# Patient Record
Sex: Female | Born: 1965 | Race: Black or African American | Hispanic: No | Marital: Married | Smoking: Former smoker
Health system: Southern US, Community
[De-identification: ages and names within clinical notes are randomized; demographics above are authoritative.]

## PROBLEM LIST (undated history)

## (undated) DIAGNOSIS — K219 Gastro-esophageal reflux disease without esophagitis: Secondary | ICD-10-CM

## (undated) DIAGNOSIS — M1990 Unspecified osteoarthritis, unspecified site: Secondary | ICD-10-CM

## (undated) DIAGNOSIS — I10 Essential (primary) hypertension: Secondary | ICD-10-CM

## (undated) DIAGNOSIS — E785 Hyperlipidemia, unspecified: Secondary | ICD-10-CM

## (undated) DIAGNOSIS — T884XXA Failed or difficult intubation, initial encounter: Secondary | ICD-10-CM

## (undated) HISTORY — PX: LUMBAR SPINE SURGERY: SHX701

## (undated) HISTORY — PX: BACK SURGERY: SHX140

## (undated) HISTORY — DX: Gastro-esophageal reflux disease without esophagitis: K21.9

## (undated) HISTORY — DX: Hyperlipidemia, unspecified: E78.5

## (undated) HISTORY — DX: Unspecified osteoarthritis, unspecified site: M19.90

## (undated) HISTORY — DX: Failed or difficult intubation, initial encounter: T88.4XXA

---

## 1993-08-18 HISTORY — PX: FINGER SURGERY: SHX640

## 1993-08-18 HISTORY — PX: HAND SURGERY: SHX662

## 2001-01-12 ENCOUNTER — Other Ambulatory Visit: Admission: RE | Admit: 2001-01-12 | Discharge: 2001-01-12 | Payer: Self-pay | Admitting: Obstetrics and Gynecology

## 2002-08-01 ENCOUNTER — Encounter: Payer: Self-pay | Admitting: Obstetrics and Gynecology

## 2002-08-01 ENCOUNTER — Ambulatory Visit (HOSPITAL_COMMUNITY): Admission: RE | Admit: 2002-08-01 | Discharge: 2002-08-01 | Payer: Self-pay | Admitting: Obstetrics and Gynecology

## 2002-09-13 ENCOUNTER — Encounter (HOSPITAL_COMMUNITY): Admission: RE | Admit: 2002-09-13 | Discharge: 2002-10-13 | Payer: Self-pay | Admitting: Neurosurgery

## 2005-12-16 ENCOUNTER — Ambulatory Visit: Payer: Self-pay | Admitting: Pain Medicine

## 2005-12-22 ENCOUNTER — Ambulatory Visit: Payer: Self-pay | Admitting: Pain Medicine

## 2005-12-24 ENCOUNTER — Ambulatory Visit (HOSPITAL_COMMUNITY): Admission: RE | Admit: 2005-12-24 | Discharge: 2005-12-24 | Payer: Self-pay | Admitting: Family Medicine

## 2006-01-22 ENCOUNTER — Ambulatory Visit: Payer: Self-pay | Admitting: Pain Medicine

## 2006-01-26 ENCOUNTER — Ambulatory Visit: Payer: Self-pay | Admitting: Pain Medicine

## 2006-03-03 ENCOUNTER — Ambulatory Visit: Payer: Self-pay | Admitting: Pain Medicine

## 2006-03-04 ENCOUNTER — Ambulatory Visit: Payer: Self-pay | Admitting: Pain Medicine

## 2006-04-14 ENCOUNTER — Ambulatory Visit: Payer: Self-pay | Admitting: Pain Medicine

## 2006-04-15 ENCOUNTER — Ambulatory Visit: Payer: Self-pay | Admitting: Pain Medicine

## 2006-05-28 ENCOUNTER — Ambulatory Visit: Payer: Self-pay | Admitting: Pain Medicine

## 2006-06-08 ENCOUNTER — Ambulatory Visit: Payer: Self-pay | Admitting: Pain Medicine

## 2007-11-03 ENCOUNTER — Ambulatory Visit (HOSPITAL_COMMUNITY): Admission: RE | Admit: 2007-11-03 | Discharge: 2007-11-03 | Payer: Self-pay | Admitting: Family Medicine

## 2009-10-16 ENCOUNTER — Emergency Department (HOSPITAL_COMMUNITY): Admission: EM | Admit: 2009-10-16 | Discharge: 2009-10-16 | Payer: Self-pay | Admitting: Emergency Medicine

## 2011-05-26 ENCOUNTER — Encounter: Payer: Self-pay | Admitting: Emergency Medicine

## 2011-05-26 ENCOUNTER — Emergency Department (HOSPITAL_COMMUNITY)
Admission: EM | Admit: 2011-05-26 | Discharge: 2011-05-26 | Disposition: A | Discharge status: Home / Self Care | Payer: PRIVATE HEALTH INSURANCE | Attending: Emergency Medicine | Admitting: Emergency Medicine

## 2011-05-26 ENCOUNTER — Emergency Department (HOSPITAL_COMMUNITY): Payer: PRIVATE HEALTH INSURANCE

## 2011-05-26 DIAGNOSIS — S93402A Sprain of unspecified ligament of left ankle, initial encounter: Secondary | ICD-10-CM

## 2011-05-26 DIAGNOSIS — M79609 Pain in unspecified limb: Secondary | ICD-10-CM | POA: Insufficient documentation

## 2011-05-26 DIAGNOSIS — X500XXA Overexertion from strenuous movement or load, initial encounter: Secondary | ICD-10-CM | POA: Insufficient documentation

## 2011-05-26 DIAGNOSIS — S93409A Sprain of unspecified ligament of unspecified ankle, initial encounter: Secondary | ICD-10-CM | POA: Insufficient documentation

## 2011-05-26 HISTORY — DX: Essential (primary) hypertension: I10

## 2011-05-26 NOTE — ED Provider Notes (Signed)
History     CSN: [id number] Arrival date & time: 05/26/2011  2:06 PM  Chief Complaint  Patient presents with  . Foot Pain    (Consider location/radiation/quality/duration/timing/severity/associated sxs/prior treatment) HPI Comments: Pt was walking downstairs and inverted L ankle yest.  No other injuries.  The history is provided by the patient. No language interpreter was used.    Past Medical History  Diagnosis Date  . Hypertension     History reviewed. No pertinent past surgical history.  No family history on file.  History  Substance Use Topics  . Smoking status: Never Smoker   . Smokeless tobacco: Not on file  . Alcohol Use: No    OB History    Grav Para Term Preterm Abortions TAB SAB Ect Mult Living                  Review of Systems  Musculoskeletal: Positive for arthralgias.  All other systems reviewed and are negative.    Allergies  Review of patient's allergies indicates no known allergies.  Home Medications   Current Outpatient Rx  Name Route Sig Dispense Refill  . AMITRIPTYLINE HCL 100 MG PO TABS Oral Take 50-100 mg by mouth at bedtime.      Marland Kitchen DILTIAZEM HCL CR 240 MG PO CP24 Oral Take 240 mg by mouth daily.      . ENALAPRIL MALEATE 10 MG PO TABS Oral Take 10 mg by mouth daily.      Marland Kitchen METOPROLOL TARTRATE 50 MG PO TABS Oral Take 50 mg by mouth 2 (two) times daily.      Marland Kitchen NAPROXEN 500 MG PO TABS Oral Take 500 mg by mouth 2 (two) times daily as needed. For pain       BP 135/87  Pulse 91  Temp(Src) 98.9 F (37.2 C) (Oral)  Resp 20  Ht 5\' 2"  (1.575 m)  Wt 175 lb (79.379 kg)  BMI 32.01 kg/m2  SpO2 99%  LMP 05/23/2011  Physical Exam  Nursing note and vitals reviewed. Constitutional: She is oriented to person, place, and time. Vital signs are normal. She appears well-developed and well-nourished. No distress.  HENT:  Head: Normocephalic and atraumatic.  Right Ear: External ear normal.  Left Ear: External ear normal.  Nose: Nose normal.    Mouth/Throat: No oropharyngeal exudate.  Eyes: Conjunctivae and EOM are normal. Pupils are equal, round, and reactive to light. Right eye exhibits no discharge. Left eye exhibits no discharge. No scleral icterus.  Neck: Normal range of motion. Neck supple. No JVD present. No tracheal deviation present. No thyromegaly present.  Cardiovascular: Normal rate, regular rhythm, normal heart sounds, intact distal pulses and normal pulses.  Exam reveals no gallop and no friction rub.   No murmur heard. Pulmonary/Chest: Effort normal and breath sounds normal. No stridor. No respiratory distress. She has no wheezes. She has no rales. She exhibits no tenderness.  Abdominal: Soft. Normal appearance and bowel sounds are normal. She exhibits no distension and no mass. There is no tenderness. There is no rebound and no guarding.  Musculoskeletal: She exhibits tenderness. She exhibits no edema.       Left ankle: She exhibits decreased range of motion and swelling. She exhibits no laceration and normal pulse. tenderness. Lateral malleolus tenderness found. Achilles tendon normal. Achilles tendon exhibits no pain, no defect and normal Thompson's test results.       Feet:  Lymphadenopathy:    She has no cervical adenopathy.  Neurological: She is alert and oriented  to person, place, and time. She has normal reflexes. Coordination normal. GCS eye subscore is 4. GCS verbal subscore is 5. GCS motor subscore is 6.  Skin: Skin is warm and dry. No rash noted. She is not diaphoretic.  Psychiatric: She has a normal mood and affect. Her speech is normal and behavior is normal. Judgment and thought content normal. Cognition and memory are normal.    ED Course  Procedures (including critical care time)  Labs Reviewed - No data to display Dg Ankle Complete Left  05/26/2011  *RADIOLOGY REPORT*  Clinical Data: Left ankle pain, twist injury last night  LEFT ANKLE COMPLETE - 3+ VIEW  Comparison: None  Findings: Lateral soft  tissue swelling. Ankle mortise intact. No acute fracture, dislocation, or bone destruction. Mineralization normal. Small plantar calcaneal spur.  IMPRESSION: No acute osseous abnormalities. Small plantar calcaneal spur.  Original Report Authenticated By: Lollie Marrow, M.D.     No diagnosis found.    MDM          Worthy Rancher, PA 05/26/11 1538

## 2011-05-26 NOTE — ED Notes (Signed)
States she twisted L ankle last night. Swelling noted.

## 2011-05-28 NOTE — ED Provider Notes (Signed)
Medical screening examination/treatment/procedure(s) were performed by non-physician practitioner and as supervising physician I was immediately available for consultation/collaboration.  Doug Sou, MD 05/28/11 1145

## 2012-01-30 ENCOUNTER — Other Ambulatory Visit (HOSPITAL_COMMUNITY): Payer: Self-pay | Admitting: Family Medicine

## 2012-01-30 DIAGNOSIS — Z139 Encounter for screening, unspecified: Secondary | ICD-10-CM

## 2012-02-02 ENCOUNTER — Inpatient Hospital Stay (HOSPITAL_COMMUNITY): Admission: RE | Admit: 2012-02-02 | Payer: PRIVATE HEALTH INSURANCE | Source: Ambulatory Visit

## 2012-12-15 ENCOUNTER — Emergency Department (HOSPITAL_COMMUNITY)
Admission: EM | Admit: 2012-12-15 | Discharge: 2012-12-15 | Disposition: A | Discharge status: Home / Self Care | Payer: Commercial Managed Care - PPO | Attending: Emergency Medicine | Admitting: Emergency Medicine

## 2012-12-15 ENCOUNTER — Encounter (HOSPITAL_COMMUNITY): Payer: Self-pay

## 2012-12-15 DIAGNOSIS — M545 Low back pain, unspecified: Secondary | ICD-10-CM | POA: Insufficient documentation

## 2012-12-15 DIAGNOSIS — F17200 Nicotine dependence, unspecified, uncomplicated: Secondary | ICD-10-CM | POA: Insufficient documentation

## 2012-12-15 DIAGNOSIS — Z79899 Other long term (current) drug therapy: Secondary | ICD-10-CM | POA: Insufficient documentation

## 2012-12-15 DIAGNOSIS — Y939 Activity, unspecified: Secondary | ICD-10-CM | POA: Insufficient documentation

## 2012-12-15 DIAGNOSIS — IMO0002 Reserved for concepts with insufficient information to code with codable children: Secondary | ICD-10-CM | POA: Insufficient documentation

## 2012-12-15 DIAGNOSIS — I10 Essential (primary) hypertension: Secondary | ICD-10-CM | POA: Insufficient documentation

## 2012-12-15 DIAGNOSIS — X58XXXA Exposure to other specified factors, initial encounter: Secondary | ICD-10-CM | POA: Insufficient documentation

## 2012-12-15 DIAGNOSIS — S76911A Strain of unspecified muscles, fascia and tendons at thigh level, right thigh, initial encounter: Secondary | ICD-10-CM

## 2012-12-15 DIAGNOSIS — Y9289 Other specified places as the place of occurrence of the external cause: Secondary | ICD-10-CM | POA: Insufficient documentation

## 2012-12-15 MED ORDER — BACLOFEN 10 MG PO TABS: 10.0000 mg | ORAL_TABLET | Freq: Three times a day (TID) | ORAL | Status: AC

## 2012-12-15 MED ORDER — PREDNISONE 10 MG PO TABS: ORAL_TABLET | ORAL | Status: DC

## 2012-12-15 MED ORDER — HYDROCODONE-ACETAMINOPHEN 5-325 MG PO TABS: 1.0000 | ORAL_TABLET | ORAL | Status: DC | PRN

## 2012-12-15 MED ORDER — BACLOFEN 10 MG PO TABS: 10.0000 mg | ORAL_TABLET | Freq: Three times a day (TID) | ORAL | Status: DC

## 2012-12-15 NOTE — ED Notes (Signed)
Pt c/o pain in r inner thigh radiating up to r hip since Sunday.  Denies any injury.

## 2012-12-15 NOTE — ED Notes (Signed)
Pt resting in bed, lights turned down in exam room, offered something to drink.  Declined at this time.

## 2012-12-15 NOTE — ED Provider Notes (Signed)
History     CSN: [id number]  Arrival date & time 12/15/12  4098   First MD Initiated Contact with Patient 12/15/12 1001      Chief Complaint  Patient presents with  . Leg Pain    (Consider location/radiation/quality/duration/timing/severity/associated sxs/prior treatment) Patient is a 47 y.o. female presenting with leg pain. The history is provided by the patient.  Leg Pain Location:  Leg Time since incident:  3 days Injury: no   Leg location:  R leg Pain details:    Quality:  Aching and sharp   Radiates to:  Back   Severity:  Moderate   Onset quality:  Gradual   Duration:  3 days   Timing:  Constant   Progression:  Worsening Chronicity:  Recurrent Dislocation: no   Foreign body present:  No foreign bodies Prior injury to area:  No Relieved by:  Nothing Worsened by:  Bearing weight Ineffective treatments:  NSAIDs Associated symptoms: back pain   Associated symptoms: no neck pain     Past Medical History  Diagnosis Date  . Hypertension     History reviewed. No pertinent past surgical history.  No family history on file.  History  Substance Use Topics  . Smoking status: Current Every Day Smoker    Types: Cigarettes  . Smokeless tobacco: Not on file  . Alcohol Use: No    OB History   Grav Para Term Preterm Abortions TAB SAB Ect Mult Living                  Review of Systems  Constitutional: Negative for activity change.       All ROS Neg except as noted in HPI  HENT: Negative for nosebleeds and neck pain.   Eyes: Negative for photophobia and discharge.  Respiratory: Negative for cough, shortness of breath and wheezing.   Cardiovascular: Negative for chest pain and palpitations.  Gastrointestinal: Negative for abdominal pain and blood in stool.  Genitourinary: Negative for dysuria, frequency and hematuria.  Musculoskeletal: Positive for back pain. Negative for arthralgias.  Skin: Negative.   Neurological: Negative for dizziness, seizures and  speech difficulty.  Psychiatric/Behavioral: Negative for hallucinations and confusion.    Allergies  Review of patient's allergies indicates no known allergies.  Home Medications   Current Outpatient Rx  Name  Route  Sig  Dispense  Refill  . amitriptyline (ELAVIL) 100 MG tablet   Oral   Take 100 mg by mouth at bedtime.          Marland Kitchen diltiazem (DILACOR XR) 240 MG 24 hr capsule   Oral   Take 240 mg by mouth daily.           . enalapril (VASOTEC) 10 MG tablet   Oral   Take 10 mg by mouth daily.           . metoprolol (LOPRESSOR) 50 MG tablet   Oral   Take 50 mg by mouth 2 (two) times daily.             BP 174/101  Pulse 99  Temp(Src) 98.2 F (36.8 C) (Oral)  Resp 17  SpO2 100%  LMP 10/15/2012  Physical Exam  Nursing note and vitals reviewed. Constitutional: She is oriented to person, place, and time. She appears well-developed and well-nourished.  Non-toxic appearance.  HENT:  Head: Normocephalic.  Right Ear: Tympanic membrane and external ear normal.  Left Ear: Tympanic membrane and external ear normal.  Eyes: EOM and lids are normal. Pupils are  equal, round, and reactive to light.  Neck: Normal range of motion. Neck supple. Carotid bruit is not present.  Cardiovascular: Normal rate, regular rhythm, normal heart sounds, intact distal pulses and normal pulses.   Pulmonary/Chest: Breath sounds normal. No respiratory distress.  Abdominal: Soft. Bowel sounds are normal. There is no tenderness. There is no guarding.  Musculoskeletal: Normal range of motion.  Pain to palpation of the right inner thigh. No dislocation of the right hip. FROM of the right knee and ankle.  Mild to mod pain to the lower lumbar area. No hot areas noted.  Lymphadenopathy:       Head (right side): No submandibular adenopathy present.       Head (left side): No submandibular adenopathy present.    She has no cervical adenopathy.  Neurological: She is alert and oriented to person, place,  and time. She has normal strength. No cranial nerve deficit or sensory deficit. She exhibits normal muscle tone. Coordination normal.  Skin: Skin is warm and dry.  Psychiatric: She has a normal mood and affect. Her speech is normal.    ED Course  Procedures (including critical care time)  Labs Reviewed - No data to display No results found.   No diagnosis found.    MDM  I have reviewed nursing notes, vital signs, and all appropriate lab and imaging results for this patient. Pt reports pain in the right inner thigh and right hip for 3 days. 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 No gross neuro deficit noted. Plan - Rx for baclofen, prednisone, and norco. Pt to apply heat. She will see her primary MD for follow up if not improving.       Kathie Dike, PA-C 12/15/12 1141

## 2012-12-15 NOTE — ED Provider Notes (Signed)
Medical screening examination/treatment/procedure(s) were performed by non-physician practitioner and as supervising physician I was immediately available for consultation/collaboration.    Vida Roller, MD 12/15/12 [id number]

## 2012-12-15 NOTE — ED Notes (Signed)
Pt reports right inner thigh pain for several days, denies any known injury, pain radiates to right hip.  Is also out of her bp meds due to not having  her pmd.

## 2012-12-29 ENCOUNTER — Ambulatory Visit
Admission: RE | Admit: 2012-12-29 | Discharge: 2012-12-29 | Disposition: A | Discharge status: Home / Self Care | Payer: Commercial Managed Care - PPO | Source: Ambulatory Visit | Attending: Physician Assistant | Admitting: Physician Assistant

## 2012-12-29 ENCOUNTER — Encounter: Payer: Self-pay | Admitting: Physician Assistant

## 2012-12-29 ENCOUNTER — Ambulatory Visit (INDEPENDENT_AMBULATORY_CARE_PROVIDER_SITE_OTHER): Payer: Commercial Managed Care - PPO | Admitting: Physician Assistant

## 2012-12-29 VITALS — BP 204/116 | HR 96 | Temp 98.4°F | Resp 20 | Ht 61.0 in | Wt 172.0 lb

## 2012-12-29 DIAGNOSIS — M25559 Pain in unspecified hip: Secondary | ICD-10-CM

## 2012-12-29 DIAGNOSIS — M545 Low back pain: Secondary | ICD-10-CM

## 2012-12-29 DIAGNOSIS — M1990 Unspecified osteoarthritis, unspecified site: Secondary | ICD-10-CM | POA: Insufficient documentation

## 2012-12-29 DIAGNOSIS — M25551 Pain in right hip: Secondary | ICD-10-CM

## 2012-12-29 DIAGNOSIS — I10 Essential (primary) hypertension: Secondary | ICD-10-CM

## 2012-12-29 DIAGNOSIS — G4700 Insomnia, unspecified: Secondary | ICD-10-CM

## 2012-12-29 DIAGNOSIS — F17200 Nicotine dependence, unspecified, uncomplicated: Secondary | ICD-10-CM

## 2012-12-29 MED ORDER — METOPROLOL TARTRATE 50 MG PO TABS: 50.0000 mg | ORAL_TABLET | Freq: Two times a day (BID) | ORAL | Status: DC

## 2012-12-29 MED ORDER — AMLODIPINE BESYLATE 5 MG PO TABS: 5.0000 mg | ORAL_TABLET | Freq: Every day | ORAL | Status: DC

## 2012-12-29 MED ORDER — ENALAPRIL MALEATE 10 MG PO TABS: 10.0000 mg | ORAL_TABLET | Freq: Every day | ORAL | Status: DC

## 2012-12-29 MED ORDER — ZOLPIDEM TARTRATE 5 MG PO TABS: 5.0000 mg | ORAL_TABLET | Freq: Every evening | ORAL | Status: DC | PRN

## 2012-12-29 NOTE — Progress Notes (Signed)
Called in RX d/t to error whilel processing escribe

## 2012-12-29 NOTE — Progress Notes (Signed)
Patient ID: Donna Fitzgerald MRN: [id number], DOB: 12-29-1965, 47 y.o. Date of Encounter: @DATE @  Chief Complaint:  Chief Complaint  Patient presents with  . new pt  needs estab care  work PE    HPI: 47 y.o. year old female  Presents to establish care at this practice. She needs to address the following:  1: HTN: She has a h/o HTN and was on multiple meds for this but all medications ran out 2 months ago.  2. Low Back Pain: went to ER 12/15/12 sec to pain in right thigh. Says they did no XRay. Treated with prednisone and pain pill. Says it was some better while on pain pill but o/w has continued to have symptoms. Says she had no trauma or injury at the time of onset of symptoms. States that she has a sensation in medial right upper thigh as if bugs crawling on hte skin. At times also feels a discomfort from right thigh to right low back.  **Works as Lawyer with patient care** 3. Insomnia; States she has difficulty falling asleep. Once she is asleep, has no problems staying asleep. Was using Amitryptilline for this but it is not helping.  4. Smoker: Has decreased to 2 cigarettes per week.    Past Medical History  Diagnosis Date  . Arthritis   . Hypertension     SHE HAS BEEN OFF ALL MEDS FOR 2 MONTHS  Home Meds: See attached medication section for current medication list. Any medications entered into computer today will not appear on this note's list. The medications listed below were entered prior to today. Current Outpatient Prescriptions on File Prior to Visit  Medication Sig Dispense Refill  .      .      .       No current facility-administered medications on file prior to visit.     Allergies: No Known Allergies  Works as Lawyer.  History   Social History  . Marital Status: Divorced    Spouse Name: N/A    Number of Children: N/A  . Years of Education: N/A   Occupational History  . Not on file.   Social History Main Topics  . Smoking status: Current Every Day Smoker  -- 0.50 packs/day    Types: Cigarettes  . Smokeless tobacco: Never Used  . Alcohol Use: No  . Drug Use: No  . Sexually Active: Not on file   Other Topics Concern  . Not on file   Social History Narrative  . No narrative on file    Family History  Problem Relation Age of Onset  . Hypertension Mother   . Heart disease Father   . Hypertension Father   . Heart disease Sister   . Hypertension Sister      Review of Systems:  See HPI for pertinent ROS. All other ROS negative.    Physical Exam: Blood pressure 204/116, pulse 96, temperature 98.4 F (36.9 C), temperature source Oral, resp. rate 20, height 5\' 1"  (1.549 m), weight 172 lb (78.019 kg), last menstrual period 10/15/2012., Body mass index is 32.52 kg/(m^2). General:Obese AAF. Appears in no acute distress. Neck: Supple. No thyromegaly. No lymphadenopathy. Lungs: Clear bilaterally to auscultation without wheezes, rales, or rhonchi. Breathing is unlabored. Heart: RRR with S1 S2. No murmurs, rubs, or gallops. Musculoskeletal:  Strength and tone normal for age.  Right Low Back with no pain with palpation. No pain with palpation of sciatic notch.  Bilateral SLR and Hip Abduction normal. Patella  DTRs 2+, equal bilaterally.  Extremities/Skin: Warm and dry. No clubbing or cyanosis. No edema. No rashes or suspicious lesions. Neuro: Alert and oriented X 3. Moves all extremities spontaneously. Gait is normal. CNII-XII grossly in tact. Psych:  Responds to questions appropriately with a normal affect.     ASSESSMENT AND PLAN:  47 y.o. year old female with  1. Hypertension She was on enalapril, lopressor, and diltiazem in past. Will resume enalapril and lopressor but change diltiazem to norvasc. RTC to recheck BP and BMET in 2 weeks on meds.  - enalapril (VASOTEC) 10 MG tablet; Take 1 tablet (10 mg total) by mouth daily.  Dispense: 30 tablet; Refill: 2 - metoprolol (LOPRESSOR) 50 MG tablet; Take 1 tablet (50 mg total) by mouth 2 (two)  times daily.  Dispense: 60 tablet; Refill: 2 - amLODipine (NORVASC) 5 MG tablet; Take 1 tablet (5 mg total) by mouth daily.  Dispense: 90 tablet; Refill: 3  2. Insomnia - zolpidem (AMBIEN) 5 MG tablet; Take 1 tablet (5 mg total) by mouth at bedtime as needed for sleep.  Dispense: 15 tablet; Refill: 1  3. Smoker Cont cessation  4. Low back pain - DG Lumbar Spine Complete; Future  5. Right hip pain - DG Hip Complete Right; Future  Will obtain XRays of lumbar spine and right hip. Will f/u with pt regarding this issue when XRay results available.   [address] [place], [place], [facility] 12/29/2012 1:07 PM

## 2012-12-30 ENCOUNTER — Telehealth: Payer: Self-pay | Admitting: Family Medicine

## 2012-12-30 DIAGNOSIS — I10 Essential (primary) hypertension: Secondary | ICD-10-CM

## 2012-12-30 MED ORDER — AMLODIPINE BESYLATE 5 MG PO TABS: 5.0000 mg | ORAL_TABLET | Freq: Every day | ORAL | Status: DC

## 2012-12-30 MED ORDER — MELOXICAM 7.5 MG PO TABS: 7.5000 mg | ORAL_TABLET | Freq: Two times a day (BID) | ORAL | Status: DC

## 2012-12-30 NOTE — Telephone Encounter (Signed)
Message copied by Donne Anon on Thu Dec 30, 2012  2:46 PM ------      Message from: Allayne Butcher      Created: Wed Dec 29, 2012  4:53 PM       Hip XRay normal. XRay of lumbar spine shows findings c/w "wear and tear."-Degenerative Disk Disease. But no more severe findings. At the OV I told her I would wait to get XRay findings then Rx med for her symptoms.       Will use Mobic 7.5mg  bid with food.       Send in RX for this- # 60 / 1 refill. ------

## 2012-12-30 NOTE — Telephone Encounter (Signed)
Pt made aware of xray results.  Told to take Mobic as ordered.  She states her pharmacy never received Norvasc order.  Mobic and Norvasc order sent to pharmacy.  Pt does have follow up appt for 2 weeks

## 2013-01-05 ENCOUNTER — Ambulatory Visit: Payer: Self-pay | Admitting: Physician Assistant

## 2013-01-19 ENCOUNTER — Encounter: Payer: Self-pay | Admitting: Physician Assistant

## 2013-01-19 ENCOUNTER — Ambulatory Visit (INDEPENDENT_AMBULATORY_CARE_PROVIDER_SITE_OTHER): Payer: Commercial Managed Care - PPO | Admitting: Physician Assistant

## 2013-01-19 VITALS — BP 174/110 | HR 100 | Temp 98.1°F | Resp 18 | Wt 172.0 lb

## 2013-01-19 DIAGNOSIS — G4700 Insomnia, unspecified: Secondary | ICD-10-CM

## 2013-01-19 DIAGNOSIS — M545 Low back pain: Secondary | ICD-10-CM

## 2013-01-19 DIAGNOSIS — I10 Essential (primary) hypertension: Secondary | ICD-10-CM

## 2013-01-19 MED ORDER — ENALAPRIL MALEATE 20 MG PO TABS: 20.0000 mg | ORAL_TABLET | Freq: Every day | ORAL | Status: DC

## 2013-01-19 MED ORDER — ZOLPIDEM TARTRATE 10 MG PO TABS: 10.0000 mg | ORAL_TABLET | Freq: Every evening | ORAL | Status: DC | PRN

## 2013-01-19 MED ORDER — AMLODIPINE BESYLATE 10 MG PO TABS: 10.0000 mg | ORAL_TABLET | Freq: Every day | ORAL | Status: AC

## 2013-01-19 NOTE — Progress Notes (Signed)
Patient ID: Donna Fitzgerald MRN: [id number], DOB: [birth year], 47 y.o. Date of Encounter: @DATE @  Chief Complaint:  Chief Complaint  Patient presents with  . recheck BP    HPI: 47 y.o. year old female  presents for 2 week f/u.  She was seen as new patient here 46/14/2014. She is here to f/u the following issues that were addressed at that OV:  1- HTN: At the visit 12/29/12 she reported that she had a h/o HTN and had been on 3 meds for this. Howver, had run out of all meds 2 months prior to that OV.  At that OV BP was 204/116. Started Enalapril 10mg  QD, Norvasc 5mg  QD, Metoprolol 50 BID.  Today she reports that she has been taking all meds as directed. She takes meds at 7:30 a.m. Except today has not taken meds yet. Did take them yesterday at 7:30 am (plus night dose of metoprolol).  Now 9:00 am.  2- Insomnia: Tried Ambien 5 mg--says it didn't work. Saw no effect. No adv effect either.   Has old bottle of Amitriptylline 100mg  tabs --she takes 1/2 (50mg ) prn insomnia. No h/o anxiety or depression. Strictly uses this prn insomnia. This does work for her.  3- Hip and Low Back Pain: At LOV I ordered Hip and Lumbar spine XRays. Hip XRAy was normal. Lumbar spine XRay showed DDD. No other signif abn. I then ordered Mobic. She says this has helped. THe  symptoms in her thigh/leg have resolved and the low back pain is better but not completely resolved.    Past Medical History  Diagnosis Date  . Arthritis   . Hypertension      Home Meds: See attached medication section for current medication list. Any medications entered into computer today will not appear on this note's list. The medications listed below were entered prior to today. Current Outpatient Prescriptions on File Prior to Visit  Medication Sig Dispense Refill  . acetaminophen (TYLENOL) 650 MG CR tablet Take 650 mg by mouth every 8 (eight) hours as needed for pain. Takes two      . amitriptyline (ELAVIL) 100 MG tablet Take 100 mg by  mouth at bedtime.       Marland Kitchen HYDROcodone-acetaminophen (NORCO/VICODIN) 5-325 MG per tablet Take 1 tablet by mouth every 4 (four) hours as needed for pain.  20 tablet  0  . meloxicam (MOBIC) 7.5 MG tablet Take 1 tablet (7.5 mg total) by mouth 2 (two) times daily with a meal.  60 tablet  1  . metoprolol (LOPRESSOR) 50 MG tablet Take 1 tablet (50 mg total) by mouth 2 (two) times daily.  60 tablet  2   No current facility-administered medications on file prior to visit.    Allergies: No Known Allergies  History   Social History  . Marital Status: Divorced    Spouse Name: N/A    Number of Children: N/A  . Years of Education: N/A   Occupational History  . Not on file.   Social History Main Topics  . Smoking status: Current Every Day Smoker -- 0.50 packs/day    Types: Cigarettes  . Smokeless tobacco: Never Used  . Alcohol Use: No  . Drug Use: No  . Sexually Active: Not on file   Other Topics Concern  . Not on file   Social History Narrative  . No narrative on file    Family History  Problem Relation Age of Onset  . Hypertension Mother   . Heart disease  Father   . Hypertension Father   . Heart disease Sister   . Hypertension Sister      Review of Systems:  See HPI for pertinent ROS. All other ROS negative.    Physical Exam: Blood pressure 174/110, pulse 100, temperature 98.1 F (36.7 C), temperature source Oral, resp. rate 18, weight 172 lb (78.019 kg)., Body mass index is 32.52 kg/(m^2). Repeat BP By Me on Left is: 160/104 General:Mild obese AAF.  Appears in no acute distress. Neck: Supple. No thyromegaly. No lymphadenopathy.No Carotid Bruits. Lungs: Clear bilaterally to auscultation without wheezes, rales, or rhonchi. Breathing is unlabored. Heart: RRR with S1 S2. No murmurs, rubs, or gallops. Musculoskeletal:  Strength and tone normal for age. Extremities/Skin: Warm and dry. No clubbing or cyanosis. No edema. No rashes or suspicious lesions. Neuro: Alert and oriented  X 3. Moves all extremities spontaneously. Gait is normal. CNII-XII grossly in tact. Psych:  Responds to questions appropriately with a normal affect.   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  ASSESSMENT AND PLAN:  47 y.o. year old female with  1. Hypertension Will increase Norvasc to 10mg  QD and  increase Enalapril to 20mg  QD.  Cont Metoprolol at 50mg  BID RTC 2 weeks to recheck BP and to check BMET.  2. Insomnia She wants to try higher dose of ambien. - zolpidem (AMBIEN) 10 MG tablet; Take 1 tablet (10 mg total) by mouth at bedtime as needed for sleep.  Dispense: 15 tablet; Refill: 1  3. Low back pain Cont Mobic. She is not stretching. Told her to start stretching low back-discussed specific stretches.    Signed, [address] [place], [place], [facility] 01/19/2013 9:06 AM

## 2013-02-07 ENCOUNTER — Encounter: Payer: Self-pay | Admitting: Physician Assistant

## 2013-02-07 ENCOUNTER — Ambulatory Visit (INDEPENDENT_AMBULATORY_CARE_PROVIDER_SITE_OTHER): Payer: Commercial Managed Care - PPO | Admitting: Physician Assistant

## 2013-02-07 VITALS — BP 132/90 | HR 64 | Temp 98.4°F | Resp 18 | Wt 170.0 lb

## 2013-02-07 DIAGNOSIS — I10 Essential (primary) hypertension: Secondary | ICD-10-CM

## 2013-02-07 DIAGNOSIS — G4700 Insomnia, unspecified: Secondary | ICD-10-CM

## 2013-02-07 LAB — BASIC METABOLIC PANEL WITH GFR
BUN: 16 mg/dL (ref 6–23)
Calcium: 9.8 mg/dL (ref 8.4–10.5)
GFR, Est African American: >89 mL/min
GFR, Est Non African American: >89 mL/min
Potassium: 4.8 mEq/L (ref 3.5–5.3)
Sodium: 138 mEq/L (ref 135–145)

## 2013-02-07 MED ORDER — AMITRIPTYLINE HCL 100 MG PO TABS: 100.0000 mg | ORAL_TABLET | Freq: Every day | ORAL | Status: DC

## 2013-02-07 NOTE — Progress Notes (Signed)
Patient ID: Donna Fitzgerald MRN: [id number], DOB: 10-13-1965, 47 y.o. Date of Encounter: 02/07/2013, 7:17 PM    Chief Complaint:  Chief Complaint  Patient presents with  . 2 wk f/u for BP     HPI: 47 y.o. year old AAF female presents for f/u of HTN.  At OV here 12/29/12 she reported h/o HTN and that she had been on 3 BP meds for this. However, she had run out of all of her medications 2 months prior to that OV.  At that OV 12/29/12 her BP was 204/116. Started Enalapril 10mg  QD, Norvasc 5mg  QD, Metoprolol 50mg  BID.  She took all meds as directed.  At f/u OV 01/19/13 BP was 174/110 by Selena Batten, 160/104 by me. Norvasc was increased to 10mg . Enalapril was increased to 20mg  QD. Continued Metoprolol 50mg  BID. Today she states she is taking all meds as directed. No adverse effects.   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   Insomnia:  In past used Amitripylline and this worked well. Recently we tried Ambien but this has not worked as well. She wants to go back to the Amitryptyline.   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   Home Meds: See attached medication section for any medications that were entered at today's visit. The computer does not put those onto this list.The following list is a list of meds entered prior to today's visit.   Current Outpatient Prescriptions on File Prior to Visit  Medication Sig Dispense Refill  . acetaminophen (TYLENOL) 650 MG CR tablet Take 650 mg by mouth every 8 (eight) hours as needed for pain. Takes two      . amLODipine (NORVASC) 10 MG tablet Take 1 tablet (10 mg total) by mouth daily.  90 tablet  3  . enalapril (VASOTEC) 20 MG tablet Take 1 tablet (20 mg total) by mouth daily.  90 tablet  0  . HYDROcodone-acetaminophen (NORCO/VICODIN) 5-325 MG per tablet Take 1 tablet by mouth every 4 (four) hours as needed for pain.  20 tablet  0  . meloxicam (MOBIC) 7.5 MG tablet Take 1 tablet  (7.5 mg total) by mouth 2 (two) times daily with a meal.  60 tablet  1  . metoprolol (LOPRESSOR) 50 MG tablet Take 1 tablet (50 mg total) by mouth 2 (two) times daily.  60 tablet  2   No current facility-administered medications on file prior to visit.    Allergies: No Known Allergies    Review of Systems: See HPI for pertinent ROS. All other ROS negative.    Physical Exam: Blood pressure 132/90, pulse 64, temperature 98.4 F (36.9 C), temperature source Oral, resp. rate 18, weight 170 lb (77.111 kg)., Body mass index is 32.14 kg/(m^2). General: Mild obese AAF.  Appears in no acute distress. Lungs: Clear bilaterally to auscultation without wheezes, rales, or rhonchi. Breathing is unlabored. Heart: Regular rhythm. No murmurs, rubs, or gallops. Msk:  Strength and tone normal for age. Extremities/Skin: Warm and dry. No clubbing or cyanosis. No edema. No rashes or suspicious lesions. Neuro: Alert and oriented X 3. Moves all extremities spontaneously. Gait is normal. CNII-XII grossly in tact. Psych:  Responds to questions appropriately with a normal affect.     ASSESSMENT AND PLAN:  47 y.o. year old female with  1. Hypertension At Goal. Cont current meds. Check labs to monitor.  - BASIC METABOLIC PANEL WITH  GFR  2. Insomnia - amitriptyline (ELAVIL) 100 MG tablet; Take 1 tablet (100 mg total) by mouth at bedtime.  Dispense: 30 tablet; Refill: 5  F/U OV 3 months.   Murray Hodgkins [place], [place], [facility] 02/07/2013 7:17 PM

## 2013-02-09 ENCOUNTER — Encounter: Payer: Self-pay | Admitting: Family Medicine

## 2013-02-25 ENCOUNTER — Telehealth: Payer: Self-pay | Admitting: Physician Assistant

## 2013-02-25 MED ORDER — MELOXICAM 7.5 MG PO TABS: 7.5000 mg | ORAL_TABLET | Freq: Two times a day (BID) | ORAL | Status: DC

## 2013-02-25 NOTE — Telephone Encounter (Signed)
Medication refilled per protocol. 

## 2013-04-27 ENCOUNTER — Other Ambulatory Visit: Payer: Self-pay | Admitting: Physician Assistant

## 2013-05-01 ENCOUNTER — Other Ambulatory Visit: Payer: Self-pay | Admitting: Physician Assistant

## 2013-05-02 NOTE — Telephone Encounter (Signed)
Medication refilled per protocol. 

## 2013-05-11 ENCOUNTER — Encounter: Payer: Self-pay | Admitting: Physician Assistant

## 2013-05-11 ENCOUNTER — Ambulatory Visit (INDEPENDENT_AMBULATORY_CARE_PROVIDER_SITE_OTHER): Payer: Commercial Managed Care - PPO | Admitting: Physician Assistant

## 2013-05-11 VITALS — BP 142/90 | HR 96 | Temp 98.4°F | Resp 18 | Ht 61.5 in | Wt 175.0 lb

## 2013-05-11 DIAGNOSIS — Z309 Encounter for contraceptive management, unspecified: Secondary | ICD-10-CM

## 2013-05-11 DIAGNOSIS — M545 Low back pain: Secondary | ICD-10-CM

## 2013-05-11 MED ORDER — CYCLOBENZAPRINE HCL 10 MG PO TABS: 10.0000 mg | ORAL_TABLET | Freq: Three times a day (TID) | ORAL | Status: DC | PRN

## 2013-05-11 NOTE — Progress Notes (Signed)
Patient ID: ALLAN BACIGALUPI MRN: [id number], DOB: 06-Oct-1965, 47 y.o. Date of Encounter: 05/11/2013, 1:18 PM    Chief Complaint:  Chief Complaint  Patient presents with  . 3 mth check up    wants to discuss Depo shots     HPI: 47 y.o. year old AA female comes in today to discuss contraceptive options.    She says that she had used Depo-Provera many years ago and was interested in doing that again. Says that she has been told she cannot use oral contraceptives treatment because of her age and her smoking.  Says that she checked with her insurance regarding cost of the IUD that her out-of-pocket cost would be about $1000 and she cannot do this.   Says she had gone several months without a menstrual period but then she did have a menses last month has had another one this month. She had been on on no contraception and that was not an issue you until she is recently been involved in a new relationship so is now needing contraception.  Later in the conversation she also notes that the Mobic is not helping her back pain very much. Wants to know what else she can take. Says that she has some pain across her low back especially after working. No pain numbness or tingling down either leg. No weakness in either leg.  Home Meds: See attached medication section for any medications that were entered at today's visit. The computer does not put those onto this list.The following list is a list of meds entered prior to today's visit.   Current Outpatient Prescriptions on File Prior to Visit  Medication Sig Dispense Refill  . acetaminophen (TYLENOL) 650 MG CR tablet Take 650 mg by mouth every 8 (eight) hours as needed for pain. Takes two      . amitriptyline (ELAVIL) 100 MG tablet Take 1 tablet (100 mg total) by mouth at bedtime.  30 tablet  5  . amLODipine (NORVASC) 10 MG tablet Take 1 tablet (10 mg total) by mouth daily.  90 tablet  3  . enalapril (VASOTEC) 20 MG tablet TAKE 1 TABLET (20 MG TOTAL) BY  MOUTH DAILY.  90 tablet  0  . meloxicam (MOBIC) 7.5 MG tablet TAKE 1 TABLET (7.5 MG TOTAL) BY MOUTH 2 (TWO) TIMES DAILY WITH A MEAL.  60 tablet  1  . metoprolol (LOPRESSOR) 50 MG tablet Take 1 tablet (50 mg total) by mouth 2 (two) times daily.  60 tablet  2   No current facility-administered medications on file prior to visit.    Allergies: No Known Allergies    Review of Systems: See HPI for pertinent ROS. All other ROS negative.    Physical Exam: Blood pressure 142/90, pulse 96, temperature 98.4 F (36.9 C), temperature source Oral, resp. rate 18, height 5' 1.5" (1.562 m), weight 175 lb (79.379 kg), last menstrual period 05/04/2013., Body mass index is 32.53 kg/(m^2). General: Overweight African American female. Appears in no acute distress. Neck: Supple. No thyromegaly. No lymphadenopathy. Lungs: Clear bilaterally to auscultation without wheezes, rales, or rhonchi. Breathing is unlabored. Heart: Regular rhythm. No murmurs, rubs, or gallops. Abdomen: Soft, non-tender, non-distended with normoactive bowel sounds. No hepatomegaly. No rebound/guarding. No obvious abdominal masses. Msk:  Strength and tone normal for age. Extremities/Skin: Warm and dry. No clubbing or cyanosis. No edema. No rashes or suspicious lesions. Neuro: Alert and oriented X 3. Moves all extremities spontaneously. Gait is normal. CNII-XII grossly in tact. Psych:  Responds to questions appropriately with a normal affect.     ASSESSMENT AND PLAN:  47 y.o. year old female with  1. Contraception management I have discussed my concern with using any type of hormone as contraception. She has multiple risk factors including: Age Smoker Hypertension Family history:  Father with CABG at age 23                          Sister with CAD in her 61s and CVA in her 93s also  She says she can't ask him to do her Vasotec to me because they are not married. Discuss some type of barrier method.   2. Low back pain She had  x-ray of the lumbar spine does show degenerative disease changes. Continue heat and continue stretches and exercises to strengthen the muscles. Continue MOBIC as needed. Will admit muscle relaxer. - cyclobenzaprine (FLEXERIL) 10 MG tablet; Take 1 tablet (10 mg total) by mouth 3 (three) times daily as needed for muscle spasms.  Dispense: 30 tablet; Refill: 0   Signed, [address] [place], [place], [facility] 05/11/2013 1:18 PM

## 2013-06-03 ENCOUNTER — Other Ambulatory Visit: Payer: Self-pay | Admitting: Physician Assistant

## 2013-06-03 NOTE — Telephone Encounter (Signed)
Medication refilled per protocol. 

## 2013-06-03 NOTE — Telephone Encounter (Signed)
Approved for # 60 plus 2 additional refills

## 2013-06-03 NOTE — Telephone Encounter (Signed)
Ok to refill 

## 2013-06-03 NOTE — Telephone Encounter (Signed)
rx called in

## 2013-06-10 ENCOUNTER — Other Ambulatory Visit: Payer: Self-pay | Admitting: Physician Assistant

## 2013-06-10 NOTE — Telephone Encounter (Signed)
Medication refilled per protocol. 

## 2013-06-15 ENCOUNTER — Telehealth: Payer: Self-pay | Admitting: Physician Assistant

## 2013-06-15 NOTE — Telephone Encounter (Signed)
Patient brought by paper work to be signed . Please call to let her know when she can come by to pick it up.

## 2013-06-15 NOTE — Telephone Encounter (Signed)
Form ready pt aware to come pick up

## 2013-08-08 ENCOUNTER — Other Ambulatory Visit: Payer: Self-pay | Admitting: Physician Assistant

## 2013-08-09 NOTE — Telephone Encounter (Signed)
Medication refilled per protocol.Patient needs to be seen before any further refills 

## 2013-08-10 ENCOUNTER — Other Ambulatory Visit: Payer: Self-pay | Admitting: Physician Assistant

## 2013-08-12 NOTE — Telephone Encounter (Signed)
Medication refilled per protocol. 

## 2013-08-22 ENCOUNTER — Encounter: Payer: Self-pay | Admitting: Family Medicine

## 2013-10-05 ENCOUNTER — Other Ambulatory Visit: Payer: Self-pay | Admitting: Physician Assistant

## 2013-10-05 DIAGNOSIS — I10 Essential (primary) hypertension: Secondary | ICD-10-CM

## 2013-10-05 NOTE — Telephone Encounter (Signed)
Medication filled x1 with no refills. Requires office visit before any further refills can be given. Letter sent to patient to schedule appointment.

## 2013-10-28 ENCOUNTER — Other Ambulatory Visit: Payer: Self-pay | Admitting: Physician Assistant

## 2013-10-28 NOTE — Telephone Encounter (Signed)
Medication filled x1 with no refills.   Patient must contact office before further refills can be given.

## 2013-11-14 ENCOUNTER — Other Ambulatory Visit: Payer: Self-pay | Admitting: Physician Assistant

## 2013-11-14 NOTE — Telephone Encounter (Signed)
No more refills until patient contacts office

## 2013-12-17 ENCOUNTER — Other Ambulatory Visit: Payer: Self-pay | Admitting: Physician Assistant

## 2013-12-26 ENCOUNTER — Other Ambulatory Visit: Payer: Self-pay | Admitting: Physician Assistant

## 2013-12-27 ENCOUNTER — Other Ambulatory Visit: Payer: Self-pay | Admitting: Physician Assistant

## 2013-12-27 NOTE — Telephone Encounter (Signed)
Pt has been discharged from practice refill denied

## 2013-12-27 NOTE — Telephone Encounter (Signed)
Discharged patient.  Refill denied

## 2013-12-27 NOTE — Telephone Encounter (Signed)
Discharged patient, refill denied

## 2014-01-14 ENCOUNTER — Other Ambulatory Visit: Payer: Self-pay | Admitting: Physician Assistant

## 2014-01-16 NOTE — Telephone Encounter (Signed)
Pt has been discharged from practice, refills denied

## 2014-04-15 ENCOUNTER — Other Ambulatory Visit: Payer: Self-pay | Admitting: Physician Assistant

## 2014-04-15 NOTE — Telephone Encounter (Signed)
Patient discharged from practice on 01/25/2014.  Refill denied.

## 2014-05-16 ENCOUNTER — Other Ambulatory Visit (HOSPITAL_COMMUNITY): Payer: Self-pay | Admitting: Family Medicine

## 2014-05-16 DIAGNOSIS — Z139 Encounter for screening, unspecified: Secondary | ICD-10-CM

## 2014-05-24 ENCOUNTER — Ambulatory Visit (HOSPITAL_COMMUNITY)
Admission: RE | Admit: 2014-05-24 | Discharge: 2014-05-24 | Disposition: A | Discharge status: Home / Self Care | Payer: Commercial Managed Care - PPO | Source: Ambulatory Visit | Attending: Family Medicine | Admitting: Family Medicine

## 2014-05-24 DIAGNOSIS — Z1231 Encounter for screening mammogram for malignant neoplasm of breast: Secondary | ICD-10-CM | POA: Diagnosis present

## 2014-05-24 DIAGNOSIS — Z139 Encounter for screening, unspecified: Secondary | ICD-10-CM

## 2014-12-08 ENCOUNTER — Ambulatory Visit (HOSPITAL_COMMUNITY)
Admission: RE | Admit: 2014-12-08 | Discharge: 2014-12-08 | Disposition: A | Discharge status: Home / Self Care | Payer: Commercial Managed Care - PPO | Source: Ambulatory Visit | Attending: Family Medicine | Admitting: Family Medicine

## 2014-12-08 ENCOUNTER — Other Ambulatory Visit (HOSPITAL_COMMUNITY): Payer: Self-pay | Admitting: Family Medicine

## 2014-12-08 DIAGNOSIS — R05 Cough: Secondary | ICD-10-CM | POA: Insufficient documentation

## 2014-12-08 DIAGNOSIS — F17200 Nicotine dependence, unspecified, uncomplicated: Secondary | ICD-10-CM

## 2014-12-08 DIAGNOSIS — Z720 Tobacco use: Secondary | ICD-10-CM | POA: Insufficient documentation

## 2015-03-15 ENCOUNTER — Other Ambulatory Visit (HOSPITAL_COMMUNITY): Payer: Self-pay | Admitting: Family Medicine

## 2015-03-15 ENCOUNTER — Ambulatory Visit (HOSPITAL_COMMUNITY)
Admission: RE | Admit: 2015-03-15 | Discharge: 2015-03-15 | Disposition: A | Discharge status: Home / Self Care | Payer: Commercial Managed Care - PPO | Source: Ambulatory Visit | Attending: Nurse Practitioner | Admitting: Nurse Practitioner

## 2015-03-15 ENCOUNTER — Other Ambulatory Visit (HOSPITAL_COMMUNITY): Payer: Self-pay | Admitting: Nurse Practitioner

## 2015-03-15 DIAGNOSIS — M545 Low back pain: Secondary | ICD-10-CM | POA: Diagnosis not present

## 2015-03-15 DIAGNOSIS — M5440 Lumbago with sciatica, unspecified side: Secondary | ICD-10-CM

## 2015-03-15 DIAGNOSIS — M4806 Spinal stenosis, lumbar region: Secondary | ICD-10-CM | POA: Insufficient documentation

## 2015-03-21 ENCOUNTER — Ambulatory Visit: Payer: Commercial Managed Care - PPO | Attending: Family Medicine

## 2015-03-21 DIAGNOSIS — M5441 Lumbago with sciatica, right side: Secondary | ICD-10-CM

## 2015-03-21 DIAGNOSIS — R531 Weakness: Secondary | ICD-10-CM | POA: Insufficient documentation

## 2015-03-21 DIAGNOSIS — M545 Low back pain: Secondary | ICD-10-CM | POA: Insufficient documentation

## 2015-03-21 NOTE — Therapy (Signed)
[facility] [address] [place], [address] Phone: [phone]   Fax:  [phone]  Physical Therapy Evaluation  Patient Details  Name: Donna Fitzgerald MRN: [id number] Date of Birth: August 14, 1966 Referring Provider:  Gwenlyn Perking Mar*  Encounter Date: 03/21/2015      PT End of Session - 03/21/15 1024    Visit Number 1   Number of Visits 9   Date for PT Re-Evaluation 04/19/15   PT Start Time 1023   PT Stop Time 1134   PT Time Calculation (min) 71 min   Activity Tolerance Patient tolerated treatment well   Behavior During Therapy [facility] for tasks assessed/performed      Past Medical History  Diagnosis Date  . Arthritis   . Hypertension     Past Surgical History  Procedure Laterality Date  . Hand surgery Right 1995    There were no vitals filed for this visit.  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 Pt picked up someone while at work and turned the wrong way. Felt a pinched nerve in her R side. The back of her R thigh, and leg feels tingly and numb. Walking about half a mile increases R LE paresthesia. Had PT in 2002 for her back and R LE which helped a little bit. Performed exercises such as LAQ and clamshells. Pt stats that the aforementioned exercises did not really help. Had moist heat and electrical stimulation on her back during prior PT which helped. Does a lot of lifting at work. 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   Denies bowel or bladder problems or saddle anesthesia.  Feels like she might fall at times because her R leg feels numb.    Patient Stated Goals Pt expresses desire to walk about 1 mile every morning, play with her grand children.    Currently in Pain? No/denies   Pain Location Back  and R LE towards distal posterior leg   Pain Orientation Right   Pain Descriptors / Indicators Tingling;Numbness;Aching   Aggravating Factors  Walking, performing chores such as sweeping, or vacuuming, using the weed eater when mowing her lawn.    Multiple Pain Sites Yes  R LE            [facility] PT Assessment - 03/21/15 1039    Assessment   Medical Diagnosis Low back pain   Onset Date/Surgical Date 03/13/15  Date PT referral signed   Prior Therapy Had prior PT in 2002 which pt had some positive effects.    Precautions   Precaution Comments No known precautions   Restrictions   Other Position/Activity Restrictions No known restrictions   Balance Screen   Has the patient fallen in the past 6 months No   Has the patient had a decrease in activity level because of a fear of falling?  No   Is  the patient reluctant to leave their home because of a fear of falling?  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    Observation/Other Assessments   Observations (+) repeated lumbar flexion test with reproduction of R LE symptoms. Aberrant movement with returning to the neutral position from lumbar flexion. (-) R piriformis test, (+) long sit test suggesting posterior nutation of R innominate.    Modified Oswertry 18% (Minimal self perceived disability)   Posture/Postural Control   Posture Comments Movement crease around L4/5. Decreased bilateral hip  extension, bilaterally protracted shoulders, slight R trunk side bend, slight R trunk rotation.    AROM   Lumbar Flexion WFL with pain   Lumbar Extension limited with R trunk rotation (worse than flexion). Symptoms R low back and hip   Lumbar - Right Side Bend limited with symptoms   Lumbar - Left Side Bend limited   Lumbar - Right Rotation limited with R low back symptoms   Lumbar - Left Rotation limited   PROM   Overall PROM Comments R hip extension -4 degrees with R LE symptoms   Left Hip Extension -7   Strength   Right Hip Flexion 4-/5   Right Hip Extension 3+/5   Right Hip External Rotation  4   Right Hip ABduction 4/5   Left Hip Flexion 4-/5   Left Hip Extension 3/5   Left Hip External Rotation  4   Left Hip ABduction 4/5   Right Knee Flexion 4/5   Right Knee Extension 4+/5   Left Knee Flexion 4/5   Left Knee Extension 5/5   Palpation   Palpation comment TTP posterior R mid thigh. Decreased central P to A mobility to S1, L5, and lower to mid thoracic spine.    Ambulation/Gait   Gait Comments increased R trunk rotation          [facility] Adult PT Treatment/Exercise - 03/21/15 1039    Exercises   Other Exercises  Directed patient with supine R hip flexor stretch 3x 30 seconds,  supine bridge 10x3. Reviewed and gave aforementioned exercises as part of her HEP. Handouts provided (drawing provided for supine R hip flexor stretch). Pt demonstrated and verbalized understanding.           PT Education - 03/21/15 1921    Education provided Yes   Education Details plan of care, ther-ex, HEP   Person(s) Educated Patient   Methods Explanation;Demonstration;Verbal cues;Handout   Comprehension Verbalized understanding;Returned demonstration             PT Long Term Goals - 03/21/15 1923    PT LONG TERM GOAL #1   Title Patient will be independent with her HEP to improve ability to walk for longer periods, and improve ability to perform work responsibilities and house  chores.    Time 4   Period Weeks   Status New   PT LONG TERM GOAL #2   Title Patient will have a decrease in low back and R LE pain to 4/10 or less at worst to improve ability to perform work tasks and ability to ambulate for longer periods.    Time 4   Period Weeks   Status New   PT LONG TERM GOAL #3   Title Patient will improve her Modified Oswestry Low back Pain Disability questionnaire by  at least 10% as a demonstration of improved function.    Time 4   Period Weeks   Status New   PT LONG TERM GOAL #4   Title Patient will improve bilateral hip strength by at least 1/2 MMT grade to help decrease back and R LE pain and improve function.    Time 4   Period Weeks   Status New           Plan - 03/21/15 1146    Clinical Impression Statement Patient is a 49 year old female who came to physical therapy secondary to back pain with radiating symptoms to her R LE. She also presents with hip muscle weakness, decreased lumbar and thoracic mobility, slightly altered gait pattern, decreased hip extension ROM, reproduction of symptoms with lumbar flexion, extension, R side bend, R rotation; altered movement mechanics with back extension, and difficultly performing functional tasks such as walking, and lifting. Patient will benefit from skilled physical therapy intervention to address the aforementioned defiits.    Pt will benefit from skilled therapeutic intervention in order to improve on the following deficits Decreased range of motion;Difficulty walking;Pain;Improper body mechanics;Decreased strength   Rehab Potential Good   Clinical Impairments Affecting Rehab Potential heavy lifting at work   PT Frequency 2x / week   PT Duration 4 weeks   PT Treatment/Interventions Manual techniques;Therapeutic exercise;Therapeutic activities;Moist Heat;Electrical Stimulation;Iontophoresis /ml Dexamethasone;Cryotherapy;Patient/family education;Neuromuscular re-education   PT Next Visit Plan low back and  thoracic joint mobilization, hip extension ROM, glute max and med strengthening, thoracic extension, ergonomic lifting   Consulted and Agree with Plan of Care Patient         Problem List Patient Active Problem List   Diagnosis Date Noted  . Insomnia 01/19/2013  . Low back pain 01/19/2013  . Hypertension     Thank you for your referral.   Loralyn Freshwater PT, DPT  03/21/2015, 7:39 PM  [facility] SPORTS MEDICINE [address], [place], [address] Phone: [phone]   Fax:  [phone]

## 2015-03-21 NOTE — Patient Instructions (Addendum)
Bridge   Lying on back, legs bent 90, feet flat on floor. Squeeze rear end. Press up hips and torso. Perform 10 times for 3 sets.    Copyright  VHI. All rights reserved.   Also gave supine R hip flexor stretch (leg straight and glute squeeze) 30 seconds x 3 as part of her HEP. Pt demonstrated and verabalized understanding.    Improved exercise technique, movement at target joints, use of target muscles after mod verbal, visual, tactile cues.

## 2015-03-22 ENCOUNTER — Ambulatory Visit: Payer: Commercial Managed Care - PPO | Admitting: Physical Therapy

## 2015-03-22 ENCOUNTER — Ambulatory Visit: Payer: Commercial Managed Care - PPO

## 2015-03-26 ENCOUNTER — Ambulatory Visit: Payer: Commercial Managed Care - PPO

## 2015-03-26 DIAGNOSIS — M545 Low back pain: Secondary | ICD-10-CM | POA: Diagnosis not present

## 2015-03-26 DIAGNOSIS — M5441 Lumbago with sciatica, right side: Secondary | ICD-10-CM

## 2015-03-26 DIAGNOSIS — R531 Weakness: Secondary | ICD-10-CM

## 2015-03-26 NOTE — Therapy (Signed)
[facility] [address], [place], [address] Phone: [phone]   Fax:  [phone]  Physical Therapy Treatment  Patient Details  Name: KEYIRA MONDESIR MRN: [id number] Date of Birth: 01-21-1966 Referring Provider:  Gwenlyn Perking Mar*  Encounter Date: 03/26/2015      PT End of Session - 03/26/15 1123    Visit Number 2   Number of Visits 9   Date for PT Re-Evaluation 04/19/15   PT Start Time 1121   PT Stop Time 1209   PT Time Calculation (min) 48 min   Activity Tolerance Patient tolerated treatment well   Behavior During Therapy [facility] for tasks assessed/performed      Past Medical History  Diagnosis Date  . Arthritis   . Hypertension     Past Surgical History  Procedure Laterality Date  . Hand surgery Right 1995    There were no vitals filed for this visit.  Visit Diagnosis:  Bilateral low back pain, with sciatica presence unspecified  Weakness  Bilateral low back pain with right-sided sciatica      Subjective Assessment - 03/26/15 1123    Subjective Back is doing ok today. 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 The lifting usually bothers her back.    Patient Stated Goals Pt expresses desire to walk about 1 mile every morning, play with her grand children.    Currently in Pain? No/denies   Multiple Pain Sites No           [facility] Adult PT Treatment/Exercise - 03/26/15 1145    Exercises   Other Exercises  Directed patient with sitting with lumbar towel roll support 3 min, standing mini squats multiple times, seated R trunk rotation 10x, standing bilateral scapular retraction red band 10x2 with 5 second holds. 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  Pt demonstrated and verbalized understanding.    Manual Therapy   Manual therapy comments Prone P to A to R sacral ala and L5 transverse process grade 3 to 3+; then to L sacral ala, L transverse processes of L 5,4,2,1 grade 3 to 3+           PT Education - 03/26/15 1859    Education provided Yes   Education Details ther-ex, ergonomic: lifting, transfers, rolling a patient in bed.    Person(s) Educated Patient   Methods Explanation;Demonstration;Tactile cues;Verbal cues   Comprehension Verbalized understanding;Returned demonstration             PT Long Term Goals - 03/21/15 1923    PT LONG TERM GOAL #1   Title Patient will be independent with her HEP to improve ability to walk for longer periods, and improve ability to perform work responsibilities and house chores.    Time 4   Period Weeks   Status New   PT LONG TERM GOAL #2   Title Patient will have a decrease in low back and R LE pain to 4/10 or less at worst to improve ability to perform work tasks and ability to ambulate for longer periods.    Time 4   Period Weeks   Status New   PT LONG TERM GOAL #3   Title Patient will improve her Modified Oswestry  Low back Pain Disability questionnaire by at least 10% as a demonstration of improved function.    Time 4   Period Weeks   Status New   PT LONG TERM GOAL #4   Title Patient will improve bilateral hip strength by at least 1/2 MMT grade to help decrease back and R LE pain and improve function.    Time 4   Period Weeks   Status New               Plan - 03/26/15 1213    Clinical Impression Statement Pt demonstrates tendency for L trunk rotation when attempting to perform squats and ergonomic lifting. Improved neutral trunk positioning with squats after manual therapy to low back, especially to L side. Improved lumbar mobility after manual therapy.   Pt will benefit from skilled therapeutic intervention in order to improve on the following deficits Decreased range  of motion;Difficulty walking;Pain;Improper body mechanics;Decreased strength   Rehab Potential Good   Clinical Impairments Affecting Rehab Potential heavy lifting at work   PT Frequency 2x / week   PT Duration 4 weeks   PT Treatment/Interventions Manual techniques;Therapeutic exercise;Therapeutic activities;Moist Heat;Electrical Stimulation;Iontophoresis /ml Dexamethasone;Cryotherapy;Patient/family education;Neuromuscular re-education   PT Next Visit Plan lumbar mobilization, thoracic mobilization, lumbopelvic control, ergonomics   Consulted and Agree with Plan of Care Patient        Problem List Patient Active Problem List   Diagnosis Date Noted  . Insomnia 01/19/2013  . Low back pain 01/19/2013  . Hypertension     Tesean Stump 03/26/2015, 7:02 PM  [facility] [address], [place], [address] Phone: [phone]   Fax:  [phone]

## 2015-03-26 NOTE — Patient Instructions (Signed)
Improved exercise technique, movement at target joints, use of target muscles after mod verbal, visual, tactile cues.   Ergonomic lifting, transfers, rolling a patient in bed.

## 2015-03-27 ENCOUNTER — Ambulatory Visit: Payer: Commercial Managed Care - PPO | Admitting: Physical Therapy

## 2015-03-28 ENCOUNTER — Ambulatory Visit: Payer: Commercial Managed Care - PPO

## 2015-03-28 DIAGNOSIS — M5441 Lumbago with sciatica, right side: Secondary | ICD-10-CM

## 2015-03-28 DIAGNOSIS — R531 Weakness: Secondary | ICD-10-CM

## 2015-03-28 DIAGNOSIS — M545 Low back pain: Secondary | ICD-10-CM

## 2015-03-28 NOTE — Patient Instructions (Addendum)
Knee Roll   Lying on back, with knees bent and feet flat on floor, arms outstretched to sides, slowly roll both knees to the right side, hold 5 seconds. Back to starting position, hold 5 seconds.   Keep shoulders and arms in contact with floor.  Perform 3 sets of 10 repetitions.  Copyright  VHI. All rights reserved.   Bridge   Lying on back, legs bent 90, toes pointing up (not shown), squeeze rear end, press palms on the bed/floor.  Lift hips and torso.  Perform 10 times, do 3 sets daily.   Copyright  VHI. All rights reserved.    Improved exercise technique, movement at target joints, use of target muscles after mod verbal, visual, tactile cues.

## 2015-03-28 NOTE — Therapy (Signed)
[facility] [address] [place], [address] Phone: [phone]   Fax:  [phone]  Physical Therapy Treatment  Patient Details  Name: Donna Fitzgerald MRN: [id number] Date of Birth: 1965/09/17 Referring Provider:  Gwenlyn Perking Mar*  Encounter Date: 03/28/2015      PT End of Session - 03/28/15 1123    Visit Number 3   Number of Visits 9   Date for PT Re-Evaluation 04/19/15   PT Start Time 1123   PT Stop Time 1208   PT Time Calculation (min) 45 min   Activity Tolerance Patient tolerated treatment well   Behavior During Therapy [facility] for tasks assessed/performed      Past Medical History  Diagnosis Date  . Arthritis   . Hypertension     Past Surgical History  Procedure Laterality Date  . Hand surgery Right 1995    There were no vitals filed for this visit.  Visit Diagnosis:  Bilateral low back pain, with sciatica presence unspecified  Weakness  Bilateral low back pain with right-sided sciatica      Subjective Assessment - 03/28/15 1124    Subjective Pt states back feeling pretty good today. No pain or discomfort currently. 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 Yesterday was not too bad. Also has been using a pain patch in her back at night. 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    Currently in Pain? No/denies   Multiple Pain Sites No          [facility] Adult PT Treatment/Exercise - 03/28/15 1215    Exercises   Other Exercises  Directed patient with supine bridge 10x3, supine R lower trunk rotation 10x 5 seconds for 3 sets. Reviewed aforementioned exercises and gave them as part of her HEP. Pt demonstrated and verbalized understanding.    Manual Therapy   Manual therapy comments Prone P to A to R sacral ala, R L5, L4  transverse processes; and L sacral ala, L transverse processes of L5, L4, and L3 all grades 3 to 3+; central P to A to mid thoracic spine grade 3           PT Education - 03/28/15 1219    Education provided Yes   Education Details ther-ex, HEP   Person(s) Educated Patient   Methods Explanation;Demonstration;Tactile cues;Verbal cues;Handout   Comprehension Verbalized understanding;Returned demonstration             PT Long Term Goals - 03/21/15 1923    PT LONG TERM GOAL #1   Title Patient will be independent with her HEP to improve ability to walk for longer periods, and improve ability to perform work responsibilities and house chores.    Time 4   Period Weeks   Status New   PT LONG TERM GOAL #2   Title Patient will have a decrease in low back and R LE pain to 4/10 or less at worst to improve ability to perform work tasks and ability to ambulate for longer periods.    Time 4   Period Weeks   Status New   PT LONG TERM GOAL #3   Title Patient will improve her Modified Oswestry Low back Pain Disability questionnaire by at least 10% as a demonstration of improved function.    Time 4  Period Weeks   Status New   PT LONG TERM GOAL #4   Title Patient will improve bilateral hip strength by at least 1/2 MMT grade to help decrease back and R LE pain and improve function.    Time 4   Period Weeks   Status New            Plan - 03/28/15 1213    Clinical Impression Statement Slight R low back discomfort initially with supine R lower trunk rotation which decreased with increased repetition. Demonstrates stiffness with L low back facet joints. Improved lumbar mobility after manual therapy.    Pt will benefit from skilled therapeutic intervention in order to improve on the following deficits Decreased range of motion;Difficulty walking;Pain;Improper body mechanics;Decreased strength   Rehab Potential Good   Clinical Impairments Affecting Rehab Potential heavy lifting at work    PT Frequency 2x / week   PT Duration 4 weeks   PT Treatment/Interventions Manual techniques;Therapeutic exercise;Therapeutic activities;Moist Heat;Electrical Stimulation;Iontophoresis 4mg /ml Dexamethasone;Cryotherapy;Patient/family education;Neuromuscular re-education   PT Next Visit Plan lumbar mobilization, thoracic mobilization, lumbopelvic control, ergonomics   Consulted and Agree with Plan of Care Patient        Problem List Patient Active Problem List   Diagnosis Date Noted  . Insomnia 01/19/2013  . Low back pain 01/19/2013  . Hypertension     Tamika Nou 03/28/2015, 12:21 PM  [place] [facility] [address] [place], [address] Phone: [phone]   Fax:  [phone]

## 2015-03-29 ENCOUNTER — Encounter: Payer: Commercial Managed Care - PPO | Admitting: Physical Therapy

## 2015-04-05 ENCOUNTER — Ambulatory Visit: Payer: Commercial Managed Care - PPO

## 2015-04-05 DIAGNOSIS — M5441 Lumbago with sciatica, right side: Secondary | ICD-10-CM

## 2015-04-05 DIAGNOSIS — R531 Weakness: Secondary | ICD-10-CM

## 2015-04-05 DIAGNOSIS — M545 Low back pain: Secondary | ICD-10-CM | POA: Diagnosis not present

## 2015-04-05 NOTE — Patient Instructions (Addendum)
On Elbows (Prone)   Rise up on elbows as high as possible, keeping hips on floor. Hold __2__ minutes. Repeat _2___ times per set. Do __1__ sets per session. Do __2-3__ sessions per day.  http://orth.exer.us/93   Copyright  VHI. All rights reserved.    Improved back extension, glute max and scapular muscle activation, exercise technique, movement at target joints after mod verbal, visual, tactile cues.

## 2015-04-05 NOTE — Therapy (Signed)
[facility] [address], [place], [address] Phone: [phone]   Fax:  [phone]  Physical Therapy Treatment  Patient Details  Name: Donna Fitzgerald MRN: [id number] Date of Birth: 05/19/1966 Referring Provider:  Gwenlyn Perking Mar*  Encounter Date: 04/05/2015      PT End of Session - 04/05/15 0803    Visit Number 4   Number of Visits 9   Date for PT Re-Evaluation 04/19/15   PT Start Time 0803   PT Stop Time 0848   PT Time Calculation (min) 45 min   Activity Tolerance Patient tolerated treatment well   Behavior During Therapy [facility] for tasks assessed/performed      Past Medical History  Diagnosis Date  . Arthritis   . Hypertension     Past Surgical History  Procedure Laterality Date  . Hand surgery Right 1995    There were no vitals filed for this visit.  Visit Diagnosis:  Bilateral low back pain, with sciatica presence unspecified  Weakness  Bilateral low back pain with right-sided sciatica      Subjective Assessment - 04/05/15 0804    Subjective Back is hurting a little bit but not as bad. Also wearing a pain patch. 7/10 back pain currently (achy pain). 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    Patient Stated Goals Pt expresses desire to walk about 1 mile every morning, play with her grand children.    Currently in Pain? Yes   Pain Score 7    Pain Location Back   Pain Orientation Lower   Pain Descriptors / Indicators Aching   Pain Type Chronic pain   Pain Onset More than a month ago   Pain Relieving Factors back extension   Multiple Pain Sites No            [facility] Adult PT Treatment/Exercise - 04/05/15 0809    Exercises   Other Exercises  Directed patient with prone on elbows x 2 min for 2 sets (reviewed and given as part of her HEP; pt demonstrated and verbalized understanding), prone press-ups 10x3 with 5 second holds, prone glute  max sets 10x3 with 5 seconds each LE, prone scaption 10x3 with 5 second holds each UE, then prone scaption with opposite glute max set 10x3 with 5 second holds each extremity,            PT Education - 04/05/15 0836    Education provided Yes   Education Details ther-ex, HEP   Person(s) Educated Patient   Methods Demonstration;Tactile cues;Verbal cues;Handout   Comprehension Returned demonstration             PT Long Term Goals - 03/21/15 1923    PT LONG TERM GOAL #1   Title Patient will be independent with her HEP to improve ability to walk for longer periods, and improve ability to perform work responsibilities and house chores.    Time 4   Period Weeks   Status New   PT LONG TERM GOAL #2   Title Patient will have a decrease in low back and R LE pain to 4/10 or less at worst to improve ability to perform work tasks and ability to ambulate for longer periods.    Time 4   Period Weeks   Status New   PT LONG TERM GOAL #3   Title Patient will improve her Modified Oswestry Low back Pain Disability questionnaire by at  least 10% as a demonstration of improved function.    Time 4   Period Weeks   Status New   PT LONG TERM GOAL #4   Title Patient will improve bilateral hip strength by at least 1/2 MMT grade to help decrease back and R LE pain and improve function.    Time 4   Period Weeks   Status New               Plan - 04/05/15 1610    Clinical Impression Statement Decreased back pain to 6/10 after prone on elbows. Decreased back pain to 5/10 at end of session. Patient demonstrates disc related back pain.    Pt will benefit from skilled therapeutic intervention in order to improve on the following deficits Decreased range of motion;Difficulty walking;Pain;Improper body mechanics;Decreased strength   Rehab Potential Good   Clinical Impairments Affecting Rehab Potential heavy lifting at work   PT Frequency 2x / week   PT Duration 4 weeks   PT  Treatment/Interventions Manual techniques;Therapeutic exercise;Therapeutic activities;Moist Heat;Electrical Stimulation;Iontophoresis 4mg /ml Dexamethasone;Cryotherapy;Patient/family education;Neuromuscular re-education   PT Next Visit Plan lumbar mobilization, thoracic mobilization, lumbopelvic control, ergonomics   Consulted and Agree with Plan of Care Patient        Problem List Patient Active Problem List   Diagnosis Date Noted  . Insomnia 01/19/2013  . Low back pain 01/19/2013  . Hypertension    Loralyn Freshwater PT, DPT   04/05/2015, 8:57 AM  [facility] PH[facility] SPORTS MEDICINE [address], [place], [address] Phone: [phone]   Fax:  [phone]

## 2015-04-10 ENCOUNTER — Ambulatory Visit: Payer: Commercial Managed Care - PPO

## 2015-04-10 DIAGNOSIS — M545 Low back pain: Secondary | ICD-10-CM | POA: Diagnosis not present

## 2015-04-10 DIAGNOSIS — R531 Weakness: Secondary | ICD-10-CM

## 2015-04-10 DIAGNOSIS — M5441 Lumbago with sciatica, right side: Secondary | ICD-10-CM

## 2015-04-10 NOTE — Patient Instructions (Signed)
Improved exercise technique, movement at target joints, use of target muscles after mod verbal, visual, tactile cues.   

## 2015-04-10 NOTE — Therapy (Signed)
[facility]TS [facility] [address] [place], [address] Phone: [phone]   Fax:  [phone]  Physical Therapy Treatment  Patient Details  Name: Donna Fitzgerald MRN: [id number] Date of Birth: 23-Aug-1965 Referring Provider:  Gwenlyn Perking Mar*  Encounter Date: 04/10/2015      PT End of Session - 04/10/15 0802    Visit Number 5   Number of Visits 9   Date for PT Re-Evaluation 04/19/15   PT Start Time 0802   PT Stop Time 0843   PT Time Calculation (min) 41 min   Activity Tolerance Patient tolerated treatment well   Behavior During Therapy [facility] for tasks assessed/performed      Past Medical History  Diagnosis Date  . Arthritis   . Hypertension     Past Surgical History  Procedure Laterality Date  . Hand surgery Right 1995    There were no vitals filed for this visit.  Visit Diagnosis:  Bilateral low back pain, with sciatica presence unspecified  Weakness  Bilateral low back pain with right-sided sciatica      Subjective Assessment - 04/10/15 0803    Subjective Back is doing pretty good this morning even though she is still at the long term care side. Also using a pain patch. 6/10 soreness in her low back. No radiating pain in R LE for the past few days.   Patient Stated Goals Pt expresses desire to walk about 1 mile every morning, play with her grand children.    Currently in Pain? Yes   Pain Score 6    Pain Location Back   Pain Orientation Lower   Pain Type Chronic pain   Pain Onset More than a month ago   Aggravating Factors  walking, performing chores such as sweeping, or vacuuming, using the weed eater when mowing her lawn   Pain Relieving Factors back extension   Multiple Pain Sites No           [facility] Adult PT Treatment/Exercise - 04/10/15 0809    Exercises   Other Exercises  Directed patient with prone glute max/quad set 10x5 seconds each LE, then with prone scaption 10x5 seconds  each UE, then with opposite prone scaption and opposite prone glute max set 10x5 seconds each extremity for 3 sets, prone press-ups 10x3 with 5 second holds; sitting on physioball: anterior/posterior pelvic tilts 10x3 each direction, side/side pelvic tilts 10x3 each direction,; standing bilateral scapular retraction red band 10x3 with 5 second holds, T-band side step (red) 32 ft x3 each direction. Pt education on low back discs and effect of lumbar flexion and extension on discs.           PT Education - 04/10/15 0813    Education provided Yes   Education Details ther-ex   [employer]) Educated Patient   Methods Demonstration;Tactile cues;Verbal cues   Comprehension Verbalized understanding;Returned demonstration             PT Long Term Goals - 03/21/15 1923    PT LONG TERM GOAL #1   Title Patient will be independent with her HEP to improve ability to walk for longer periods, and improve ability to perform work responsibilities and house chores.    Time 4   Period Weeks   Status New   PT LONG TERM GOAL #2   Title Patient will have a decrease in low back and R LE pain to 4/10 or less at worst to improve ability to perform work  tasks and ability to ambulate for longer periods.    Time 4   Period Weeks   Status New   PT LONG TERM GOAL #3   Title Patient will improve her Modified Oswestry Low back Pain Disability questionnaire by at least 10% as a demonstration of improved function.    Time 4   Period Weeks   Status New   PT LONG TERM GOAL #4   Title Patient will improve bilateral hip strength by at least 1/2 MMT grade to help decrease back and R LE pain and improve function.    Time 4   Period Weeks   Status New            Plan - 04/10/15 0813    Clinical Impression Statement Decreased back pain to 5/10 after exercises. Patient able to perform back extension with neutral spine today (no R trunk rotation with extension).    Pt will benefit from skilled therapeutic  intervention in order to improve on the following deficits Decreased range of motion;Difficulty walking;Pain;Improper body mechanics;Decreased strength   Rehab Potential Good   Clinical Impairments Affecting Rehab Potential heavy lifting at work   PT Frequency 2x / week   PT Duration 4 weeks   PT Treatment/Interventions Manual techniques;Therapeutic exercise;Therapeutic activities;Moist Heat;Electrical Stimulation;Iontophoresis 4mg /ml Dexamethasone;Cryotherapy;Patient/family education;Neuromuscular re-education   PT Next Visit Plan lumbar extension, lumbar mobilization, thoracic mobilization, lumbopelvic control, ergonomics   Consulted and Agree with Plan of Care Patient      Problem List Patient Active Problem List   Diagnosis Date Noted  . Insomnia 01/19/2013  . Low back pain 01/19/2013  . Hypertension     Loralyn Freshwater PT, DPT   04/10/2015, 8:44 PM  [facility] SPORTS MEDICINE [address], [place], [address] Phone: [phone]   Fax:  [phone]

## 2015-04-12 ENCOUNTER — Ambulatory Visit: Payer: Commercial Managed Care - PPO

## 2015-04-12 DIAGNOSIS — M545 Low back pain: Secondary | ICD-10-CM

## 2015-04-12 DIAGNOSIS — M5441 Lumbago with sciatica, right side: Secondary | ICD-10-CM

## 2015-04-12 DIAGNOSIS — R531 Weakness: Secondary | ICD-10-CM

## 2015-04-12 NOTE — Therapy (Signed)
[place] [facility] [address] [place], [address] Phone: [phone]   Fax:  [phone]  Physical Therapy Treatment  Patient Details  Name: Donna Fitzgerald MRN: [id number] Date of Birth: 1965/08/29 Referring Provider:  Serafina Royals Mar*  Encounter Date: 04/12/2015      PT End of Session - 04/12/15 0803    Visit Number 6   Number of Visits 9   Date for PT Re-Evaluation 04/19/15   PT Start Time 0803   PT Stop Time 0843   PT Time Calculation (min) 40 min   Activity Tolerance Patient tolerated treatment well   Behavior During Therapy [facility] for tasks assessed/performed      Past Medical History  Diagnosis Date  . Arthritis   . Hypertension     Past Surgical History  Procedure Laterality Date  . Hand surgery Right 1995    There were no vitals filed for this visit.  Visit Diagnosis:  Bilateral low back pain, with sciatica presence unspecified  Weakness  Bilateral low back pain with right-sided sciatica      Subjective Assessment - 04/12/15 0810    Subjective Back is doing good. 3/10 currently. Not using a pain patch. Still at the long term care side.Marland Kitchen 4.5/10 back pain at worst for the past 3 days.    Patient Stated Goals Pt expresses desire to walk about 1 mile every morning, play with her grand children.    Currently in Pain? Yes   Pain Score 3    Pain Location Back   Pain Orientation Lower   Multiple Pain Sites No            [facility] PT Assessment - 04/12/15 0821    Observation/Other Assessments   Modified Oswertry 12% (minimal self perceived disability)   Strength   Right Hip Extension 4-/5   Right Hip External Rotation  --  4+/5   Right Hip ABduction 4+/5   Left Hip Extension 4-/5   Left Hip External Rotation  --  4+/5   Left Hip ABduction 4+/5           [facility] Adult PT Treatment/Exercise - 04/12/15 0811    Exercises   Other Exercises  Directed patient with prone glute  max/quad set 10x10 seconds each LE, then with prone scaption 10x10 seconds each UE, then with opposite prone scaption and opposite prone glute max set 10x10 seconds each extremity for 3 sets, prone press ups with gentle posterior to anterior pressure to low back by PT 10x2 with 5 second holds, manually resisted seated hip ER, prone glute extension, S/L hip abduction 1-2x each way for each LE. Reviewed progress with hip strength with pt. Directed patient with standing mini squats 10x3, standing bilateral scapular retraction red band 10x3 with 5 second holds, T-band side step red 32 ft x2           PT Education - 04/12/15 2055    Education provided Yes   Education Details ther-ex, HEP   Person(s) Educated Patient   Methods Demonstration;Tactile cues;Verbal cues;Handout   Comprehension Returned demonstration             PT Long Term Goals - 04/12/15 0814    PT LONG TERM GOAL #1   Title Patient will be independent with her HEP to improve ability to walk for longer periods, and improve ability to perform work responsibilities and house chores.    Time 4   Period Weeks   Status  On-going   PT LONG TERM GOAL #2   Title Patient will have a decrease in low back and R LE pain to 4/10 or less at worst to improve ability to perform work tasks and ability to ambulate for longer periods.    Time 4   Period Weeks   Status Partially Met  No R LE pain.    PT LONG TERM GOAL #3   Title Patient will improve her Modified Oswestry Low back Pain Disability questionnaire by at least 10% as a demonstration of improved function.    Time 4   Period Weeks   Status On-going   PT LONG TERM GOAL #4   Title Patient will improve bilateral hip strength by at least 1/2 MMT grade to help decrease back and R LE pain and improve function.    Time 4   Period Weeks   Status Achieved               Plan - 04/12/15 4707    Clinical Impression Statement Patient making consistent progress to decreasing back  pain during sessions. No complain of R LE symptoms. Back symptoms respond well to extension exercises suggesting disc related pathology.    Pt will benefit from skilled therapeutic intervention in order to improve on the following deficits Decreased range of motion;Difficulty walking;Pain;Improper body mechanics;Decreased strength   Rehab Potential Good   Clinical Impairments Affecting Rehab Potential heavy lifting at work   PT Frequency 2x / week   PT Duration 4 weeks   PT Treatment/Interventions Manual techniques;Therapeutic exercise;Therapeutic activities;Moist Heat;Electrical Stimulation;Iontophoresis 75m/ml Dexamethasone;Cryotherapy;Patient/family education;Neuromuscular re-education   PT Next Visit Plan lumbar extension, lumbar mobilization, thoracic mobilization, lumbopelvic control, ergonomics   Consulted and Agree with Plan of Care Patient        Problem List Patient Active Problem List   Diagnosis Date Noted  . Insomnia 01/19/2013  . Low back pain 01/19/2013  . Hypertension    MJoneen BoersPT, DPT  04/12/2015, 9:00 PM  [facility] [address] [place] [address]Phone: [phone]  Fax:  [phone]

## 2015-04-12 NOTE — Patient Instructions (Addendum)
Scapular Retraction: Bilateral   Facing anchor, pull arms back, bringing shoulder blades together. Hold for 5 seconds. Repeat __10__ times per set. Do _3___ sets per session. Do ___1_ sessions per day.   http://orth.exer.us/177   Copyright  VHI. All rights reserved.   Red band.    Improved exercise technique, movement at target joints, use of target muscles after min to mod verbal, visual, tactile cues.

## 2015-04-17 ENCOUNTER — Ambulatory Visit: Payer: Commercial Managed Care - PPO | Admitting: Physical Therapy

## 2015-04-17 DIAGNOSIS — M545 Low back pain: Secondary | ICD-10-CM

## 2015-04-17 DIAGNOSIS — R531 Weakness: Secondary | ICD-10-CM

## 2015-04-17 NOTE — Patient Instructions (Addendum)
All exercises provided were adapted from hep2go.com. Patient was provided a written handout with pictures as described. Any additional cues were manually entered in to handout and copied in to this document.   Band Resisted Side Stepping  Place theraband  around the balls of your feet.  Perform mini-squat as pictured.  Keep your toes pointing forward as you step to the side. Make sure to pick up the feet so they do not drag on the floor. Try to maintain the mini-squat.      ** Use yellow band until it gets easy 1-2/10 intensity, then switch to red band.   SIT TO STAND - NO HANDS  Start by sitting in a chair. Next, raise up to standing without using your hands for support.    ** You can hold on to a weight while doing this. Make sure to keep your back upright.   Supine bridge  Lie down on your back and bend your knees so that your feet are flat on the table. Press your heels into the ground to lift your hips off of the table. You should stabilize through your core. Return to the starting position controlling your speed.

## 2015-04-17 NOTE — Therapy (Signed)
[facility] PHYSICAL AND SPORTS MEDICINE [address], [place], [address] Phone: [phone]   Fax:  [phone]  Physical Therapy Treatment  Patient Details  Name: Donna Fitzgerald MRN: [id number] Date of Birth: March 06, 1966 Referring Provider:  Gwenlyn Perking Mar*  Encounter Date: 04/17/2015      PT End of Session - 04/17/15 0916    Visit Number 7   Number of Visits 9   Date for PT Re-Evaluation 04/19/15   PT Start Time 0828   PT Stop Time 0910   PT Time Calculation (min) 42 min   Activity Tolerance Patient tolerated treatment well   Behavior During Therapy [facility] for tasks assessed/performed      Past Medical History  Diagnosis Date  . Arthritis   . Hypertension     Past Surgical History  Procedure Laterality Date  . Hand surgery Right 1995    There were no vitals filed for this visit.  Visit Diagnosis:  Bilateral low back pain, with sciatica presence unspecified  Weakness      Subjective Assessment - 04/17/15 0829    Subjective Patient reports no back pain this morning, some back pain Saturday while working outside.    Pertinent History Back pain began 2002. Pt picked up someone while at work and turned the wrong way. Felt a pinched nerve in her R side. The back of her R thigh, and leg feels tingly and numb. Walking about half a mile increases R LE paresthesia. Had PT in 2002 for her back and R LE which helped a little bit. Performed exercises such as LAQ and clamshells. Pt stats that the aforementioned exercises did not really help. Had moist heat and electrical stimulation on her back during prior PT which helped. Does a lot of lifting at work. 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   Denies bowel or bladder problems or saddle anesthesia.  Feels like she might fall at times because her R leg feels numb.    Patient Stated Goals Pt expresses desire to  walk about 1 mile every morning, play with her grand children.    Currently in Pain? No/denies       TherEx   Reviewed HEP including swimmer's, prone-press ups, mid rows with a band   Reviewed lifting mechanics with a ball, then 8# dumb-bell. Had patient sitting back to a chair with cuing for upright trunk posture, initially displayed anterior tibial displacement and on her toes posturing. Cued to sit onto her heels with more upright trunk posture.   Reviewed sit to stand and supine to sit mechanics with cuing for wider stance and upright trunk positioning with min to mod transfers x 5 each with no increase in symptoms.   Side stepping with yellow t-band x 10 per side and with red t-band x 10 per side with cuing for maintaining knee flexion and upright trunk posture. Activated thigh musculature appropriately.   Trained patient to shift her weight forward and back with body blade to mimic weed-wacking which had increased her symptoms. Patient able to distinguish between flexing her spine and shifting her weight forward.   Supine bridging x 10 for 2 sets with appropriate gluteal activation and no increase in back pain symptoms.                         PT Education - 04/17/15 6962  Education provided Yes   Education Details Architectural technologist, HEP, sit to stand and bed mobility assistance techniques.    Person(s) Educated Patient   Methods Explanation;Demonstration;Handout   Comprehension Verbalized understanding;Returned demonstration             PT Long Term Goals - 04/17/15 0919    PT LONG TERM GOAL #1   Title Patient will be independent with her HEP to improve ability to walk for longer periods, and improve ability to perform work responsibilities and house chores.    Baseline Provided HEP on 04/17/2015.   Time 4   Period Weeks   Status On-going   PT LONG TERM GOAL #2   Title Patient will have a decrease in low back and R LE pain to 4/10 or less at worst  to improve ability to perform work tasks and ability to ambulate for longer periods.    Baseline Patient states no back pain in 3 days, encouraged to walk tomorrow after work and report symptoms to PT.    Time 4   Period Weeks   Status On-going   PT LONG TERM GOAL #3   Title Patient will improve her Modified Oswestry Low back Pain Disability questionnaire by at least 10% as a demonstration of improved function.    Baseline Achieved (8% on 04/17/2015).   Time 4   Period Weeks   Status Achieved   PT LONG TERM GOAL #4   Title Patient will improve bilateral hip strength by at least 1/2 MMT grade to help decrease back and R LE pain and improve function.    Time 4   Period Weeks   Status On-going               Plan - 04/17/15 0916    Clinical Impression Statement Patient is able to display improved lifting mechanics and perform job related activities such as sit to stand and supine to sit transfers with no increase in symptoms. Patient encouraged to bein a walking program and provided with a HEP designed to strengthen her LEs, and promote improved movement patterns (glute activation over lumbar extenstion).    Pt will benefit from skilled therapeutic intervention in order to improve on the following deficits Decreased range of motion;Difficulty walking;Pain;Improper body mechanics;Decreased strength   Rehab Potential Good   Clinical Impairments Affecting Rehab Potential heavy lifting at work   PT Frequency 2x / week   PT Duration 4 weeks   PT Treatment/Interventions Manual techniques;Therapeutic exercise;Therapeutic activities;Moist Heat;Electrical Stimulation;Iontophoresis 4mg /ml Dexamethasone;Cryotherapy;Patient/family education;Neuromuscular re-education   PT Next Visit Plan Set up home exercise program, manual therapy.    PT Home Exercise Plan See patient instructions.   Consulted and Agree with Plan of Care Patient        Problem List Patient Active Problem List   Diagnosis  Date Noted  . Insomnia 01/19/2013  . Low back pain 01/19/2013  . Hypertension     Kerin Ransom, PT, DPT    04/17/2015, 9:50 AM  [facility] PHYSICAL AND SPORTS MEDICINE [address], [place], [address] Phone: [phone]   Fax:  [phone]

## 2015-04-19 ENCOUNTER — Ambulatory Visit: Payer: Commercial Managed Care - PPO | Attending: Family Medicine | Admitting: Physical Therapy

## 2015-04-19 DIAGNOSIS — M5441 Lumbago with sciatica, right side: Secondary | ICD-10-CM | POA: Diagnosis present

## 2015-04-19 DIAGNOSIS — M545 Low back pain: Secondary | ICD-10-CM | POA: Insufficient documentation

## 2015-04-19 DIAGNOSIS — R531 Weakness: Secondary | ICD-10-CM | POA: Diagnosis present

## 2015-04-19 NOTE — Therapy (Addendum)
[facility] PHYSICAL AND SPORTS MEDICINE [address], [place], [address] Phone: [phone]   Fax:  [phone]  Physical Therapy Treatment/DISCHARGE  Patient Details  Name: Donna Fitzgerald MRN: [id number] Date of Birth: 12-24-1965 Referring Provider:  Serafina Royals Mar*  Encounter Date: 04/19/2015      PT End of Session - 04/19/15 0916    Visit Number 8   Number of Visits 9   Date for PT Re-Evaluation 04/19/15   PT Start Time 0825   PT Stop Time 0912   PT Time Calculation (min) 47 min   Activity Tolerance Patient tolerated treatment well   Behavior During Therapy [facility] for tasks assessed/performed      Past Medical History  Diagnosis Date  . Arthritis   . Hypertension     Past Surgical History  Procedure Laterality Date  . Hand surgery Right 1995    There were no vitals filed for this visit.  Visit Diagnosis:  Bilateral low back pain, with sciatica presence unspecified  Weakness  Bilateral low back pain with right-sided sciatica      Subjective Assessment - 04/19/15 0828    Subjective Patient reports that she was able to walk on a treadmill this morning and perform some light exercises. She brings in pictures of her exercise equipment at [facility], and would like to begin an exercise and walking program.    Pertinent History Back pain began 2002. Pt picked up someone while at work and turned the wrong way. Felt a pinched nerve in her R side. The back of her R thigh, and leg feels tingly and numb. Walking about half a mile increases R LE paresthesia. Had PT in 2002 for her back and R LE which helped a little bit. Performed exercises such as LAQ and clamshells. Pt stats that the aforementioned exercises did not really help. Had moist heat and electrical stimulation on her back during prior PT which helped. Does a lot of lifting at work. 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   Denies bowel or bladder problems or saddle anesthesia.  Feels like she might fall at times because her R leg feels numb.    Limitations Walking   Patient Stated Goals Pt expresses desire to walk about 1 mile every morning, play with her grand children.    Currently in Pain? No/denies      Leg extensions x 5# for 12 reps Hamstring curls x 5# for 12 reps Lat Pulldowns x 15# for 12 reps  Seated rows x 15# for 12 reps  Leg Press x 55# for 12 reps   Nustep level 2 x 2 minutes   Goblet squats with chair behind and 8# DB x 10 with cuing for hip hinging and posterior weight shifting.   Single leg deadlifts with HHA from rail x 10 with appropriate activation   Lateral band walks with green band x 10 for 2 sets bilaterally   Lifting 20# KB off bed, cuing for neutral spine (big chest) and pushing her hips back x 8, no pain   Supine chest press x 5# DBs x 10                           PT Education - 04/19/15 0916    Education provided Yes   Education Details Lifting mechanics and maintenance weightlifting routines.  Person(s) Educated Patient   Methods Explanation;Demonstration;Verbal cues;Handout   Comprehension Returned demonstration;Verbalized understanding             PT Long Term Goals - 04/19/15 0842    PT LONG TERM GOAL #1   Title Patient will be independent with her HEP to improve ability to walk for longer periods, and improve ability to perform work responsibilities and house chores.    Baseline Provided HEP on 04/17/2015.   Time 4   Period Weeks   Status Achieved   PT LONG TERM GOAL #2   Title Patient will have a decrease in low back and R LE pain to 4/10 or less at worst to improve ability to perform work tasks and ability to ambulate for longer periods.    Baseline Patient states no back pain in 3 days, encouraged to walk tomorrow after work and report symptoms to PT.    Time 4   Period Weeks   Status Achieved    PT LONG TERM GOAL #3   Title Patient will improve her Modified Oswestry Low back Pain Disability questionnaire by at least 10% as a demonstration of improved function.    Baseline Achieved (8% on 04/17/2015).   Time 4   Period Weeks   Status Achieved   PT LONG TERM GOAL #4   Title Patient will improve bilateral hip strength by at least 1/2 MMT grade to help decrease back and R LE pain and improve function.    Time 4   Period Weeks   Status Achieved               Plan - 04/19/15 9476    Clinical Impression Statement Patient has met all goals with therapy and reports no back pain for some time now. She demonstrates proper lifting mechanics with no increase in pain, and is able to complete a maintenance program during this session with no increase in symptoms. Patient will be discharged at this time with all goals met.    Pt will benefit from skilled therapeutic intervention in order to improve on the following deficits Decreased range of motion;Difficulty walking;Pain;Improper body mechanics;Decreased strength   Rehab Potential Good   Clinical Impairments Affecting Rehab Potential heavy lifting at work   PT Frequency 2x / week   PT Duration 4 weeks   PT Treatment/Interventions Manual techniques;Therapeutic exercise;Therapeutic activities;Moist Heat;Electrical Stimulation;Iontophoresis 68m/ml Dexamethasone;Cryotherapy;Patient/family education;Neuromuscular re-education   PT Next Visit Plan Discharged    PT Home Exercise Plan See patient instructions.   Consulted and Agree with Plan of Care Patient        Problem List Patient Active Problem List   Diagnosis Date Noted  . Insomnia 01/19/2013  . Low back pain 01/19/2013  . Hypertension    PKerman Passey PT, DPT    04/19/2015, 9:22 AM  [facility] [address] [place] [id number]: [phone]  Fax:  [phone]

## 2015-04-19 NOTE — Patient Instructions (Addendum)
All exercises provided were adapted from [email or web address].com. Patient was provided a written handout with pictures as described. Any additional cues were manually entered in to handout and copied in to this document.  Leg Press  Preparation  Sit on machine with back on padded support. Place feet on platform. Grasp handles to sides. Execution  Push platform away by extending knees and hips. Return and repeat.  Knee Extension  Knee Extension machine needed for this exercise.  Set all adjustments, including weight.  Start with the knees in a flexed position.  Push up with equal force with both legs until knee is in full extension.  Come back down in a slow controlled movement.  Repeat.   Seated Hamstring Curl  Make all necessary adjustments to the machine before starting exercise. Start with your legs straight out in front of you, the backs of your ankles on the padding. Using your hamstrings, pull the pad down towards the floor and return to start in one fluid motion.   Goblet Squat   Hold weight in both hands as pictured. Pull elbows in close together and perform squat. Elbows should end up between knees at the bottom of the squat.    Band Sidesteps  Standing in a quarter squat position with feet shoulder width apart and miniband around ankles Moving to the left push with the right leg while stepping laterally with the left Bring the right foot back to the starting position and continue for the prescribed number of repetitions Repeat while moving to the left Keep chest up and back flat. Keep knees pushed apart and toes pointed straight forward. Keep tension on the miniband at all times. Do not let feet come together  Pallof Punch  Stand with your right side facing the weight stack with your feet about shoulder-width apart, knees slightly bent. Step away from the stack so the cable is taut, hold the handle against your chest and brace your abs. Slowly press your arms in front of you until they're  completely straight, pause for a second, and bring them back.  Prevent trunk rotation.    FREE WEIGHT - CHEST PRESS  Lie on your back with your elbows bent. Next, slowly raise up your arms towards the ceiling while extending your elbows straight up above your head.    SEATED LAT PULLDOWN  Sit Facing out. Tighten your abdominals by pressing low back into seat back. Hold this throughout exercise. Pull Lat Bar down to your chest bone, trying to pull symmetrically. Slowly return to starting position.  Be careful not to arch back or look up. It is best to maintain posture more then performing with higher weight,    Seated Row  Sit facing in. Tighten your abdominals throughout exercise. Use lower handle and pull both handles evenly back squeezing your shoulder blades.   Be careful to not lean on padding in front of you.

## 2015-10-06 IMAGING — DX DG CHEST 2V
2 series · 2 of 2 positions shown · non-contrast
Comparison: None.

CLINICAL DATA: Chronic cough.

EXAM:
CHEST  2 VIEW

[chest pa]
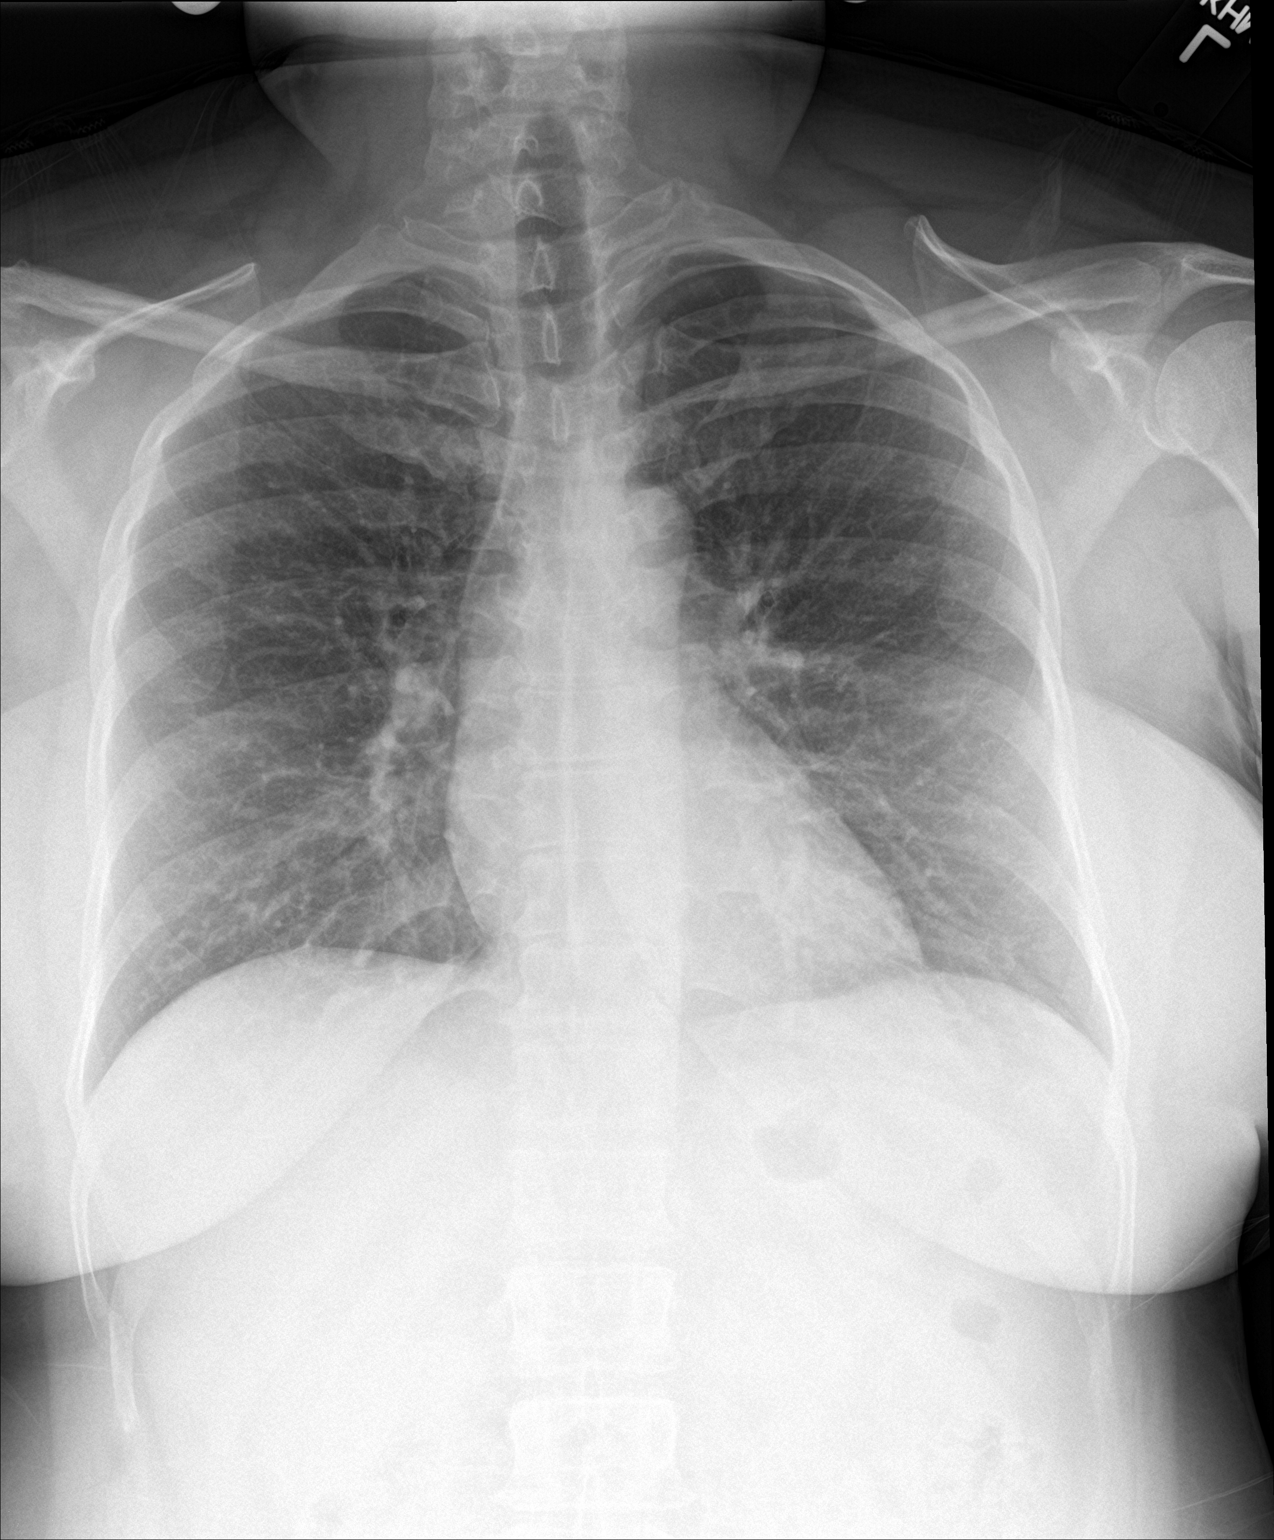

[chest lat]
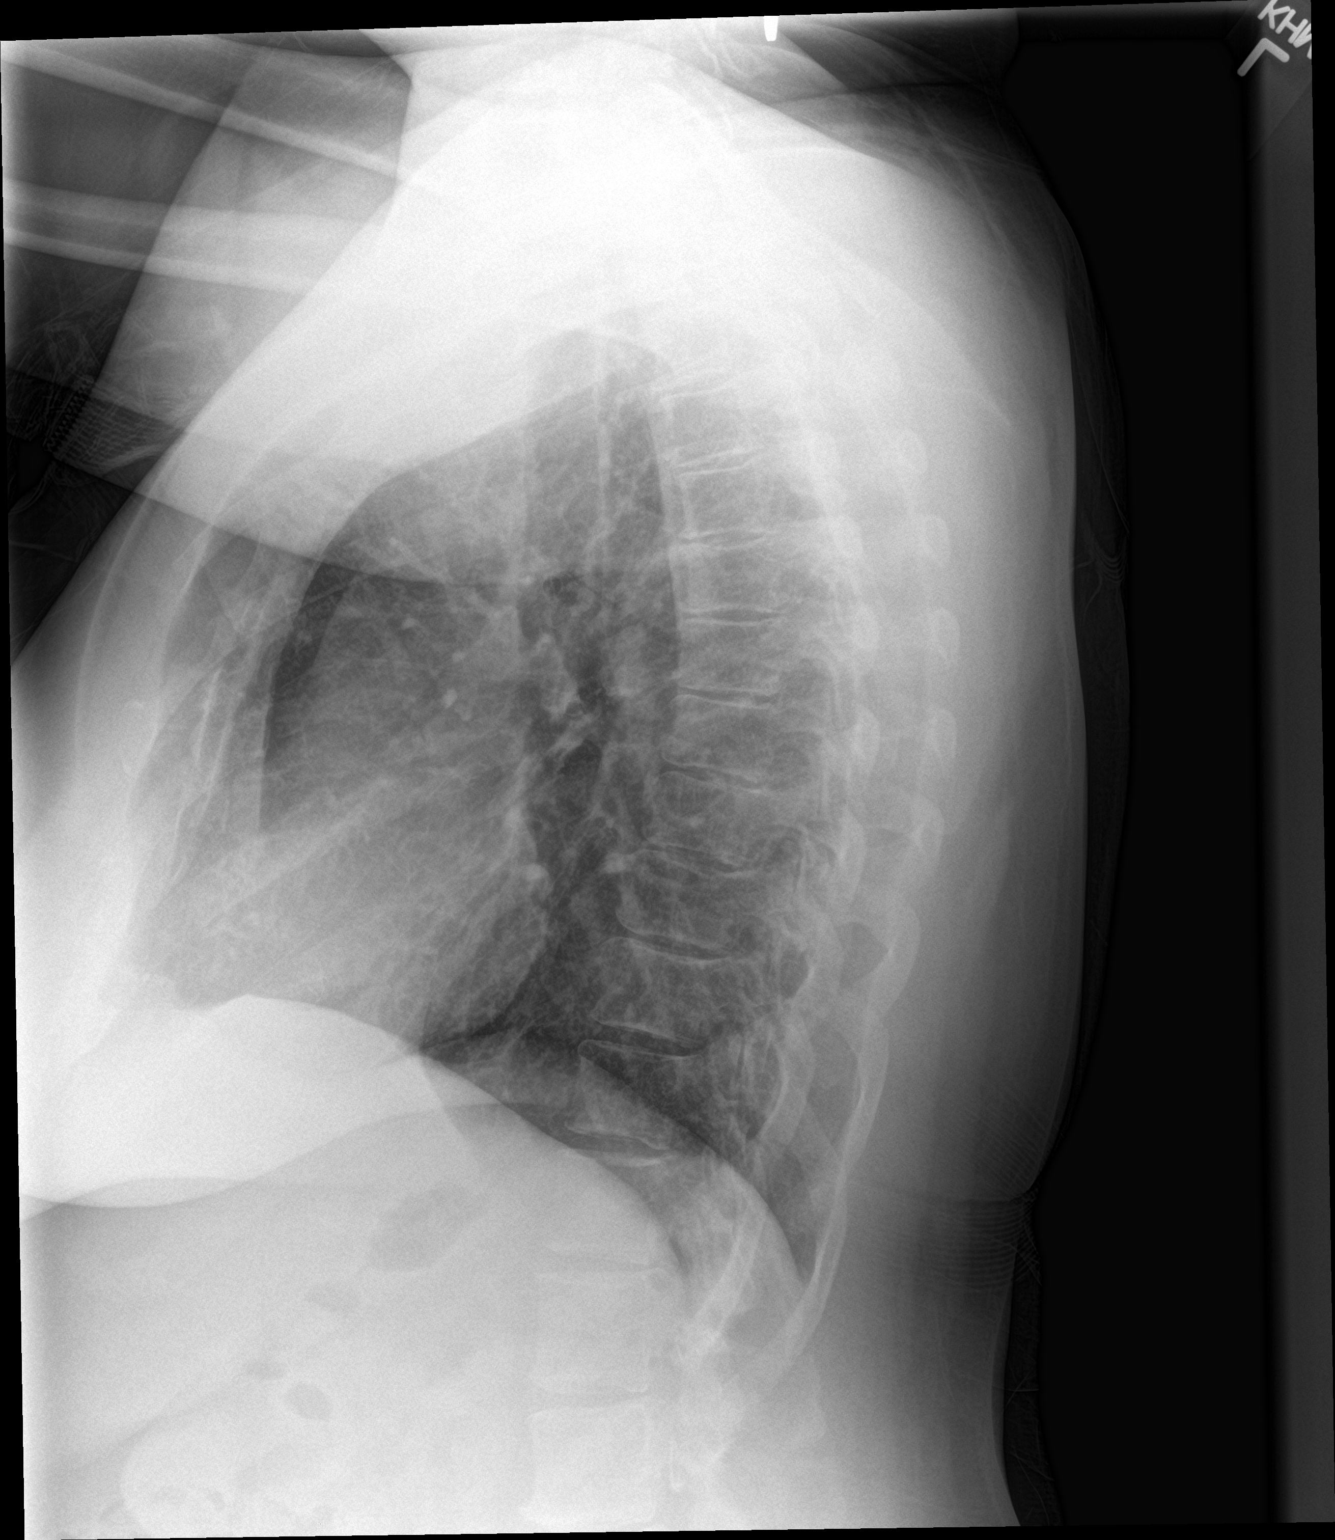

[2 of 2 positions shown; findings below may reference images not displayed]

FINDINGS: Mediastinum and hilar structures are normal. Lungs are clear. Heart
size normal. No pleural effusion or pneumothorax. No acute bony
abnormality.
IMPRESSION: No acute cardiopulmonary disease.

## 2016-03-13 ENCOUNTER — Ambulatory Visit (INDEPENDENT_AMBULATORY_CARE_PROVIDER_SITE_OTHER): Payer: Commercial Managed Care - PPO | Admitting: Orthopaedic Surgery

## 2016-03-13 ENCOUNTER — Ambulatory Visit (HOSPITAL_COMMUNITY)
Admission: RE | Admit: 2016-03-13 | Discharge: 2016-03-13 | Disposition: A | Discharge status: Home / Self Care | Payer: Commercial Managed Care - PPO | Source: Ambulatory Visit | Attending: Orthopaedic Surgery | Admitting: Orthopaedic Surgery

## 2016-03-13 ENCOUNTER — Encounter: Payer: Self-pay | Admitting: Orthopaedic Surgery

## 2016-03-13 VITALS — BP 118/79 | HR 98 | Ht 62.0 in | Wt 181.6 lb

## 2016-03-13 DIAGNOSIS — I700 Atherosclerosis of aorta: Secondary | ICD-10-CM | POA: Insufficient documentation

## 2016-03-13 DIAGNOSIS — M5442 Lumbago with sciatica, left side: Secondary | ICD-10-CM | POA: Insufficient documentation

## 2016-03-13 DIAGNOSIS — M5136 Other intervertebral disc degeneration, lumbar region: Secondary | ICD-10-CM | POA: Insufficient documentation

## 2016-03-13 DIAGNOSIS — F17200 Nicotine dependence, unspecified, uncomplicated: Secondary | ICD-10-CM

## 2016-03-13 DIAGNOSIS — Z720 Tobacco use: Secondary | ICD-10-CM | POA: Diagnosis not present

## 2016-03-13 MED ORDER — NAPROXEN 500 MG PO TABS: 500.0000 mg | ORAL_TABLET | Freq: Two times a day (BID) | ORAL | 5 refills | Status: DC

## 2016-03-13 NOTE — Progress Notes (Signed)
Subjective:  My back hurts    Patient ID: Donna Fitzgerald, female    DOB: Mar 29, 1966, 50 y.o.   MRN: [id number]  HPI She has long history of lower back pain.  She has been followed by the [facility].  She has had MRI in 2014 and x-rays of the lumbar spine last year.  She is getting gradually worse.  She has pain of the lower back radiating to the left lateral foot. It is sharp pain at times.  She has tried ibuprofen 800 and that did not help.  She has been on muscle relaxant which helps slightly.  She has used heat, ice, rest and rubs with no help.  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  She has no trauma.  She smokes and I have talked to her about cutting back or stopping.  She is not ready to quit yet.  She has hypertension and is on medicine for this and it is controlled.   Review of Systems  HENT: Negative for congestion.   Respiratory: Negative for cough and shortness of breath.   Cardiovascular: Negative for chest pain and leg swelling.  Endocrine: Positive for cold intolerance.  Musculoskeletal: Positive for arthralgias and back pain.  Allergic/Immunologic: Positive for environmental allergies.   Past Medical History:  Diagnosis Date  . Arthritis   . Hypertension     Past Surgical History:  Procedure Laterality Date  . HAND SURGERY Right 1995    Current Outpatient Prescriptions on File Prior to Visit  Medication Sig Dispense Refill  . amitriptyline (ELAVIL) 100 MG tablet TAKE 1 TABLET (100 MG TOTAL) BY MOUTH AT BEDTIME. 30 tablet 0  . amLODipine (NORVASC) 10 MG tablet Take 1 tablet (10 mg total) by mouth daily. 90 tablet 3  . metoprolol (LOPRESSOR) 50 MG tablet TAKE 1 TABLET BY MOUTH TWICE A DAY 60 tablet 0  . acetaminophen (TYLENOL) 650 MG CR tablet Take 650 mg by mouth every 8 (eight) hours as needed for pain. Takes two    . cyclobenzaprine (FLEXERIL) 10 MG tablet TAKE 1 TABLET (10 MG TOTAL) BY MOUTH 3 (THREE) TIMES DAILY AS NEEDED FOR MUSCLE SPASMS.  (Patient not taking: Reported on 03/13/2016) 60 tablet 2  . enalapril (VASOTEC) 20 MG tablet TAKE 1 TABLET BY MOUTH EVERY DAY (Patient not taking: Reported on 03/13/2016) 90 tablet 0  . meloxicam (MOBIC) 7.5 MG tablet TAKE 1 TABLET BY MOUTH TWICE A DAY WITH FOOD (Patient not taking: Reported on 03/13/2016) 60 tablet 0   No current facility-administered medications on file prior to visit.     Social History   Social History  . Marital status: Divorced    Spouse name: N/A  . Number of children: N/A  . Years of education: N/A   Occupational History  . Not on file.   Social History Main Topics  . Smoking status: Current Every Day Smoker    Packs/day: 3.00    Years: 30.00    Types: Cigarettes  . Smokeless tobacco: Never Used  . Alcohol use Yes     Comment: 2 beverages per week  . Drug use: No  . Sexual activity: Not on file   Other Topics Concern  . Not on file   Social History Narrative  . No narrative on file    Family History  Problem Relation Age of Onset  . Hypertension Mother   . Heart disease Father   . Hypertension Father   .  Heart disease Sister   . Hypertension Sister     BP 118/79   Pulse 98   Ht 5\' 2"  (1.575 m)   Wt 181 lb 9.6 oz (82.4 kg)   LMP 05/04/2013   BMI 33.22 kg/m      Objective:   Physical Exam  Constitutional: She is oriented to person, place, and time. She appears well-developed and well-nourished.  HENT:  Head: Normocephalic and atraumatic.  Eyes: Conjunctivae and EOM are normal. Pupils are equal, round, and reactive to light.  Neck: Normal range of motion. Neck supple.  Cardiovascular: Normal rate, regular rhythm and intact distal pulses.   Pulmonary/Chest: Effort normal.  Abdominal: Soft.  Musculoskeletal: She exhibits tenderness (Pain lower back with SLR + left at 30 degrees, no spasm, lacks touching toes by 6 inches, lateral bend and extension normal.  NV intact.  Gait normal.).  Neurological: She is alert and oriented to person,  place, and time. She displays normal reflexes. No cranial nerve deficit. She exhibits normal muscle tone. Coordination normal.  Skin: Skin is warm and dry.  Psychiatric: She has a normal mood and affect. Her behavior is normal. Judgment and thought content normal.          Assessment & Plan:   Encounter Diagnoses  Name Primary?  . Left-sided low back pain with left-sided sciatica Yes  . Tobacco smoker within last 12 months    She will need new lumbar x-rays and then a MRI of the lower back. She has not improved with conservative treatment and I am concerned about HNP on the left lower back.  I have added Naprosyn.  Precautions discussed.  Return after the MRI.  Call if any problem.  Electronically Signed Darreld Mclean, MD 7/27/20179:06 AM

## 2016-03-13 NOTE — Patient Instructions (Signed)

## 2016-03-31 ENCOUNTER — Ambulatory Visit (HOSPITAL_COMMUNITY): Payer: Commercial Managed Care - PPO

## 2016-04-02 ENCOUNTER — Ambulatory Visit: Payer: Commercial Managed Care - PPO | Admitting: Orthopaedic Surgery

## 2016-04-03 ENCOUNTER — Ambulatory Visit (HOSPITAL_COMMUNITY)
Admission: RE | Admit: 2016-04-03 | Discharge: 2016-04-03 | Disposition: A | Discharge status: Home / Self Care | Payer: Commercial Managed Care - PPO | Source: Ambulatory Visit | Attending: Orthopaedic Surgery | Admitting: Orthopaedic Surgery

## 2016-04-03 DIAGNOSIS — M5442 Lumbago with sciatica, left side: Secondary | ICD-10-CM | POA: Diagnosis present

## 2016-04-03 DIAGNOSIS — M5136 Other intervertebral disc degeneration, lumbar region: Secondary | ICD-10-CM | POA: Insufficient documentation

## 2016-04-10 ENCOUNTER — Ambulatory Visit (INDEPENDENT_AMBULATORY_CARE_PROVIDER_SITE_OTHER): Payer: Commercial Managed Care - PPO | Admitting: Orthopaedic Surgery

## 2016-04-10 ENCOUNTER — Encounter: Payer: Self-pay | Admitting: Orthopaedic Surgery

## 2016-04-10 VITALS — BP 123/73 | HR 111 | Temp 97.5°F | Ht 62.0 in | Wt 184.0 lb

## 2016-04-10 DIAGNOSIS — Z720 Tobacco use: Secondary | ICD-10-CM | POA: Diagnosis not present

## 2016-04-10 DIAGNOSIS — M5442 Lumbago with sciatica, left side: Secondary | ICD-10-CM

## 2016-04-10 DIAGNOSIS — F17200 Nicotine dependence, unspecified, uncomplicated: Secondary | ICD-10-CM

## 2016-04-10 NOTE — Progress Notes (Signed)
Patient ZO:XWRUE:Donna Delight OvensS Maddison, female DOB:[birth year], 50 y.o. AVW:[id number]  Chief Complaint  Patient presents with  . Follow-up    review results MRI L spine    HPI  Donna Fitzgerald is a 50 y.o. female who has continued pain of the lower back.  She had MRI done and it shows:  IMPRESSION: 1. Lumbar spondylosis and degenerative disc disease, increased from prior, causing prominent impingement at L3-4 and L4-5 ; and moderate impingement at L2- 3, as detailed above. 2. Degenerative facet edema on the left at L3-4 and L4-5.  I have explained the findings to her.  I have recommended she see a neurosurgeon.  She is agreeable to this.  HPI  Body mass index is 33.65 kg/m.  ROS  Review of Systems  HENT: Negative for congestion.   Respiratory: Negative for cough and shortness of breath.   Cardiovascular: Negative for chest pain and leg swelling.  Endocrine: Positive for cold intolerance.  Musculoskeletal: Positive for arthralgias and back pain.  Allergic/Immunologic: Positive for environmental allergies.    Past Medical History:  Diagnosis Date  . Arthritis   . Hypertension     Past Surgical History:  Procedure Laterality Date  . HAND SURGERY Right 1995    Family History  Problem Relation Age of Onset  . Hypertension Mother   . Heart disease Father   . Hypertension Father   . Heart disease Sister   . Hypertension Sister     Social History Social History  Substance Use Topics  . Smoking status: Current Every Day Smoker    Packs/day: 3.00    Years: 30.00    Types: Cigarettes  . Smokeless tobacco: Never Used  . Alcohol use Yes     Comment: 2 beverages per week    No Known Allergies  Current Outpatient Prescriptions  Medication Sig Dispense Refill  . acetaminophen (TYLENOL) 650 MG CR tablet Take 650 mg by mouth every 8 (eight) hours as needed for pain. Takes two    . amitriptyline (ELAVIL) 100 MG tablet TAKE 1 TABLET (100 MG TOTAL) BY MOUTH AT BEDTIME. 30  tablet 0  . amLODipine (NORVASC) 10 MG tablet Take 1 tablet (10 mg total) by mouth daily. 90 tablet 3  . aspirin 81 MG tablet Take 81 mg by mouth daily.    . cyclobenzaprine (FLEXERIL) 10 MG tablet TAKE 1 TABLET (10 MG TOTAL) BY MOUTH 3 (THREE) TIMES DAILY AS NEEDED FOR MUSCLE SPASMS. 60 tablet 2  . enalapril (VASOTEC) 20 MG tablet TAKE 1 TABLET BY MOUTH EVERY DAY 90 tablet 0  . meloxicam (MOBIC) 7.5 MG tablet TAKE 1 TABLET BY MOUTH TWICE A DAY WITH FOOD 60 tablet 0  . methocarbamol (ROBAXIN) 500 MG tablet Take 500 mg by mouth 2 (two) times daily after a meal.    . metoprolol (LOPRESSOR) 50 MG tablet TAKE 1 TABLET BY MOUTH TWICE A DAY 60 tablet 0  . naproxen (NAPROSYN) 500 MG tablet Take 1 tablet (500 mg total) by mouth 2 (two) times daily with a meal. 60 tablet 5  . pravastatin (PRAVACHOL) 80 MG tablet Take 80 mg by mouth daily.     No current facility-administered medications for this visit.      Physical Exam  Blood pressure 123/73, pulse (!) 111, temperature 97.5 F (36.4 C), height 5\' 2"  (1.575 m), weight 184 lb (83.5 kg), last menstrual period 05/04/2013.  Constitutional: overall normal hygiene, normal nutrition, well developed, normal grooming, normal body habitus. Assistive device:none  Musculoskeletal:  gait and station Limp none, muscle tone and strength are normal, no tremors or atrophy is present.  .  Neurological: coordination overall normal.  Deep tendon reflex/nerve stretch intact.  Sensation normal.  Cranial nerves II-XII intact.   Skin:   normal overall no scars, lesions, ulcers or rashes. No psoriasis.  Psychiatric: Alert and oriented x 3.  Recent memory intact, remote memory unclear.  Normal mood and affect. Well groomed.  Good eye contact.  Cardiovascular: overall no swelling, no varicosities, no edema bilaterally, normal temperatures of the legs and arms, no clubbing, cyanosis and good capillary refill.  Lymphatic: palpation is normal.  Spine/Pelvis  examination:  Inspection:  Overall, sacoiliac joint benign and hips nontender; without crepitus or defects.   Thoracic spine inspection: Alignment normal without kyphosis present   Lumbar spine inspection:  Alignment  with normal lumbar lordosis, without scoliosis apparent.   Thoracic spine palpation:  without tenderness of spinal processes   Lumbar spine palpation: with tenderness of lumbar area; without tightness of lumbar muscles    Range of Motion:   Lumbar flexion, forward flexion is 45 without pain or tenderness    Lumbar extension is 10 without pain or tenderness   Left lateral bend is Normal  without pain or tenderness   Right lateral bend is Normal without pain or tenderness   Straight leg raising is Normal   Strength & tone: Normal   Stability overall normal stability     The patient has been educated about the nature of the problem(s) and counseled on treatment options.  The patient appeared to understand what I have discussed and is in agreement with it.  Encounter Diagnoses  Name Primary?  . Left-sided low back pain with left-sided sciatica Yes  . Tobacco smoker within last 12 months     PLAN Call if any problems.  Precautions discussed.  Continue current medications.   Return to clinic to see neurosurgeon   Electronically Signed Darreld McleanWayne Keshav Winegar, MD 8/24/20179:13 AM

## 2016-04-15 ENCOUNTER — Other Ambulatory Visit: Payer: Self-pay | Admitting: Radiology

## 2016-04-15 ENCOUNTER — Telehealth: Payer: Self-pay | Admitting: Radiology

## 2016-04-15 DIAGNOSIS — M5442 Lumbago with sciatica, left side: Secondary | ICD-10-CM

## 2016-04-15 NOTE — Telephone Encounter (Signed)
[facility] rejected this referral.  The patient was dismissed from their practice in 2004.

## 2016-04-15 NOTE — Telephone Encounter (Signed)
Send to another neurosurgeon 

## 2016-04-16 NOTE — Telephone Encounter (Signed)
[facility]

## 2016-05-12 DIAGNOSIS — M5416 Radiculopathy, lumbar region: Secondary | ICD-10-CM | POA: Insufficient documentation

## 2016-06-13 DIAGNOSIS — T884XXA Failed or difficult intubation, initial encounter: Secondary | ICD-10-CM | POA: Insufficient documentation

## 2016-06-14 DIAGNOSIS — Z9889 Other specified postprocedural states: Secondary | ICD-10-CM | POA: Insufficient documentation

## 2016-12-10 ENCOUNTER — Other Ambulatory Visit: Payer: Self-pay | Admitting: Nurse Practitioner

## 2016-12-10 DIAGNOSIS — Z1231 Encounter for screening mammogram for malignant neoplasm of breast: Secondary | ICD-10-CM

## 2017-01-20 ENCOUNTER — Emergency Department (HOSPITAL_COMMUNITY)
Admission: EM | Admit: 2017-01-20 | Discharge: 2017-01-20 | Disposition: A | Discharge status: Home / Self Care | Payer: Commercial Managed Care - PPO | Attending: Emergency Medicine | Admitting: Emergency Medicine

## 2017-01-20 ENCOUNTER — Emergency Department (HOSPITAL_COMMUNITY): Payer: Commercial Managed Care - PPO

## 2017-01-20 ENCOUNTER — Encounter (HOSPITAL_COMMUNITY): Payer: Self-pay

## 2017-01-20 DIAGNOSIS — Z7982 Long term (current) use of aspirin: Secondary | ICD-10-CM | POA: Insufficient documentation

## 2017-01-20 DIAGNOSIS — F17210 Nicotine dependence, cigarettes, uncomplicated: Secondary | ICD-10-CM | POA: Diagnosis not present

## 2017-01-20 DIAGNOSIS — Y92410 Unspecified street and highway as the place of occurrence of the external cause: Secondary | ICD-10-CM | POA: Insufficient documentation

## 2017-01-20 DIAGNOSIS — I10 Essential (primary) hypertension: Secondary | ICD-10-CM | POA: Diagnosis not present

## 2017-01-20 DIAGNOSIS — Z79899 Other long term (current) drug therapy: Secondary | ICD-10-CM | POA: Insufficient documentation

## 2017-01-20 DIAGNOSIS — Y999 Unspecified external cause status: Secondary | ICD-10-CM | POA: Insufficient documentation

## 2017-01-20 DIAGNOSIS — Y939 Activity, unspecified: Secondary | ICD-10-CM | POA: Insufficient documentation

## 2017-01-20 DIAGNOSIS — S161XXA Strain of muscle, fascia and tendon at neck level, initial encounter: Secondary | ICD-10-CM | POA: Insufficient documentation

## 2017-01-20 DIAGNOSIS — S199XXA Unspecified injury of neck, initial encounter: Secondary | ICD-10-CM | POA: Diagnosis present

## 2017-01-20 MED ORDER — HYDROCODONE-ACETAMINOPHEN 5-325 MG PO TABS
2.0000 | ORAL_TABLET | Freq: Once | ORAL | Status: AC
  Administered 2017-01-20: 2 via ORAL
  Filled 2017-01-20: qty 2

## 2017-01-20 MED ORDER — HYDROCODONE-ACETAMINOPHEN 5-325 MG PO TABS: 1.0000 | ORAL_TABLET | Freq: Four times a day (QID) | ORAL | 0 refills | Status: DC | PRN

## 2017-01-20 NOTE — Discharge Instructions (Signed)
Take Tylenol for mild pain or the pain medicine prescribed for bad pain. Don't take Tylenol together with the pain medicine prescribed as the combination can be dangerous to your liver. [facility] if having significant pain in 3 or 4 days. Return if concern for any reason
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
## 2017-01-20 NOTE — ED Notes (Signed)
Pt states she understands instrcutions. Home stable with son.Steady gait.

## 2017-02-12 ENCOUNTER — Telehealth: Payer: Self-pay | Admitting: Orthopaedic Surgery

## 2017-03-17 ENCOUNTER — Encounter: Payer: Self-pay | Admitting: Physical Therapy

## 2017-03-17 ENCOUNTER — Ambulatory Visit: Payer: Commercial Managed Care - PPO | Attending: Nurse Practitioner | Admitting: Physical Therapy

## 2017-03-17 DIAGNOSIS — M545 Low back pain: Secondary | ICD-10-CM | POA: Diagnosis present

## 2017-03-17 DIAGNOSIS — M6281 Muscle weakness (generalized): Secondary | ICD-10-CM | POA: Diagnosis present
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
## 2017-03-24 ENCOUNTER — Encounter: Payer: Self-pay | Admitting: Physical Therapy

## 2017-03-24 ENCOUNTER — Ambulatory Visit: Payer: Commercial Managed Care - PPO | Attending: Nurse Practitioner | Admitting: Physical Therapy

## 2017-03-24 DIAGNOSIS — M6281 Muscle weakness (generalized): Secondary | ICD-10-CM

## 2017-03-24 DIAGNOSIS — M545 Low back pain: Secondary | ICD-10-CM | POA: Insufficient documentation
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
## 2017-03-26 ENCOUNTER — Encounter: Payer: Self-pay | Admitting: Physical Therapy

## 2017-03-26 ENCOUNTER — Ambulatory Visit: Payer: Commercial Managed Care - PPO | Admitting: Physical Therapy

## 2017-03-26 DIAGNOSIS — M6281 Muscle weakness (generalized): Secondary | ICD-10-CM

## 2017-03-26 DIAGNOSIS — M545 Low back pain: Secondary | ICD-10-CM | POA: Diagnosis not present
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
## 2017-03-31 ENCOUNTER — Ambulatory Visit: Payer: Commercial Managed Care - PPO | Admitting: Physical Therapy

## 2017-03-31 ENCOUNTER — Encounter: Payer: Self-pay | Admitting: Physical Therapy

## 2017-03-31 DIAGNOSIS — M545 Low back pain: Secondary | ICD-10-CM

## 2017-03-31 DIAGNOSIS — M6281 Muscle weakness (generalized): Secondary | ICD-10-CM
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
## 2017-04-02 ENCOUNTER — Encounter: Payer: Self-pay | Admitting: Physical Therapy

## 2017-04-02 ENCOUNTER — Ambulatory Visit: Payer: Commercial Managed Care - PPO | Admitting: Physical Therapy

## 2017-04-02 DIAGNOSIS — M545 Low back pain: Secondary | ICD-10-CM

## 2017-04-02 DIAGNOSIS — M6281 Muscle weakness (generalized): Secondary | ICD-10-CM
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
## 2017-04-07 ENCOUNTER — Ambulatory Visit: Payer: Commercial Managed Care - PPO | Admitting: Physical Therapy

## 2017-04-14 ENCOUNTER — Ambulatory Visit: Payer: Commercial Managed Care - PPO | Admitting: Physical Therapy

## 2017-04-14 ENCOUNTER — Encounter: Payer: Self-pay | Admitting: Physical Therapy

## 2017-04-14 DIAGNOSIS — M545 Low back pain: Secondary | ICD-10-CM

## 2017-04-14 DIAGNOSIS — M6281 Muscle weakness (generalized): Secondary | ICD-10-CM
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
## 2017-04-16 ENCOUNTER — Encounter: Payer: Self-pay | Admitting: Physical Therapy

## 2017-04-16 ENCOUNTER — Ambulatory Visit: Payer: Commercial Managed Care - PPO | Admitting: Physical Therapy

## 2017-04-16 DIAGNOSIS — M545 Low back pain: Secondary | ICD-10-CM

## 2017-04-16 DIAGNOSIS — M6281 Muscle weakness (generalized): Secondary | ICD-10-CM
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
## 2017-04-21 ENCOUNTER — Ambulatory Visit: Payer: Commercial Managed Care - PPO | Attending: Nurse Practitioner | Admitting: Physical Therapy

## 2017-04-21 ENCOUNTER — Encounter: Payer: Self-pay | Admitting: Physical Therapy

## 2017-04-21 DIAGNOSIS — M6281 Muscle weakness (generalized): Secondary | ICD-10-CM

## 2017-04-21 DIAGNOSIS — M545 Low back pain: Secondary | ICD-10-CM | POA: Diagnosis present
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
## 2017-04-23 ENCOUNTER — Encounter: Payer: Self-pay | Admitting: Physical Therapy

## 2017-04-23 ENCOUNTER — Ambulatory Visit: Payer: Commercial Managed Care - PPO | Admitting: Physical Therapy

## 2017-04-23 DIAGNOSIS — M6281 Muscle weakness (generalized): Secondary | ICD-10-CM

## 2017-04-23 DIAGNOSIS — M545 Low back pain: Secondary | ICD-10-CM
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
## 2017-04-28 ENCOUNTER — Encounter: Payer: Self-pay | Admitting: Physical Therapy

## 2017-04-28 ENCOUNTER — Ambulatory Visit: Payer: Commercial Managed Care - PPO | Admitting: Physical Therapy

## 2017-04-28 DIAGNOSIS — M6281 Muscle weakness (generalized): Secondary | ICD-10-CM

## 2017-04-28 DIAGNOSIS — M545 Low back pain: Secondary | ICD-10-CM
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
## 2017-04-30 ENCOUNTER — Encounter: Payer: Commercial Managed Care - PPO | Admitting: Physical Therapy

## 2018-08-12 ENCOUNTER — Encounter (HOSPITAL_COMMUNITY): Payer: Self-pay | Admitting: Emergency Medicine

## 2018-08-12 ENCOUNTER — Emergency Department (HOSPITAL_COMMUNITY)
Admission: EM | Admit: 2018-08-12 | Discharge: 2018-08-12 | Disposition: A | Discharge status: Home / Self Care | Payer: Commercial Managed Care - PPO | Attending: Emergency Medicine | Admitting: Emergency Medicine

## 2018-08-12 ENCOUNTER — Other Ambulatory Visit: Payer: Self-pay

## 2018-08-12 ENCOUNTER — Emergency Department (HOSPITAL_COMMUNITY): Payer: Commercial Managed Care - PPO

## 2018-08-12 DIAGNOSIS — Z7982 Long term (current) use of aspirin: Secondary | ICD-10-CM | POA: Insufficient documentation

## 2018-08-12 DIAGNOSIS — J189 Pneumonia, unspecified organism: Secondary | ICD-10-CM | POA: Diagnosis not present

## 2018-08-12 DIAGNOSIS — Z79899 Other long term (current) drug therapy: Secondary | ICD-10-CM | POA: Diagnosis not present

## 2018-08-12 DIAGNOSIS — F17210 Nicotine dependence, cigarettes, uncomplicated: Secondary | ICD-10-CM | POA: Insufficient documentation

## 2018-08-12 DIAGNOSIS — R05 Cough: Secondary | ICD-10-CM | POA: Diagnosis present

## 2018-08-12 DIAGNOSIS — I10 Essential (primary) hypertension: Secondary | ICD-10-CM | POA: Diagnosis not present

## 2018-08-12 LAB — BASIC METABOLIC PANEL
Anion gap: 5 (ref 5–15)
BUN: 12 mg/dL (ref 6–20)
CO2: 24 mmol/L (ref 22–32)
CREATININE: 0.66 mg/dL (ref 0.44–1.00)
Calcium: 9 mg/dL (ref 8.9–10.3)
Chloride: 109 mmol/L (ref 98–111)
GFR calc non Af Amer: >60 mL/min (ref >60)
Glucose, Bld: 99 mg/dL (ref 70–99)
Potassium: 3.8 mmol/L (ref 3.5–5.1)
Sodium: 138 mmol/L (ref 135–145)

## 2018-08-12 LAB — CBC WITH DIFFERENTIAL/PLATELET
Abs Immature Granulocytes: 0.04 10*3/uL (ref 0.00–0.07)
BASOS ABS: 0 10*3/uL (ref 0.0–0.1)
Basophils Relative: 0 %
Eosinophils Absolute: 0.1 10*3/uL (ref 0.0–0.5)
Eosinophils Relative: 1 %
HCT: 31.7 % — ABNORMAL LOW (ref 36.0–46.0)
HEMOGLOBIN: 10.1 g/dL — AB (ref 12.0–15.0)
Immature Granulocytes: 0 %
LYMPHS ABS: 1.9 10*3/uL (ref 0.7–4.0)
Lymphocytes Relative: 17 %
MCH: 30.6 pg (ref 26.0–34.0)
MCHC: 31.9 g/dL (ref 30.0–36.0)
MCV: 96.1 fL (ref 80.0–100.0)
Monocytes Absolute: 0.7 10*3/uL (ref 0.1–1.0)
Monocytes Relative: 7 %
NEUTROS PCT: 75 %
NRBC: 0 % (ref 0.0–0.2)
Neutro Abs: 8.1 10*3/uL — ABNORMAL HIGH (ref 1.7–7.7)
Platelets: 292 10*3/uL (ref 150–400)
RBC: 3.3 MIL/uL — ABNORMAL LOW (ref 3.87–5.11)
RDW: 15.2 % (ref 11.5–15.5)
WBC: 10.9 10*3/uL — ABNORMAL HIGH (ref 4.0–10.5)

## 2018-08-12 LAB — CG4 I-STAT (LACTIC ACID): Lactic Acid, Venous: 0.93 mmol/L (ref 0.5–1.9)

## 2018-08-12 MED ORDER — AZITHROMYCIN 250 MG PO TABS
500.0000 mg | ORAL_TABLET | Freq: Once | ORAL | Status: AC
Start: 2018-08-12 — End: 2018-08-12
  Administered 2018-08-12: 500 mg via ORAL
  Filled 2018-08-12: qty 2

## 2018-08-12 MED ORDER — AZITHROMYCIN 250 MG PO TABS: 250.0000 mg | ORAL_TABLET | Freq: Every day | ORAL | 0 refills | Status: DC

## 2018-08-12 MED ORDER — SODIUM CHLORIDE 0.9 % IV BOLUS
500.0000 mL | Freq: Once | INTRAVENOUS | Status: AC
  Administered 2018-08-12: 500 mL via INTRAVENOUS

## 2018-08-12 MED ORDER — SODIUM CHLORIDE 0.9 % IV SOLN
1.0000 g | Freq: Once | INTRAVENOUS | Status: AC
  Administered 2018-08-12: 1 g via INTRAVENOUS
  Filled 2018-08-12: qty 10

## 2018-08-12 MED ORDER — IBUPROFEN 400 MG PO TABS
400.0000 mg | ORAL_TABLET | Freq: Once | ORAL | Status: AC
  Administered 2018-08-12: 400 mg via ORAL
  Filled 2018-08-12: qty 1

## 2018-08-12 MED ORDER — AMOXICILLIN-POT CLAVULANATE 875-125 MG PO TABS: 1.0000 | ORAL_TABLET | Freq: Two times a day (BID) | ORAL | 0 refills | Status: DC

## 2018-08-12 NOTE — Discharge Instructions (Signed)
Make sure that you are getting plenty of rest, and drinking a lot of fluids.  Use Tylenol for pain or fever.  Use Robitussin-DM for cough.

## 2018-08-12 NOTE — ED Triage Notes (Signed)
Patient complaining of cough and shortness of breath x 2 days. States she is coughing up "brownish" colored sputum.

## 2018-08-12 NOTE — ED Provider Notes (Signed)
[facility] EMERGENCY DEPARTMENT Provider Note   CSN: [id number] Arrival date & time: 08/12/18  8295     History   Chief Complaint Chief Complaint  Patient presents with  . Cough    HPI Donna Fitzgerald is a 52 y.o. female.  HPI   She complains of illness for 2 weeks with cough productive of brown sputum.  No bloody emesis.  She denies fever, chills, weakness or dizziness.  She has been more short of breath in the last 2 days than usual.  She continues to smoke cigarettes.  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  No other recent illnesses.  No chronic lung disorders.  There are no other known modifying factors.  Past Medical History:  Diagnosis Date  . Arthritis   . Hypertension     Patient Active Problem List   Diagnosis Date Noted  . Insomnia 01/19/2013  . Low back pain 01/19/2013  . Hypertension     Past Surgical History:  Procedure Laterality Date  . BACK SURGERY    . HAND SURGERY Right 1995     OB History   No obstetric history on file.      Home Medications    Prior to Admission medications   Medication Sig Start Date End Date Taking? Authorizing Provider  acetaminophen (TYLENOL) 650 MG CR tablet Take 650 mg by mouth every 8 (eight) hours as needed for pain. Takes two    [provider]  albuterol (PROVENTIL HFA;VENTOLIN HFA) 108 (90 Base) MCG/ACT inhaler Inhale 2 puffs into the lungs every 6 (six) hours as needed.    [provider]  amitriptyline (ELAVIL) 100 MG tablet TAKE 1 TABLET (100 MG TOTAL) BY MOUTH AT BEDTIME. 11/14/13   Allayne Butcher B, PA-C  amLODipine (NORVASC) 10 MG tablet Take 1 tablet (10 mg total) by mouth daily. 01/19/13   Allayne Butcher B, PA-C  amoxicillin-clavulanate (AUGMENTIN) 875-125 MG tablet Take 1 tablet by mouth 2 (two) times daily. One po bid x 7 days.  Start 08/13/2018 08/06/18   Mancel Bale, MD  aspirin 81 MG tablet Take 81 mg by mouth daily.    [provider]    azithromycin (ZITHROMAX) 250 MG tablet Take 1 tablet (250 mg total) by mouth daily. Start 08/13/2018 08/13/18   Mancel Bale, MD  cyclobenzaprine (FLEXERIL) 10 MG tablet TAKE 1 TABLET (10 MG TOTAL) BY MOUTH 3 (THREE) TIMES DAILY AS NEEDED FOR MUSCLE SPASMS. 06/03/13   Allayne Butcher B, PA-C  enalapril (VASOTEC) 20 MG tablet TAKE 1 TABLET BY MOUTH EVERY DAY Patient not taking: Reported on 03/17/2017    Allayne Butcher B, PA-C  gabapentin (NEURONTIN) 300 MG capsule Take 300 mg by mouth 2 (two) times daily.    [provider]  HYDROcodone-acetaminophen (NORCO) 5-325 MG tablet Take 1-2 tablets by mouth every 6 (six) hours as needed for severe pain. Patient not taking: Reported on 03/17/2017 01/20/17   Doug Sou, MD  lidocaine (LIDODERM) 5 % Place 1 patch onto the skin daily. Remove & Discard patch within 12 hours or as directed by MD    [provider]  meloxicam (MOBIC) 7.5 MG tablet TAKE 1 TABLET BY MOUTH TWICE A DAY WITH FOOD Patient not taking: Reported on 03/17/2017 10/28/13   Dorena Bodo, PA-C  methocarbamol (ROBAXIN) 500 MG tablet Take 500 mg by mouth 2 (two) times daily after a meal.    [provider]  metoprolol (LOPRESSOR) 50  MG tablet TAKE 1 TABLET BY MOUTH TWICE A DAY 11/14/13   Dixon, Mary B, PA-C  naproxen (NAPROSYN) 500 MG tablet TAKE 1 TABLET (500 MG TOTAL) BY MOUTH 2 (TWO) TIMES DAILY WITH A MEAL. 02/12/17   Darreld McleanKeeling, Wayne, MD  pravastatin (PRAVACHOL) 80 MG tablet Take 80 mg by mouth daily.    [provider]    Family History Family History  Problem Relation Age of Onset  . Hypertension Mother   . Heart disease Father   . Hypertension Father   . Heart disease Sister   . Hypertension Sister     Social History Social History   Tobacco Use  . Smoking status: Current Every Day Smoker    Packs/day: 0.50    Years: 30.00    Pack years: 15.00    Types: Cigarettes  . Smokeless tobacco: Never Used  Substance Use Topics  . Alcohol use: Yes     Comment: 2 beverages per week  . Drug use: No     Allergies   Patient has no known allergies.   Review of Systems Review of Systems  All other systems reviewed and are negative.    Physical Exam Updated Vital Signs BP 121/62 (BP Location: Left Arm)   Pulse 79   Temp 98.4 F (36.9 C) (Oral)   Resp 17   Ht 5\' 2"  (1.575 m)   Wt 88.5 kg   LMP 05/04/2013   SpO2 95%   BMI 35.67 kg/m   Physical Exam Vitals signs and nursing note reviewed.  Constitutional:      Appearance: She is well-developed. She is not ill-appearing.  HENT:     Head: Normocephalic and atraumatic.     Right Ear: External ear normal.     Left Ear: External ear normal.     Nose: Nose normal.     Mouth/Throat:     Mouth: Mucous membranes are moist.  Eyes:     Conjunctiva/sclera: Conjunctivae normal.     Pupils: Pupils are equal, round, and reactive to light.  Neck:     Musculoskeletal: Normal range of motion and neck supple.     Trachea: Phonation normal.  Cardiovascular:     Rate and Rhythm: Normal rate and regular rhythm.     Heart sounds: Normal heart sounds.  Pulmonary:     Effort: Pulmonary effort is normal. No respiratory distress.     Breath sounds: No stridor. Rhonchi present.     Comments: No increased work of breathing. Abdominal:     Palpations: Abdomen is soft.     Tenderness: There is no abdominal tenderness.  Musculoskeletal: Normal range of motion.  Skin:    General: Skin is warm and dry.  Neurological:     Mental Status: She is alert and oriented to person, place, and time.     Cranial Nerves: No cranial nerve deficit.     Sensory: No sensory deficit.     Motor: No abnormal muscle tone.     Coordination: Coordination normal.  Psychiatric:        Behavior: Behavior normal.        Thought Content: Thought content normal.        Judgment: Judgment normal.      ED Treatments / Results  Labs (all labs ordered are listed, but only abnormal results are displayed) Labs  Reviewed  CBC WITH DIFFERENTIAL/PLATELET - Abnormal; Notable for the following components:      Result Value   WBC 10.9 (*)  RBC 3.30 (*)    Hemoglobin 10.1 (*)    HCT 31.7 (*)    Neutro Abs 8.1 (*)    All other components within normal limits  BASIC METABOLIC PANEL  I-STAT CG4 LACTIC ACID, ED  CG4 I-STAT (LACTIC ACID)  I-STAT CG4 LACTIC ACID, ED    EKG None  Radiology Dg Chest 2 View  Result Date: 08/12/2018 CLINICAL DATA:  Productive cough for 2 weeks. Shortness of breath for 2-3 days. EXAM: CHEST - 2 VIEW COMPARISON:  PA and lateral chest 01/20/2017 and 12/07/2016. FINDINGS: Patchy bilateral alveolar and interstitial opacities are identified. Trace amount of pleural fluid is noted. Heart size is upper normal. No pneumothorax. No acute or focal bony abnormality. IMPRESSION: Patchy bilateral alveolar and interstitial opacities have an appearance most suggestive of pulmonary edema but could be due to multifocal pneumonia. Electronically Signed   By: Drusilla Kannerhomas  Dalessio M.D.   On: 08/12/2018 09:23    Procedures Procedures (including critical care time)  Medications Ordered in ED Medications  sodium chloride 0.9 % bolus 500 mL (0 mLs Intravenous Stopped 08/12/18 1311)  cefTRIAXone (ROCEPHIN) 1 g in sodium chloride 0.9 % 100 mL IVPB (0 g Intravenous Stopped 08/12/18 1312)  azithromycin (ZITHROMAX) tablet 500 mg (500 mg Oral Given 08/12/18 1112)  ibuprofen (ADVIL,MOTRIN) tablet 400 mg (400 mg Oral Given 08/12/18 1309)     Initial Impression / Assessment and Plan / ED Course  I have reviewed the triage vital signs and the nursing notes.  Pertinent labs & imaging results that were available during my care of the patient were reviewed by me and considered in my medical decision making (see chart for details).      Patient Vitals for the past 24 hrs:  BP Temp Temp src Pulse Resp SpO2 Height Weight  08/12/18 1514 121/62 - - 79 17 95 % - -  08/12/18 1311 122/67 - - 83 - 95 % - -    08/12/18 1119 126/77 - - 82 19 95 % - -  08/12/18 0910 134/84 98.4 F (36.9 C) Oral (!) 101 20 95 % 5\' 2"  (1.575 m) 88.5 kg    At D/C Reevaluation with update and discussion. After initial assessment and treatment, an updated evaluation reveals she is comfortable, lying supine, without oxygen and no O2 requirement.  Findings discussed and questions answered.Mancel Bale. Khari Mally   Medical Decision Making: Evaluation consistent with pneumonia, likely atypical.  Doubt serious bacterial infection, metabolic instability or impending vascular collapse.  CRITICAL CARE-no Performed by: Mancel BaleElliott Nereyda Bowler  Nursing Notes Reviewed/ Care Coordinated Applicable Imaging Reviewed Interpretation of Laboratory Data incorporated into ED treatment  The patient appears reasonably screened and/or stabilized for discharge and I doubt any other medical condition or other [facility] requiring further screening, evaluation, or treatment in the ED at this time prior to discharge.  Plan: Home Medications-OTC cough medicine and antipyretic of choice; Home Treatments-stop smoking, rest and fluids; return here if the recommended treatment, does not improve the symptoms; Recommended follow up-PCP, PRN    Final Clinical Impressions(s) / ED Diagnoses   Final diagnoses:  None    ED Discharge Orders         Ordered    azithromycin (ZITHROMAX) 250 MG tablet  Daily     08/12/18 1447    amoxicillin-clavulanate (AUGMENTIN) 875-125 MG tablet  2 times daily     08/12/18 1447           Mancel BaleWentz, Aaleeyah Bias, MD 08/12/18 1534

## 2018-09-02 ENCOUNTER — Other Ambulatory Visit (HOSPITAL_COMMUNITY): Payer: Self-pay | Admitting: Nurse Practitioner

## 2018-09-02 DIAGNOSIS — Z1231 Encounter for screening mammogram for malignant neoplasm of breast: Secondary | ICD-10-CM

## 2018-10-26 ENCOUNTER — Emergency Department (HOSPITAL_COMMUNITY): Payer: Commercial Managed Care - PPO

## 2018-10-26 ENCOUNTER — Other Ambulatory Visit: Payer: Self-pay

## 2018-10-26 ENCOUNTER — Encounter (HOSPITAL_COMMUNITY): Payer: Self-pay | Admitting: Emergency Medicine

## 2018-10-26 ENCOUNTER — Inpatient Hospital Stay (HOSPITAL_COMMUNITY)
Admission: EM | Admit: 2018-10-26 | Discharge: 2018-10-29 | DRG: 193 | Disposition: A | Discharge status: Home / Self Care | Payer: Commercial Managed Care - PPO | Attending: Family Medicine | Admitting: Family Medicine

## 2018-10-26 DIAGNOSIS — Z7982 Long term (current) use of aspirin: Secondary | ICD-10-CM | POA: Diagnosis not present

## 2018-10-26 DIAGNOSIS — Z720 Tobacco use: Secondary | ICD-10-CM | POA: Diagnosis not present

## 2018-10-26 DIAGNOSIS — J9601 Acute respiratory failure with hypoxia: Secondary | ICD-10-CM

## 2018-10-26 DIAGNOSIS — J101 Influenza due to other identified influenza virus with other respiratory manifestations: Secondary | ICD-10-CM | POA: Diagnosis present

## 2018-10-26 DIAGNOSIS — M549 Dorsalgia, unspecified: Secondary | ICD-10-CM | POA: Diagnosis present

## 2018-10-26 DIAGNOSIS — J1000 Influenza due to other identified influenza virus with unspecified type of pneumonia: Secondary | ICD-10-CM | POA: Diagnosis not present

## 2018-10-26 DIAGNOSIS — Z8249 Family history of ischemic heart disease and other diseases of the circulatory system: Secondary | ICD-10-CM | POA: Diagnosis not present

## 2018-10-26 DIAGNOSIS — I10 Essential (primary) hypertension: Secondary | ICD-10-CM

## 2018-10-26 DIAGNOSIS — J189 Pneumonia, unspecified organism: Secondary | ICD-10-CM | POA: Diagnosis not present

## 2018-10-26 DIAGNOSIS — F17210 Nicotine dependence, cigarettes, uncomplicated: Secondary | ICD-10-CM | POA: Diagnosis present

## 2018-10-26 DIAGNOSIS — G8929 Other chronic pain: Secondary | ICD-10-CM | POA: Diagnosis present

## 2018-10-26 DIAGNOSIS — J440 Chronic obstructive pulmonary disease with acute lower respiratory infection: Secondary | ICD-10-CM | POA: Diagnosis present

## 2018-10-26 DIAGNOSIS — R000 Tachycardia, unspecified: Secondary | ICD-10-CM

## 2018-10-26 DIAGNOSIS — Z791 Long term (current) use of non-steroidal anti-inflammatories (NSAID): Secondary | ICD-10-CM | POA: Diagnosis not present

## 2018-10-26 DIAGNOSIS — R0902 Hypoxemia: Secondary | ICD-10-CM

## 2018-10-26 HISTORY — DX: Influenza due to other identified influenza virus with other respiratory manifestations: J10.1

## 2018-10-26 HISTORY — DX: Pneumonia, unspecified organism: J18.9

## 2018-10-26 HISTORY — DX: Acute respiratory failure with hypoxia: J96.01

## 2018-10-26 LAB — BASIC METABOLIC PANEL
Anion gap: 7 (ref 5–15)
BUN: 11 mg/dL (ref 6–20)
CO2: 22 mmol/L (ref 22–32)
Calcium: 8.4 mg/dL — ABNORMAL LOW (ref 8.9–10.3)
Chloride: 107 mmol/L (ref 98–111)
Creatinine, Ser: 0.7 mg/dL (ref 0.44–1.00)
GFR calc Af Amer: >60 mL/min (ref >60)
GFR calc non Af Amer: >60 mL/min (ref >60)
Glucose, Bld: 106 mg/dL — ABNORMAL HIGH (ref 70–99)
Potassium: 3.7 mmol/L (ref 3.5–5.1)
Sodium: 136 mmol/L (ref 135–145)

## 2018-10-26 LAB — LACTIC ACID, PLASMA
Lactic Acid, Venous: 1.3 mmol/L (ref 0.5–1.9)
Lactic Acid, Venous: 1 mmol/L (ref 0.5–1.9)

## 2018-10-26 LAB — TROPONIN I: Troponin I: <0.03 ng/mL (ref <0.03)

## 2018-10-26 LAB — CBC WITH DIFFERENTIAL/PLATELET
Abs Immature Granulocytes: 0.03 10*3/uL (ref 0.00–0.07)
BASOS ABS: 0 10*3/uL (ref 0.0–0.1)
Basophils Relative: 0 %
EOS PCT: 0 %
Eosinophils Absolute: 0 10*3/uL (ref 0.0–0.5)
HCT: 36.8 % (ref 36.0–46.0)
Hemoglobin: 11.8 g/dL — ABNORMAL LOW (ref 12.0–15.0)
Immature Granulocytes: 0 %
Lymphocytes Relative: 9 %
Lymphs Abs: 0.8 10*3/uL (ref 0.7–4.0)
MCH: 30.3 pg (ref 26.0–34.0)
MCHC: 32.1 g/dL (ref 30.0–36.0)
MCV: 94.4 fL (ref 80.0–100.0)
Monocytes Absolute: 1 10*3/uL (ref 0.1–1.0)
Monocytes Relative: 12 %
NRBC: 0 % (ref 0.0–0.2)
Neutro Abs: 6.6 10*3/uL (ref 1.7–7.7)
Neutrophils Relative %: 79 %
Platelets: 231 10*3/uL (ref 150–400)
RBC: 3.9 MIL/uL (ref 3.87–5.11)
RDW: 15.6 % — ABNORMAL HIGH (ref 11.5–15.5)
WBC: 8.4 10*3/uL (ref 4.0–10.5)

## 2018-10-26 LAB — INFLUENZA PANEL BY PCR (TYPE A & B)
Influenza A By PCR: POSITIVE — AB
Influenza B By PCR: NEGATIVE

## 2018-10-26 LAB — BRAIN NATRIURETIC PEPTIDE: B NATRIURETIC PEPTIDE 5: 181 pg/mL — AB (ref 0.0–100.0)

## 2018-10-26 MED ORDER — METHOCARBAMOL 500 MG PO TABS
500.0000 mg | ORAL_TABLET | Freq: Two times a day (BID) | ORAL | Status: DC
  Administered 2018-10-26 – 2018-10-29 (×6): 500 mg via ORAL
  Filled 2018-10-26 (×6): qty 1

## 2018-10-26 MED ORDER — AMITRIPTYLINE HCL 25 MG PO TABS
100.0000 mg | ORAL_TABLET | Freq: Every day | ORAL | Status: DC
  Administered 2018-10-26 – 2018-10-28 (×3): 100 mg via ORAL
  Filled 2018-10-26 (×3): qty 4

## 2018-10-26 MED ORDER — ENOXAPARIN SODIUM 40 MG/0.4ML ~~LOC~~ SOLN
40.0000 mg | SUBCUTANEOUS | Status: DC
  Administered 2018-10-26 – 2018-10-28 (×3): 40 mg via SUBCUTANEOUS
  Filled 2018-10-26 (×4): qty 0.4

## 2018-10-26 MED ORDER — POTASSIUM CHLORIDE IN NACL 20-0.9 MEQ/L-% IV SOLN
INTRAVENOUS | Status: DC
  Administered 2018-10-26 – 2018-10-27 (×2): via INTRAVENOUS

## 2018-10-26 MED ORDER — PRAVASTATIN SODIUM 40 MG PO TABS
80.0000 mg | ORAL_TABLET | Freq: Every day | ORAL | Status: DC
  Administered 2018-10-26 – 2018-10-28 (×3): 80 mg via ORAL
  Filled 2018-10-26 (×2): qty 2

## 2018-10-26 MED ORDER — ASPIRIN 81 MG PO CHEW
81.0000 mg | CHEWABLE_TABLET | Freq: Every day | ORAL | Status: DC
  Administered 2018-10-26 – 2018-10-29 (×4): 81 mg via ORAL
  Filled 2018-10-26 (×4): qty 1

## 2018-10-26 MED ORDER — SODIUM CHLORIDE 0.9 % IV BOLUS
1000.0000 mL | Freq: Once | INTRAVENOUS | Status: AC
  Administered 2018-10-26: 1000 mL via INTRAVENOUS

## 2018-10-26 MED ORDER — SODIUM CHLORIDE 0.9 % IV SOLN: 500.0000 mg | INTRAVENOUS | Status: DC

## 2018-10-26 MED ORDER — IPRATROPIUM-ALBUTEROL 0.5-2.5 (3) MG/3ML IN SOLN
3.0000 mL | Freq: Four times a day (QID) | RESPIRATORY_TRACT | Status: DC
  Administered 2018-10-26 – 2018-10-27 (×2): 3 mL via RESPIRATORY_TRACT
  Filled 2018-10-26 (×2): qty 3

## 2018-10-26 MED ORDER — IOHEXOL 350 MG/ML SOLN
100.0000 mL | Freq: Once | INTRAVENOUS | Status: AC | PRN
  Administered 2018-10-26: 100 mL via INTRAVENOUS

## 2018-10-26 MED ORDER — ACETAMINOPHEN 325 MG PO TABS
650.0000 mg | ORAL_TABLET | Freq: Four times a day (QID) | ORAL | Status: DC | PRN
  Administered 2018-10-26 – 2018-10-28 (×2): 650 mg via ORAL
  Filled 2018-10-26 (×2): qty 2

## 2018-10-26 MED ORDER — OSELTAMIVIR PHOSPHATE 75 MG PO CAPS
75.0000 mg | ORAL_CAPSULE | Freq: Two times a day (BID) | ORAL | Status: DC
  Administered 2018-10-26 – 2018-10-29 (×6): 75 mg via ORAL
  Filled 2018-10-26 (×7): qty 1

## 2018-10-26 MED ORDER — IPRATROPIUM-ALBUTEROL 0.5-2.5 (3) MG/3ML IN SOLN
3.0000 mL | Freq: Once | RESPIRATORY_TRACT | Status: AC
  Administered 2018-10-26: 3 mL via RESPIRATORY_TRACT
  Filled 2018-10-26: qty 3

## 2018-10-26 MED ORDER — SODIUM CHLORIDE 0.9 % IV SOLN: 1.0000 g | INTRAVENOUS | Status: DC

## 2018-10-26 MED ORDER — SODIUM CHLORIDE 0.9 % IV SOLN
500.0000 mg | INTRAVENOUS | Status: DC
  Administered 2018-10-26: 500 mg via INTRAVENOUS
  Filled 2018-10-26: qty 500

## 2018-10-26 MED ORDER — KETOROLAC TROMETHAMINE 30 MG/ML IJ SOLN
30.0000 mg | Freq: Four times a day (QID) | INTRAMUSCULAR | Status: DC | PRN
  Administered 2018-10-26 – 2018-10-27 (×2): 30 mg via INTRAVENOUS
  Filled 2018-10-26 (×2): qty 1

## 2018-10-26 MED ORDER — BUDESONIDE 0.25 MG/2ML IN SUSP
0.2500 mg | Freq: Two times a day (BID) | RESPIRATORY_TRACT | Status: DC
  Administered 2018-10-26 – 2018-10-29 (×6): 0.25 mg via RESPIRATORY_TRACT
  Filled 2018-10-26 (×6): qty 2

## 2018-10-26 MED ORDER — GUAIFENESIN ER 600 MG PO TB12
1200.0000 mg | ORAL_TABLET | Freq: Two times a day (BID) | ORAL | Status: DC
  Administered 2018-10-26 – 2018-10-29 (×6): 1200 mg via ORAL
  Filled 2018-10-26 (×6): qty 2

## 2018-10-26 MED ORDER — METOPROLOL TARTRATE 50 MG PO TABS
50.0000 mg | ORAL_TABLET | Freq: Two times a day (BID) | ORAL | Status: DC
  Administered 2018-10-26 – 2018-10-29 (×6): 50 mg via ORAL
  Filled 2018-10-26 (×6): qty 1

## 2018-10-26 MED ORDER — SODIUM CHLORIDE 0.9 % IV SOLN
1.0000 g | Freq: Once | INTRAVENOUS | Status: AC
  Administered 2018-10-26: 1 g via INTRAVENOUS
  Filled 2018-10-26: qty 10

## 2018-10-26 MED ORDER — ACETAMINOPHEN 500 MG PO TABS
1000.0000 mg | ORAL_TABLET | Freq: Once | ORAL | Status: AC
  Administered 2018-10-26: 1000 mg via ORAL
  Filled 2018-10-26: qty 2

## 2018-10-26 MED ORDER — GABAPENTIN 300 MG PO CAPS
300.0000 mg | ORAL_CAPSULE | Freq: Two times a day (BID) | ORAL | Status: DC
  Administered 2018-10-26 – 2018-10-29 (×7): 300 mg via ORAL
  Filled 2018-10-26 (×7): qty 1

## 2018-10-26 MED ORDER — ONDANSETRON HCL 4 MG/2ML IJ SOLN: 4.0000 mg | Freq: Four times a day (QID) | INTRAMUSCULAR | Status: DC | PRN

## 2018-10-26 NOTE — H&P (Addendum)
History and Physical    CARALYN TWINING ZOX:[id number] DOB: 26-Apr-1966 DOA: 10/26/2018  [facility] [place]  Patient coming from: Home  I have personally briefly reviewed patient's old medical records in [facility]  Chief Complaint: Shortness of breath, cough  HPI: LYNELLE WEILER is a 53 y.o. female with medical history significant of hypertension, back pain, chronic tobacco use, presents to the hospital with shortness of breath and cough.  She reports that she has had the symptoms for the past 4 days.  She has had cough productive of clear sputum.  She has had worsening shortness of breath and wheezing.  She has had nausea, but no vomiting.  No diarrhea.  She does have a headache.  She is felt generally weak.  She has not had any travel history.  She is unaware of having any contacts with similar symptoms.  ED Course: On arrival to the emergency room, she was noted to be febrile, tachycardic and hypoxic on ambulation.  CT chest performed shows multifocal pneumonia.  Patient was noted to be wheezing, received nebulizer treatments and was started on antibiotics.  Review of Systems:  General: Positive for fever, malaise Respiratory: Positive for cough, shortness of breath, wheezing Cardiac: Negative for chest pain GI: Positive for nausea, negative for vomiting, negative for diarrhea Remainder of systems were checked and found to be negative.   Past Medical History:  Diagnosis Date  . Arthritis   . Hypertension     Past Surgical History:  Procedure Laterality Date  . BACK SURGERY    . HAND SURGERY Right 1995    Social History:  reports that she has been smoking cigarettes. She has a 15.00 pack-year smoking history. She has never used smokeless tobacco. She reports current alcohol use. She reports that she does not use drugs.  No Known Allergies  Family History  Problem Relation Age of Onset  . Hypertension Mother   . Heart disease Father   .  Hypertension Father   . Heart disease Sister   . Hypertension Sister     Prior to Admission medications   Medication Sig Start Date End Date Taking? Authorizing Provider  acetaminophen (TYLENOL) 650 MG CR tablet Take 650 mg by mouth every 8 (eight) hours as needed for pain. Takes two   Yes [provider]  albuterol (PROVENTIL HFA;VENTOLIN HFA) 108 (90 Base) MCG/ACT inhaler Inhale 2 puffs into the lungs every 6 (six) hours as needed.   Yes [provider]  amitriptyline (ELAVIL) 100 MG tablet TAKE 1 TABLET (100 MG TOTAL) BY MOUTH AT BEDTIME. 11/14/13  Yes Dixon, Mary B, PA-C  amLODipine (NORVASC) 10 MG tablet Take 1 tablet (10 mg total) by mouth daily. 01/19/13  Yes Dorena Bodo, PA-C  aspirin 81 MG tablet Take 81 mg by mouth daily.   Yes [provider]  enalapril (VASOTEC) 20 MG tablet TAKE 1 TABLET BY MOUTH EVERY DAY   Yes Dixon, Mary B, PA-C  meloxicam (MOBIC) 7.5 MG tablet TAKE 1 TABLET BY MOUTH TWICE A DAY WITH FOOD 10/28/13  Yes Allayne Butcher B, PA-C  methocarbamol (ROBAXIN) 500 MG tablet Take 500 mg by mouth 2 (two) times daily after a meal.   Yes [provider]  metoprolol (LOPRESSOR) 50 MG tablet TAKE 1 TABLET BY MOUTH TWICE A DAY 11/14/13  Yes Dixon, Mary B, PA-C  naproxen (NAPROSYN) 500 MG tablet TAKE 1 TABLET (500 MG TOTAL) BY MOUTH 2 (TWO) TIMES DAILY WITH A MEAL.  02/12/17  Yes Darreld Mclean, MD  pravastatin (PRAVACHOL) 80 MG tablet Take 80 mg by mouth daily.   Yes [provider]  cyclobenzaprine (FLEXERIL) 10 MG tablet TAKE 1 TABLET (10 MG TOTAL) BY MOUTH 3 (THREE) TIMES DAILY AS NEEDED FOR MUSCLE SPASMS. Patient not taking: Reported on 10/26/2018 06/03/13   Allayne Butcher B, PA-C  gabapentin (NEURONTIN) 300 MG capsule Take 300 mg by mouth 2 (two) times daily.    [provider]  HYDROcodone-acetaminophen (NORCO) 5-325 MG tablet Take 1-2 tablets by mouth every 6 (six) hours as needed for severe pain. Patient not taking: Reported on  03/17/2017 01/20/17   Doug Sou, MD    Physical Exam: Vitals:   10/26/18 1500 10/26/18 1530 10/26/18 1600 10/26/18 1630  BP: (!) 150/80 135/90 128/66 (!) 144/90  Pulse: (!) 115 (!) 117 (!) 115 (!) 123  Resp: (!) 33 (!) 31 (!) 28 (!) 34  Temp:      TempSrc:      SpO2: 94% 96% 93% 92%  Weight:      Height:        Constitutional: NAD, calm, comfortable Eyes: PERRL, lids and conjunctivae normal ENMT: Mucous membranes are moist. Posterior pharynx clear of any exudate or lesions.Normal dentition.  Neck: normal, supple, no masses, no thyromegaly Respiratory: Bilateral wheezes and rhonchi. Normal respiratory effort. No accessory muscle use.  Cardiovascular: Tachycardic, regular rhythm. No extremity edema. 2+ pedal pulses. No carotid bruits.  Abdomen: no tenderness, no masses palpated. No hepatosplenomegaly. Bowel sounds positive.  Musculoskeletal: no clubbing / cyanosis. No joint deformity upper and lower extremities. Good ROM, no contractures. Normal muscle tone.  Skin: no rashes, lesions, ulcers. No induration Neurologic: CN 2-12 grossly intact. Sensation intact, DTR normal. Strength 5/5 in all 4.  Psychiatric: Normal judgment and insight. Alert and oriented x 3. Normal mood.    Labs on Admission: I have personally reviewed following labs and imaging studies  CBC: Recent Labs  Lab 10/26/18 1003  WBC 8.4  NEUTROABS 6.6  HGB 11.8*  HCT 36.8  MCV 94.4  PLT 231   Basic Metabolic Panel: Recent Labs  Lab 10/26/18 1003  NA 136  K 3.7  CL 107  CO2 22  GLUCOSE 106*  BUN 11  CREATININE 0.70  CALCIUM 8.4*   GFR: Estimated Creatinine Clearance: 98.2 mL/min (by C-G formula based on SCr of 0.7 mg/dL). Liver Function Tests: No results for input(s): AST, ALT, ALKPHOS, BILITOT, PROT, ALBUMIN in the last 168 hours. No results for input(s): LIPASE, AMYLASE in the last 168 hours. No results for input(s): AMMONIA in the last 168 hours. Coagulation Profile: No results for  input(s): INR, PROTIME in the last 168 hours. Cardiac Enzymes: Recent Labs  Lab 10/26/18 1003  TROPONINI <0.03   BNP (last 3 results) No results for input(s): PROBNP in the last 8760 hours. HbA1C: No results for input(s): HGBA1C in the last 72 hours. CBG: No results for input(s): GLUCAP in the last 168 hours. Lipid Profile: No results for input(s): CHOL, HDL, LDLCALC, TRIG, CHOLHDL, LDLDIRECT in the last 72 hours. Thyroid Function Tests: No results for input(s): TSH, T4TOTAL, FREET4, T3FREE, THYROIDAB in the last 72 hours. Anemia Panel: No results for input(s): VITAMINB12, FOLATE, FERRITIN, TIBC, IRON, RETICCTPCT in the last 72 hours. Urine analysis: No results found for: COLORURINE, APPEARANCEUR, LABSPEC, PHURINE, GLUCOSEU, HGBUR, BILIRUBINUR, KETONESUR, PROTEINUR, UROBILINOGEN, NITRITE, LEUKOCYTESUR  Radiological Exams on Admission: Dg Chest 2 View  Result Date: 10/26/2018 CLINICAL DATA:  53 year old female with a  history of cough and shortness of breath EXAM: CHEST - 2 VIEW COMPARISON:  08/12/2018, 01/20/2017 FINDINGS: Reticulonodular opacities throughout the lungs without confluent airspace disease. No evidence of pleural effusion or pneumothorax. Cardiomediastinal silhouette within normal limits. Degenerative changes of the spine IMPRESSION: Reticulonodular pattern of opacity suggests bronchopneumonia. Electronically Signed   By: Gilmer Mor D.O.   On: 10/26/2018 10:04   Ct Angio Chest Pe W And/or Wo Contrast  Result Date: 10/26/2018 CLINICAL DATA:  Shortness of Breath EXAM: CT ANGIOGRAPHY CHEST WITH CONTRAST TECHNIQUE: Multidetector CT imaging of the chest was performed using the standard protocol during bolus administration of intravenous contrast. Multiplanar CT image reconstructions and MIPs were obtained to evaluate the vascular anatomy. CONTRAST:  OMNIPAQUE IOHEXOL 350 MG/ML SOLN COMPARISON:  Chest radiograph October 26, 2018 FINDINGS: Cardiovascular: There is no  demonstrable pulmonary embolus. There is no thoracic aortic aneurysm or dissection. There is mild calcification in the proximal left common carotid artery. Visualized great vessels otherwise appear unremarkable. There are occasional foci of coronary artery calcification. There is no pericardial effusion or pericardial thickening. Mediastinum/Nodes: Visualized thyroid appears normal. There are several subcentimeter mediastinal lymph nodes. There is a pretracheal lymph node measuring 1.6 x 1.4 cm. There is a subcarinal lymph node measuring 1.8 x 1.6 cm. A second subcarinal lymph node measures 1.4 x 1.1 cm. There is a superior left hilar lymph node measuring 1.5 x 1.4 cm. There is a right hilar lymph node measuring 1.0 x 1.0 cm. There is a lymph node anterior to the carina measuring 1.2 x 1.0 cm. There is a small hiatal hernia. Lungs/Pleura: There is a focal area of airspace consolidation in the posterior segment of the left lower lobe. There is lower lobe atelectatic change as well. There is slight tree on but appearance in the posterior segment right upper lobe, felt to represent pneumonia. Similar appearance is also noted in the inferior lingula. There is a mild degree of lower lobe bronchiectatic change bilaterally. There are somewhat ill-defined nodular opacities in the right upper lobe measuring between 4 and 5 mm, best seen on axial slices 33, 34, and 35 series 6. Upper Abdomen: There is hepatic steatosis. Visualized upper abdominal structures otherwise appear unremarkable. Musculoskeletal: There are no blastic or lytic bone lesions. No evident chest wall lesions. Review of the MIP images confirms the above findings. IMPRESSION: 1. No demonstrable pulmonary embolus. No thoracic aortic aneurysm or dissection. There are foci of coronary artery calcification. 2. Multifocal pneumonia as noted. Several 4-5 mm nodular opacities are noted in the right upper lobe which potentially could be related to pneumonia. No  follow-up needed if patient is low-risk (and has no known or suspected primary neoplasm). Non-contrast chest CT can be considered in 12 months if patient is high-risk. This recommendation follows the consensus statement: Guidelines for Management of Incidental Pulmonary Nodules Detected on CT Images: From the Fleischner Society 2017; Radiology 2017; 284:228-243. There is mild lower lobe bronchiectatic change. 3. Multifocal adenopathy of uncertain etiology. Adenopathy potentially could be secondary to pneumonia. 4.  Small hiatal hernia. 5.  Hepatic steatosis. Electronically Signed   By: Bretta Bang III M.D.   On: 10/26/2018 13:39    EKG: Independently reviewed.  Sinus tachycardia  Assessment/Plan Active Problems:   Essential hypertension   Pneumonia   Acute respiratory failure with hypoxia (HCC)   Tobacco use     1. Acute respiratory failure with hypoxia.  Secondary to pneumonia.  Patient does become hypoxic into the 80s on  ambulation.  We will try and wean off oxygen as tolerated. 2. Community-acquired pneumonia.  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  Start the patient on Rocephin and azithromycin.  Continue supportive management with nebulizer treatments, mucolytic's, antipyretics.  Check lactic acid.  Check blood cultures.  Check respiratory panel, flu panel as well as urinary antigens. 3. Tobacco use.  Counseled on the importance of tobacco cessation. 4. Hypertension.  Continue on metoprolol since blood pressure stable.  Hold amlodipine and enalapril for now.  DVT prophylaxis: Lovenox Code Status: Full code Family Communication: No family present Disposition Plan: Discharge home once respiratory status has improved Consults called:   Admission status: Inpatient, telemetry  Erick Blinks MD [facility]   If 7PM-7AM, please contact  night-coverage [email or web address]   10/26/2018, 5:16 PM   Addendum:  Patient's influenza panel returned positive.  Will start on Tamiflu.  [employer]

## 2018-10-26 NOTE — ED Provider Notes (Signed)
[facility] EMERGENCY DEPARTMENT Provider Note   CSN: [id number] Arrival date & time: 10/26/18  0801    History   Chief Complaint Chief Complaint  Patient presents with  . Cough    HPI Donna Fitzgerald is a 53 y.o. female.     Patient is a 53 year old female with past medical history of arthritis and hypertension.  She presents today for evaluation of cough for the past several days productive of clear sputum.  She also reports some shortness of breath, chills, and body aches.  She has been taking over-the-counter medications with little relief.  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  The history is provided by the patient.  Cough  Cough characteristics:  Productive Sputum characteristics:  Clear Severity:  Moderate Onset quality:  Gradual Duration:  4 days Timing:  Constant Progression:  Worsening Chronicity:  New Relieved by:  Nothing Ineffective treatments:  Beta-agonist inhaler and decongestant   Past Medical History:  Diagnosis Date  . Arthritis   . Hypertension     Patient Active Problem List   Diagnosis Date Noted  . Insomnia 01/19/2013  . Low back pain 01/19/2013  . Hypertension     Past Surgical History:  Procedure Laterality Date  . BACK SURGERY    . HAND SURGERY Right 1995     OB History    Gravida      Para      Term      Preterm      AB      Living  1     SAB      TAB      Ectopic      Multiple      Live Births               Home Medications    Prior to Admission medications   Medication Sig Start Date End Date Taking? Authorizing Provider  acetaminophen (TYLENOL) 650 MG CR tablet Take 650 mg by mouth every 8 (eight) hours as needed for pain. Takes two    [provider]  albuterol (PROVENTIL HFA;VENTOLIN HFA) 108 (90 Base) MCG/ACT inhaler Inhale 2 puffs into the lungs every 6 (six) hours as needed.    [provider]  amitriptyline (ELAVIL) 100 MG tablet TAKE 1 TABLET  (100 MG TOTAL) BY MOUTH AT BEDTIME. 11/14/13   Allayne Butcher B, PA-C  amLODipine (NORVASC) 10 MG tablet Take 1 tablet (10 mg total) by mouth daily. 01/19/13   Allayne Butcher B, PA-C  amoxicillin-clavulanate (AUGMENTIN) 875-125 MG tablet Take 1 tablet by mouth 2 (two) times daily. One po bid x 7 days.  Start 08/13/2018 08/06/18   Mancel Bale, MD  aspirin 81 MG tablet Take 81 mg by mouth daily.    [provider]  azithromycin (ZITHROMAX) 250 MG tablet Take 1 tablet (250 mg total) by mouth daily. Start 08/13/2018 08/13/18   Mancel Bale, MD  cyclobenzaprine (FLEXERIL) 10 MG tablet TAKE 1 TABLET (10 MG TOTAL) BY MOUTH 3 (THREE) TIMES DAILY AS NEEDED FOR MUSCLE SPASMS. 06/03/13   Allayne Butcher B, PA-C  enalapril (VASOTEC) 20 MG tablet TAKE 1 TABLET BY MOUTH EVERY DAY Patient not taking: Reported on 03/17/2017    Allayne Butcher B, PA-C  gabapentin (NEURONTIN) 300 MG capsule Take 300 mg by mouth 2 (two) times daily.    [provider]  HYDROcodone-acetaminophen (NORCO) 5-325 MG tablet Take 1-2 tablets by mouth every 6 (six) hours as  needed for severe pain. Patient not taking: Reported on 03/17/2017 01/20/17   Doug Sou, MD  lidocaine (LIDODERM) 5 % Place 1 patch onto the skin daily. Remove & Discard patch within 12 hours or as directed by MD    [provider]  meloxicam (MOBIC) 7.5 MG tablet TAKE 1 TABLET BY MOUTH TWICE A DAY WITH FOOD Patient not taking: Reported on 03/17/2017 10/28/13   Dorena Bodo, PA-C  methocarbamol (ROBAXIN) 500 MG tablet Take 500 mg by mouth 2 (two) times daily after a meal.    [provider]  metoprolol (LOPRESSOR) 50 MG tablet TAKE 1 TABLET BY MOUTH TWICE A DAY 11/14/13   Dixon, Mary B, PA-C  naproxen (NAPROSYN) 500 MG tablet TAKE 1 TABLET (500 MG TOTAL) BY MOUTH 2 (TWO) TIMES DAILY WITH A MEAL. 02/12/17   Darreld Mclean, MD  pravastatin (PRAVACHOL) 80 MG tablet Take 80 mg by mouth daily.    [provider]    Family History Family  History  Problem Relation Age of Onset  . Hypertension Mother   . Heart disease Father   . Hypertension Father   . Heart disease Sister   . Hypertension Sister     Social History Social History   Tobacco Use  . Smoking status: Current Every Day Smoker    Packs/day: 0.50    Years: 30.00    Pack years: 15.00    Types: Cigarettes  . Smokeless tobacco: Never Used  Substance Use Topics  . Alcohol use: Yes    Comment: 2 beverages per week  . Drug use: No     Allergies   Patient has no known allergies.   Review of Systems Review of Systems  Respiratory: Positive for cough.   All other systems reviewed and are negative.    Physical Exam Updated Vital Signs BP (!) 130/101 (BP Location: Right Arm)   Pulse (!) 120   Temp 99.7 F (37.6 C) (Oral)   Resp (!) 22   Ht 5\' 10"  (1.778 m)   Wt 86.2 kg   LMP 05/04/2013   SpO2 91%   BMI 27.26 kg/m   Physical Exam Vitals signs and nursing note reviewed.  Constitutional:      General: She is not in acute distress.    Appearance: She is well-developed. She is not diaphoretic.  HENT:     Head: Normocephalic and atraumatic.     Mouth/Throat:     Mouth: Mucous membranes are moist.     Pharynx: Oropharynx is clear. No oropharyngeal exudate.  Neck:     Musculoskeletal: Normal range of motion and neck supple. No neck rigidity or muscular tenderness.  Cardiovascular:     Rate and Rhythm: Normal rate and regular rhythm.     Heart sounds: No murmur. No friction rub. No gallop.   Pulmonary:     Effort: Pulmonary effort is normal. No respiratory distress.     Breath sounds: Normal breath sounds. No wheezing.  Abdominal:     General: Bowel sounds are normal. There is no distension.     Palpations: Abdomen is soft.     Tenderness: There is no abdominal tenderness.  Musculoskeletal: Normal range of motion.  Lymphadenopathy:     Cervical: No cervical adenopathy.  Skin:    General: Skin is warm and dry.  Neurological:     Mental  Status: She is alert and oriented to person, place, and time.      ED Treatments / Results  Labs (all  labs ordered are listed, but only abnormal results are displayed) Labs Reviewed  BASIC METABOLIC PANEL  CBC WITH DIFFERENTIAL/PLATELET  TROPONIN I  BRAIN NATRIURETIC PEPTIDE    EKG None  Radiology No results found.  Procedures Procedures (including critical care time)  Medications Ordered in ED Medications  sodium chloride 0.9 % bolus 1,000 mL (has no administration in time range)  ipratropium-albuterol (DUONEB) 0.5-2.5 (3) MG/3ML nebulizer solution 3 mL (has no administration in time range)     Initial Impression / Assessment and Plan / ED Course  I have reviewed the triage vital signs and the nursing notes.  Pertinent labs & imaging results that were available during my care of the patient were reviewed by me and considered in my medical decision making (see chart for details).  Patient presents here with shortness of breath, fever, and cough.  She is tachycardic upon presentation with no white count but did have fever of 100.5.  Initial chest x-ray shows bronchopneumonia.  Patient is borderline hypoxemic on room air at rest with oxygen saturations in the low 90s.  When she attempts to stand and ambulate, she becomes more dyspneic and saturations drop into the 80s.  A CT scan of the chest was obtained to rule out pulmonary embolism.  This was negative for PE, but did show multifocal pneumonia.  Patient will be given Rocephin and Zithromax and admitted to the hospitalist service due to her hypoxemia and persistent tachycardia.  Dr. Kerry HoughMemon agrees to admit.  Final Clinical Impressions(s) / ED Diagnoses   Final diagnoses:  None    ED Discharge Orders    None       Geoffery Lyonselo, Kayah Hecker, MD 10/26/18 1419

## 2018-10-26 NOTE — ED Triage Notes (Addendum)
PT c/o productive clear sputum cough, some SOB with exertion and after coughing, body aches/chills unrelieved by Ibuprofen & theraflu x3-4 days.

## 2018-10-27 DIAGNOSIS — J101 Influenza due to other identified influenza virus with other respiratory manifestations: Secondary | ICD-10-CM

## 2018-10-27 LAB — COMPREHENSIVE METABOLIC PANEL
ALT: 26 U/L (ref 0–44)
AST: 36 U/L (ref 15–41)
Albumin: 3.6 g/dL (ref 3.5–5.0)
Alkaline Phosphatase: 70 U/L (ref 38–126)
Anion gap: 8 (ref 5–15)
BILIRUBIN TOTAL: 0.4 mg/dL (ref 0.3–1.2)
BUN: 8 mg/dL (ref 6–20)
CALCIUM: 8.2 mg/dL — AB (ref 8.9–10.3)
CO2: 22 mmol/L (ref 22–32)
Chloride: 106 mmol/L (ref 98–111)
Creatinine, Ser: 0.65 mg/dL (ref 0.44–1.00)
GFR calc Af Amer: >60 mL/min (ref >60)
GFR calc non Af Amer: >60 mL/min (ref >60)
Glucose, Bld: 157 mg/dL — ABNORMAL HIGH (ref 70–99)
Potassium: 3.8 mmol/L (ref 3.5–5.1)
Sodium: 136 mmol/L (ref 135–145)
Total Protein: 7.3 g/dL (ref 6.5–8.1)

## 2018-10-27 LAB — RESPIRATORY PANEL BY PCR
Adenovirus: NOT DETECTED
Bordetella pertussis: NOT DETECTED
CORONAVIRUS HKU1-RVPPCR: NOT DETECTED
CORONAVIRUS NL63-RVPPCR: NOT DETECTED
Chlamydophila pneumoniae: NOT DETECTED
Coronavirus 229E: NOT DETECTED
Coronavirus OC43: NOT DETECTED
Influenza A H1 2009: DETECTED — AB
Influenza B: NOT DETECTED
Metapneumovirus: NOT DETECTED
Mycoplasma pneumoniae: NOT DETECTED
PARAINFLUENZA VIRUS 2-RVPPCR: NOT DETECTED
Parainfluenza Virus 1: NOT DETECTED
Parainfluenza Virus 3: NOT DETECTED
Parainfluenza Virus 4: NOT DETECTED
Respiratory Syncytial Virus: NOT DETECTED
Rhinovirus / Enterovirus: NOT DETECTED

## 2018-10-27 LAB — CBC
HCT: 41.1 % (ref 36.0–46.0)
Hemoglobin: 13 g/dL (ref 12.0–15.0)
MCH: 29.6 pg (ref 26.0–34.0)
MCHC: 31.6 g/dL (ref 30.0–36.0)
MCV: 93.6 fL (ref 80.0–100.0)
Platelets: 218 10*3/uL (ref 150–400)
RBC: 4.39 MIL/uL (ref 3.87–5.11)
RDW: 16.2 % — ABNORMAL HIGH (ref 11.5–15.5)
WBC: 9.4 10*3/uL (ref 4.0–10.5)
nRBC: 0 % (ref 0.0–0.2)

## 2018-10-27 LAB — MRSA PCR SCREENING: MRSA by PCR: NEGATIVE

## 2018-10-27 LAB — STREP PNEUMONIAE URINARY ANTIGEN: STREP PNEUMO URINARY ANTIGEN: NEGATIVE

## 2018-10-27 MED ORDER — METHYLPREDNISOLONE SODIUM SUCC 125 MG IJ SOLR
60.0000 mg | Freq: Four times a day (QID) | INTRAMUSCULAR | Status: DC
  Administered 2018-10-27 – 2018-10-29 (×10): 60 mg via INTRAVENOUS
  Filled 2018-10-27 (×10): qty 2

## 2018-10-27 MED ORDER — SODIUM CHLORIDE 0.9 % IV SOLN
2.0000 g | Freq: Two times a day (BID) | INTRAVENOUS | Status: DC
  Administered 2018-10-27 – 2018-10-29 (×5): 2 g via INTRAVENOUS
  Filled 2018-10-27 (×5): qty 2

## 2018-10-27 MED ORDER — IPRATROPIUM-ALBUTEROL 0.5-2.5 (3) MG/3ML IN SOLN
3.0000 mL | RESPIRATORY_TRACT | Status: DC
  Administered 2018-10-27 – 2018-10-29 (×12): 3 mL via RESPIRATORY_TRACT
  Filled 2018-10-27 (×12): qty 3

## 2018-10-27 MED ORDER — IPRATROPIUM-ALBUTEROL 0.5-2.5 (3) MG/3ML IN SOLN
3.0000 mL | Freq: Four times a day (QID) | RESPIRATORY_TRACT | Status: DC | PRN
  Administered 2018-10-27: 3 mL via RESPIRATORY_TRACT
  Filled 2018-10-27: qty 3

## 2018-10-27 MED ORDER — VANCOMYCIN HCL IN DEXTROSE 1-5 GM/200ML-% IV SOLN: 1000.0000 mg | Freq: Two times a day (BID) | INTRAVENOUS | Status: DC

## 2018-10-27 MED ORDER — VANCOMYCIN HCL IN DEXTROSE 1-5 GM/200ML-% IV SOLN
1000.0000 mg | INTRAVENOUS | Status: DC
  Administered 2018-10-27: 1000 mg via INTRAVENOUS
  Filled 2018-10-27: qty 200

## 2018-10-27 MED ORDER — VANCOMYCIN HCL 10 G IV SOLR
2000.0000 mg | Freq: Once | INTRAVENOUS | Status: DC
  Filled 2018-10-27: qty 2000

## 2018-10-27 NOTE — Progress Notes (Signed)
Pharmacy Antibiotic Note  Donna Fitzgerald is a 53 y.o. female admitted on 10/26/2018 with pneumonia.  Pharmacy has been consulted for vancomycin and cefepime dosing. Patient has tested positive for Influenza A by PCR.  Plan: Start cefepime 2g IV q12h Loading dose:  vancomycin 2g IV x1 dose Maintenance dose:  vancomycin 1g  q12h Goal vancomycin  trough range: 15-20   mcg/mL Pharmacy will continue to monitor renal function, vancomycin troughs as clinically indicated, cultures and patient progress.    Height: 5\' 2"  (157.5 cm) Weight: 189 lb 9.5 oz (86 kg) IBW/kg (Calculated) : 50.1  Temp (24hrs), Avg:99.6 F (37.6 C), Min:97 F (36.1 C), Max:102.7 F (39.3 C)  Recent Labs  Lab 10/26/18 1003 10/26/18 1730 10/26/18 2046 10/27/18 0411  WBC 8.4  --   --  9.4  CREATININE 0.70  --   --  0.65  LATICACIDVEN  --  1.3 1.0  --     Estimated Creatinine Clearance: 83.8 mL/min (by C-G formula based on SCr of 0.65 mg/dL).    No Known Allergies  Antimicrobials this admission: ceftriaxone 3/10 >> 3/10 vancomycin 3/11 >>   cefepime 3/11>>   Microbiology results: 3/10 Sputum:   3/11 MRSA PCR:  Thank you for allowing pharmacy to be a part of this patient's care.  Tama High 10/27/2018 8:53 AM

## 2018-10-27 NOTE — Progress Notes (Signed)
At present time -SOB and WOB improved by observation and confirmed by patient.

## 2018-10-27 NOTE — Progress Notes (Signed)
PROGRESS NOTE   Donna Fitzgerald  ZOX:[id number]  DOB: 13-Sep-1965  DOA: 10/26/2018 PCP: [facility]  Brief Admission Hx: 53 y.o. female with medical history significant of hypertension, back pain, chronic tobacco use, presents to the hospital with shortness of breath and cough.  MDM/Assessment & Plan: 1. Acute respiratory failure with hypoxia - Secondary to pneumonia.  Patient does become hypoxic into the 80s on ambulation.  Overnight, added steroids, follow closely.  We will try and wean off oxygen as tolerated. 2. Influenza A - continue tamiflu treatment and supportive care.  3. Multifocal Community-acquired pneumonia - Agree with pulmonology team, changed antibiotics to cefepime/vancomycin.  Continue supportive management with nebulizer treatments, mucolytics, antipyretics.  Check lactic acid.  Check blood cultures.  Check respiratory panel, flu panel as well as urinary antigens. 4. Tobacco use - Counseled on the importance of tobacco cessation. 5. Essential Hypertension - Continue on metoprolol since blood pressure stable.  Hold amlodipine and enalapril for now.  DVT prophylaxis: Lovenox Code Status: Full code Family Communication: No family present Disposition Plan: Discharge home once respiratory status has improved Consults called:   Admission status: Inpatient, telemetry Consultants:  Juanetta Gosling  (pulmonology)   Procedures:    Antimicrobials:  Cefepime 3/11 >  Vancomycin 3/11 >  Subjective: Pt remains SOB with cough.    Objective: Vitals:   10/27/18 0454 10/27/18 0500 10/27/18 0750 10/27/18 0756  BP: (!) 157/94     Pulse: (!) 116 (!) 113    Resp: 20 20    Temp: (!) 97 F (36.1 C)     TempSrc:      SpO2: 92% 97% 97% 95%  Weight:      Height:        Intake/Output Summary (Last 24 hours) at 10/27/2018 0955 Last data filed at 10/27/2018 0500 Gross per 24 hour  Intake 3871.94 ml  Output 800 ml  Net 3071.94 ml   Filed Weights   10/26/18  0812 10/26/18 1754  Weight: 86.2 kg 86 kg   REVIEW OF SYSTEMS  As per history otherwise all reviewed and reported negative  Exam:  General exam: awake, alert, NAD. Cooperative.  Respiratory system: diffuse exp wheezing, rales heard. No increased work of breathing. Cardiovascular system: S1 & S2 heard. Tachycardic.  No JVD, murmurs, gallops, clicks or pedal edema. Gastrointestinal system: Abdomen is nondistended, soft and nontender. Normal bowel sounds heard. Central nervous system: Alert and oriented. No focal neurological deficits. Extremities: no cyanosis or clubbing.  Data Reviewed: Basic Metabolic Panel: Recent Labs  Lab 10/26/18 1003 10/27/18 0411  NA 136 136  K 3.7 3.8  CL 107 106  CO2 22 22  GLUCOSE 106* 157*  BUN 11 8  CREATININE 0.70 0.65  CALCIUM 8.4* 8.2*   Liver Function Tests: Recent Labs  Lab 10/27/18 0411  AST 36  ALT 26  ALKPHOS 70  BILITOT 0.4  PROT 7.3  ALBUMIN 3.6   No results for input(s): LIPASE, AMYLASE in the last 168 hours. No results for input(s): AMMONIA in the last 168 hours. CBC: Recent Labs  Lab 10/26/18 1003 10/27/18 0411  WBC 8.4 9.4  NEUTROABS 6.6  --   HGB 11.8* 13.0  HCT 36.8 41.1  MCV 94.4 93.6  PLT 231 218   Cardiac Enzymes: Recent Labs  Lab 10/26/18 1003  TROPONINI <0.03   CBG (last 3)  No results for input(s): GLUCAP in the last 72 hours. Recent Results (from the past 240 hour(s))  Respiratory Panel by PCR  Status: Abnormal   Collection Time: 10/26/18  5:47 PM  Result Value Ref Range Status   Adenovirus NOT DETECTED NOT DETECTED Final   Coronavirus 229E NOT DETECTED NOT DETECTED Final    Comment: (NOTE) The Coronavirus on the Respiratory Panel, DOES NOT test for the novel  Coronavirus (2019 nCoV)    Coronavirus HKU1 NOT DETECTED NOT DETECTED Final   Coronavirus NL63 NOT DETECTED NOT DETECTED Final   Coronavirus OC43 NOT DETECTED NOT DETECTED Final   Metapneumovirus NOT DETECTED NOT DETECTED Final    Rhinovirus / Enterovirus NOT DETECTED NOT DETECTED Final   Influenza A H1 2009 DETECTED (A) NOT DETECTED Final   Influenza B NOT DETECTED NOT DETECTED Final   Parainfluenza Virus 1 NOT DETECTED NOT DETECTED Final   Parainfluenza Virus 2 NOT DETECTED NOT DETECTED Final   Parainfluenza Virus 3 NOT DETECTED NOT DETECTED Final   Parainfluenza Virus 4 NOT DETECTED NOT DETECTED Final   Respiratory Syncytial Virus NOT DETECTED NOT DETECTED Final   Bordetella pertussis NOT DETECTED NOT DETECTED Final   Chlamydophila pneumoniae NOT DETECTED NOT DETECTED Final   Mycoplasma pneumoniae NOT DETECTED NOT DETECTED Final    Comment: Performed at [facility], [address], [place], [place] [address]  Culture, blood (routine x 2) Call MD if unable to obtain prior to antibiotics being given     Status: None (Preliminary result)   Collection Time: 10/26/18  6:06 PM  Result Value Ref Range Status   Specimen Description LEFT ANTECUBITAL  Final   Special Requests   Final    BOTTLES DRAWN AEROBIC ONLY Blood Culture adequate volume   Culture   Final    NO GROWTH < 12 HOURS Performed at [facility], [address], [place], [place] [address]    Report Status PENDING  Incomplete  Culture, blood (routine x 2) Call MD if unable to obtain prior to antibiotics being given     Status: None (Preliminary result)   Collection Time: 10/26/18  6:06 PM  Result Value Ref Range Status   Specimen Description BLOOD LEFT HAND  Final   Special Requests   Final    BOTTLES DRAWN AEROBIC AND ANAEROBIC Blood Culture adequate volume   Culture   Final    NO GROWTH < 12 HOURS Performed at [facility], [address] [place], [place] [address]    Report Status PENDING  Incomplete     Studies: Dg Chest 2 View  Result Date: 10/26/2018 CLINICAL DATA:  53 year old female with a history of cough and shortness of breath EXAM: CHEST - 2 VIEW COMPARISON:  08/12/2018, 01/20/2017 FINDINGS: Reticulonodular opacities throughout  the lungs without confluent airspace disease. No evidence of pleural effusion or pneumothorax. Cardiomediastinal silhouette within normal limits. Degenerative changes of the spine IMPRESSION: Reticulonodular pattern of opacity suggests bronchopneumonia. Electronically Signed   By: Gilmer Mor D.O.   On: 10/26/2018 10:04   Ct Angio Chest Pe W And/or Wo Contrast  Result Date: 10/26/2018 CLINICAL DATA:  Shortness of Breath EXAM: CT ANGIOGRAPHY CHEST WITH CONTRAST TECHNIQUE: Multidetector CT imaging of the chest was performed using the standard protocol during bolus administration of intravenous contrast. Multiplanar CT image reconstructions and MIPs were obtained to evaluate the vascular anatomy. CONTRAST:  OMNIPAQUE IOHEXOL 350 MG/ML SOLN COMPARISON:  Chest radiograph October 26, 2018 FINDINGS: Cardiovascular: There is no demonstrable pulmonary embolus. There is no thoracic aortic aneurysm or dissection. There is mild calcification in the proximal left common carotid artery. Visualized great  vessels otherwise appear unremarkable. There are occasional foci of coronary artery calcification. There is no pericardial effusion or pericardial thickening. Mediastinum/Nodes: Visualized thyroid appears normal. There are several subcentimeter mediastinal lymph nodes. There is a pretracheal lymph node measuring 1.6 x 1.4 cm. There is a subcarinal lymph node measuring 1.8 x 1.6 cm. A second subcarinal lymph node measures 1.4 x 1.1 cm. There is a superior left hilar lymph node measuring 1.5 x 1.4 cm. There is a right hilar lymph node measuring 1.0 x 1.0 cm. There is a lymph node anterior to the carina measuring 1.2 x 1.0 cm. There is a small hiatal hernia. Lungs/Pleura: There is a focal area of airspace consolidation in the posterior segment of the left lower lobe. There is lower lobe atelectatic change as well. There is slight tree on but appearance in the posterior segment right upper lobe, felt to represent pneumonia.  Similar appearance is also noted in the inferior lingula. There is a mild degree of lower lobe bronchiectatic change bilaterally. There are somewhat ill-defined nodular opacities in the right upper lobe measuring between 4 and 5 mm, best seen on axial slices 33, 34, and 35 series 6. Upper Abdomen: There is hepatic steatosis. Visualized upper abdominal structures otherwise appear unremarkable. Musculoskeletal: There are no blastic or lytic bone lesions. No evident chest wall lesions. Review of the MIP images confirms the above findings. IMPRESSION: 1. No demonstrable pulmonary embolus. No thoracic aortic aneurysm or dissection. There are foci of coronary artery calcification. 2. Multifocal pneumonia as noted. Several 4-5 mm nodular opacities are noted in the right upper lobe which potentially could be related to pneumonia. No follow-up needed if patient is low-risk (and has no known or suspected primary neoplasm). Non-contrast chest CT can be considered in 12 months if patient is high-risk. This recommendation follows the consensus statement: Guidelines for Management of Incidental Pulmonary Nodules Detected on CT Images: From the Fleischner Society 2017; Radiology 2017; 284:228-243. There is mild lower lobe bronchiectatic change. 3. Multifocal adenopathy of uncertain etiology. Adenopathy potentially could be secondary to pneumonia. 4.  Small hiatal hernia. 5.  Hepatic steatosis. Electronically Signed   By: Bretta Bang III M.D.   On: 10/26/2018 13:39   Scheduled Meds:  amitriptyline  100 mg Oral QHS   aspirin  81 mg Oral Daily   budesonide (PULMICORT) nebulizer solution  0.25 mg Nebulization BID   enoxaparin (LOVENOX) injection  40 mg Subcutaneous Q24H   gabapentin  300 mg Oral BID   guaiFENesin  1,200 mg Oral BID   ipratropium-albuterol  3 mL Nebulization Q4H   methocarbamol  500 mg Oral BID PC   methylPREDNISolone (SOLU-MEDROL) injection  60 mg Intravenous Q6H   metoprolol tartrate  50  mg Oral BID   oseltamivir  75 mg Oral BID   pravastatin  80 mg Oral q1800   Continuous Infusions:  0.9 % NaCl with KCl 20 mEq / L 10 mL/hr at 10/27/18 0500   ceFEPime (MAXIPIME) IV     vancomycin 1,000 mg (10/27/18 2919)    Active Problems:   Essential hypertension   Pneumonia   Acute respiratory failure with hypoxia (HCC)   Tobacco use   Influenza A   Time spent:   Standley Dakins, MD [facility] 10/27/2018, 9:55 AM    LOS: 1 day  How to contact the [facility] Attending or Consulting provider 7A - 7P or covering provider during after hours 7P -7A, for this patient?  1. Check the care team in [facility]  and look for a) attending/consulting [place] provider listed and b) the [facility] team listed 2. Log into [email or web address] and use [place]'s universal password to access. If you do not have the password, please contact the hospital operator. 3. Locate the [facility] provider you are looking for under [facility] and page to a number that you can be directly reached. 4. If you still have difficulty reaching the provider, please page the [facility] (Director on Call) for the Hospitalists listed on amion for assistance.

## 2018-10-27 NOTE — Progress Notes (Addendum)
Called by RN to evaluate patient who is now requiring 5 L/min of oxygen, earlier admitted with community-acquired pneumonia.  Initially she was started on 2 L/min of oxygen. Patient says her breathing is little better after increasing oxygen. On exam Bilateral rhonchi auscultated  Assessment Community-acquired pneumonia  Plan  Start Solu-Medrol 60 mg IV every 6 hours  Decrease IV normal saline to KVO, since patient blood pressure is stable.  She has history of hypertension.  At risk of developing  fluid overload.  She is already +2 L since admit.  Continue ceftriaxone, Zithromax.  Continue DuoNeb every 6 hours.  Continuous pulse ox  Critical care time spent 35 minutes.

## 2018-10-27 NOTE — Progress Notes (Signed)
At 0000: Pt with increase work of breathing and  C/O  SOB  with  sats 87% on 2L via N/C, increased 02 to 4L via N/C with sats at 91%. RT called for breathing treatment. Completed at 0025. Patient currently on 5L via N/C with sats at 94%. States SOB "better". Work of breathing not changed per nursing assessment. Dr. Sharl Ma called with patient resp status by Wallace Cullens , RN. Dr. Sharl Ma at bedside [phone]) and assessed pt  with orders obtained for IV fluids at [facility] and continuous pulse ox. Continue to monitor.

## 2018-10-27 NOTE — Consult Note (Signed)
Consult requested by: [facility] Consult requested for: Multifocal pneumonia  HPI: This is a 53 year old who said she started having symptoms of cough and congestion about 4 5 days ago.  She thought she had basically a respiratory infection and was taking over-the-counter medications trying to increase her fluid intake when she became much sicker and eventually presented to the emergency department.  At baseline she has hypertension chronic back pain and she does smoke about a package of cigarettes daily.  She is unaware of any personal or family history of pulmonary disease.  She says that she had an episode of pneumonia several years ago.  When she was seen in the emergency room she was febrile tachycardic and hypoxic.  She was started on oxygen given IV fluids started on antibiotics.  Chest x-ray was not diagnostic so she had CT which shows multifocal pneumonia.  It may also show some areas of respiratory bronchiolitis from her cigarette smoking.  I have personally reviewed the chest x-ray and the CT.  She was started on intravenous antibiotics given IV fluids and initially felt somewhat better than had more trouble last night.  She feels better now.  She is on steroids antibiotics inhaled bronchodilators.  She is coughing up a little bit of sputum.  She is positive for influenza A.  She works at a nursing facility.  She denies any chest pain.  She has not had hemoptysis.  No nausea or vomiting.  No abdominal pain.  No urinary symptoms.  She has had a headache.   Past Medical History:  Diagnosis Date  . Arthritis   . Hypertension      Family History  Problem Relation Age of Onset  . Hypertension Mother   . Heart disease Father   . Hypertension Father   . Heart disease Sister   . Hypertension Sister      Social History   Socioeconomic History  . Marital status: Divorced    Spouse name: Not on file  . Number of children: Not on file  . Years of education: Not on file  . Highest  education level: Not on file  Occupational History  . Not on file  Social Needs  . Financial resource strain: Patient refused  . Food insecurity:    Worry: Patient refused    Inability: Patient refused  . Transportation needs:    Medical: Patient refused    Non-medical: Patient refused  Tobacco Use  . Smoking status: Current Every Day Smoker    Packs/day: 0.50    Years: 30.00    Pack years: 15.00    Types: Cigarettes  . Smokeless tobacco: Never Used  Substance and Sexual Activity  . Alcohol use: Yes    Comment: 2 beverages per week  . Drug use: No  . Sexual activity: Not Currently  Lifestyle  . Physical activity:    Days per week: Patient refused    Minutes per session: Patient refused  . Stress: Patient refused  Relationships  . Social connections:    Talks on phone: Patient refused    Gets together: Patient refused    Attends religious service: Patient refused    Active member of club or organization: Patient refused    Attends meetings of clubs or organizations: Patient refused    Relationship status: Patient refused  Other Topics Concern  . Not on file  Social History Narrative  . Not on file     ROS: Except as mentioned 10 point review of systems is  negative    Objective: Vital signs in last 24 hours: Temp:  [97 F (36.1 C)-102.7 F (39.3 C)] 97 F (36.1 C) (03/11 0454) Pulse Rate:  [109-129] 113 (03/11 0500) Resp:  [20-47] 20 (03/11 0500) BP: (111-164)/(62-94) 157/94 (03/11 0454) SpO2:  [86 %-100 %] 95 % (03/11 0756) Weight:  [86 kg] 86 kg (03/10 1754) Weight change:     Intake/Output from previous day: 03/10 0701 - 03/11 0700 In: 3871.9 [P.O.:840; I.V.:779.7; IV Piggyback:2252.2] Out: 800 [Urine:800]  PHYSICAL EXAM Constitutional: She is moderately obese.  She does not look to be in any acute distress.  Eyes: Pupils react EOMI.  Ears nose mouth and throat: Her mucous membranes are moist.  Hearing is grossly normal.  Cardiovascular: Her heart is  regular with normal heart sounds.  Respiratory: She has somewhat increased respiratory effort and has some wheezing after she coughs.  She has diffuse bilateral rales.  Gastrointestinal: Her abdomen is soft with no masses.  Skin: Warm and dry.  Musculoskeletal: Grossly normal strength in the upper and lower extremities bilaterally.  Neurological: No focal abnormalities.  Psychiatric: Normal mood and affect  Lab Results: Basic Metabolic Panel: Recent Labs    10/26/18 1003 10/27/18 0411  NA 136 136  K 3.7 3.8  CL 107 106  CO2 22 22  GLUCOSE 106* 157*  BUN 11 8  CREATININE 0.70 0.65  CALCIUM 8.4* 8.2*   Liver Function Tests: Recent Labs    10/27/18 0411  AST 36  ALT 26  ALKPHOS 70  BILITOT 0.4  PROT 7.3  ALBUMIN 3.6   No results for input(s): LIPASE, AMYLASE in the last 72 hours. No results for input(s): AMMONIA in the last 72 hours. CBC: Recent Labs    10/26/18 1003 10/27/18 0411  WBC 8.4 9.4  NEUTROABS 6.6  --   HGB 11.8* 13.0  HCT 36.8 41.1  MCV 94.4 93.6  PLT 231 218   Cardiac Enzymes: Recent Labs    10/26/18 1003  TROPONINI <0.03   BNP: No results for input(s): PROBNP in the last 72 hours. D-Dimer: No results for input(s): DDIMER in the last 72 hours. CBG: No results for input(s): GLUCAP in the last 72 hours. Hemoglobin A1C: No results for input(s): HGBA1C in the last 72 hours. Fasting Lipid Panel: No results for input(s): CHOL, HDL, LDLCALC, TRIG, CHOLHDL, LDLDIRECT in the last 72 hours. Thyroid Function Tests: No results for input(s): TSH, T4TOTAL, FREET4, T3FREE, THYROIDAB in the last 72 hours. Anemia Panel: No results for input(s): VITAMINB12, FOLATE, FERRITIN, TIBC, IRON, RETICCTPCT in the last 72 hours. Coagulation: No results for input(s): LABPROT, INR in the last 72 hours. Urine Drug Screen: Drugs of Abuse  No results found for: LABOPIA, COCAINSCRNUR, LABBENZ, AMPHETMU, THCU, LABBARB  Alcohol Level: No results for input(s): ETH in the last  72 hours. Urinalysis: No results for input(s): COLORURINE, LABSPEC, PHURINE, GLUCOSEU, HGBUR, BILIRUBINUR, KETONESUR, PROTEINUR, UROBILINOGEN, NITRITE, LEUKOCYTESUR in the last 72 hours.  Invalid input(s): APPERANCEUR Misc. Labs:   ABGS: No results for input(s): PHART, PO2ART, TCO2, HCO3 in the last 72 hours.  Invalid input(s): PCO2   MICROBIOLOGY: Recent Results (from the past 240 hour(s))  Respiratory Panel by PCR     Status: Abnormal   Collection Time: 10/26/18  5:47 PM  Result Value Ref Range Status   Adenovirus NOT DETECTED NOT DETECTED Final   Coronavirus 229E NOT DETECTED NOT DETECTED Final    Comment: (NOTE) The Coronavirus on the Respiratory Panel, DOES NOT test for  the novel  Coronavirus (2019 nCoV)    Coronavirus HKU1 NOT DETECTED NOT DETECTED Final   Coronavirus NL63 NOT DETECTED NOT DETECTED Final   Coronavirus OC43 NOT DETECTED NOT DETECTED Final   Metapneumovirus NOT DETECTED NOT DETECTED Final   Rhinovirus / Enterovirus NOT DETECTED NOT DETECTED Final   Influenza A H1 2009 DETECTED (A) NOT DETECTED Final   Influenza B NOT DETECTED NOT DETECTED Final   Parainfluenza Virus 1 NOT DETECTED NOT DETECTED Final   Parainfluenza Virus 2 NOT DETECTED NOT DETECTED Final   Parainfluenza Virus 3 NOT DETECTED NOT DETECTED Final   Parainfluenza Virus 4 NOT DETECTED NOT DETECTED Final   Respiratory Syncytial Virus NOT DETECTED NOT DETECTED Final   Bordetella pertussis NOT DETECTED NOT DETECTED Final   Chlamydophila pneumoniae NOT DETECTED NOT DETECTED Final   Mycoplasma pneumoniae NOT DETECTED NOT DETECTED Final    Comment: Performed at [facility], [address] [place], [place] [address]  Culture, blood (routine x 2) Call MD if unable to obtain prior to antibiotics being given     Status: None (Preliminary result)   Collection Time: 10/26/18  6:06 PM  Result Value Ref Range Status   Specimen Description LEFT ANTECUBITAL  Final   Special Requests   Final     BOTTLES DRAWN AEROBIC ONLY Blood Culture adequate volume   Culture   Final    NO GROWTH < 12 HOURS Performed at [facility], [address] [place], [place] [address]    Report Status PENDING  Incomplete  Culture, blood (routine x 2) Call MD if unable to obtain prior to antibiotics being given     Status: None (Preliminary result)   Collection Time: 10/26/18  6:06 PM  Result Value Ref Range Status   Specimen Description BLOOD LEFT HAND  Final   Special Requests   Final    BOTTLES DRAWN AEROBIC AND ANAEROBIC Blood Culture adequate volume   Culture   Final    NO GROWTH < 12 HOURS Performed at [facility], [address] [place], [place] [address]    Report Status PENDING  Incomplete    Studies/Results: Dg Chest 2 View  Result Date: 10/26/2018 CLINICAL DATA:  53 year old female with a history of cough and shortness of breath EXAM: CHEST - 2 VIEW COMPARISON:  08/12/2018, 01/20/2017 FINDINGS: Reticulonodular opacities throughout the lungs without confluent airspace disease. No evidence of pleural effusion or pneumothorax. Cardiomediastinal silhouette within normal limits. Degenerative changes of the spine IMPRESSION: Reticulonodular pattern of opacity suggests bronchopneumonia. Electronically Signed   By: Gilmer MorJaime  Wagner D.O.   On: 10/26/2018 10:04   Ct Angio Chest Pe W And/or Wo Contrast  Result Date: 10/26/2018 CLINICAL DATA:  Shortness of Breath EXAM: CT ANGIOGRAPHY CHEST WITH CONTRAST TECHNIQUE: Multidetector CT imaging of the chest was performed using the standard protocol during bolus administration of intravenous contrast. Multiplanar CT image reconstructions and MIPs were obtained to evaluate the vascular anatomy. CONTRAST:  100mL OMNIPAQUE IOHEXOL 350 MG/ML SOLN COMPARISON:  Chest radiograph October 26, 2018 FINDINGS: Cardiovascular: There is no demonstrable pulmonary embolus. There is no thoracic aortic aneurysm or dissection. There is mild calcification in the proximal left common  carotid artery. Visualized great vessels otherwise appear unremarkable. There are occasional foci of coronary artery calcification. There is no pericardial effusion or pericardial thickening. Mediastinum/Nodes: Visualized thyroid appears normal. There are several subcentimeter mediastinal lymph nodes. There is a pretracheal lymph node measuring 1.6 x 1.4 cm. There is a subcarinal lymph node  measuring 1.8 x 1.6 cm. A second subcarinal lymph node measures 1.4 x 1.1 cm. There is a superior left hilar lymph node measuring 1.5 x 1.4 cm. There is a right hilar lymph node measuring 1.0 x 1.0 cm. There is a lymph node anterior to the carina measuring 1.2 x 1.0 cm. There is a small hiatal hernia. Lungs/Pleura: There is a focal area of airspace consolidation in the posterior segment of the left lower lobe. There is lower lobe atelectatic change as well. There is slight tree on but appearance in the posterior segment right upper lobe, felt to represent pneumonia. Similar appearance is also noted in the inferior lingula. There is a mild degree of lower lobe bronchiectatic change bilaterally. There are somewhat ill-defined nodular opacities in the right upper lobe measuring between 4 and 5 mm, best seen on axial slices 33, 34, and 35 series 6. Upper Abdomen: There is hepatic steatosis. Visualized upper abdominal structures otherwise appear unremarkable. Musculoskeletal: There are no blastic or lytic bone lesions. No evident chest wall lesions. Review of the MIP images confirms the above findings. IMPRESSION: 1. No demonstrable pulmonary embolus. No thoracic aortic aneurysm or dissection. There are foci of coronary artery calcification. 2. Multifocal pneumonia as noted. Several 4-5 mm nodular opacities are noted in the right upper lobe which potentially could be related to pneumonia. No follow-up needed if patient is low-risk (and has no known or suspected primary neoplasm). Non-contrast chest CT can be considered in 12 months if  patient is high-risk. This recommendation follows the consensus statement: Guidelines for Management of Incidental Pulmonary Nodules Detected on CT Images: From the Fleischner Society 2017; Radiology 2017; 284:228-243. There is mild lower lobe bronchiectatic change. 3. Multifocal adenopathy of uncertain etiology. Adenopathy potentially could be secondary to pneumonia. 4.  Small hiatal hernia. 5.  Hepatic steatosis. Electronically Signed   By: Bretta Bang III M.D.   On: 10/26/2018 13:39    Medications:  Prior to Admission:  Medications Prior to Admission  Medication Sig Dispense Refill Last Dose  . acetaminophen (TYLENOL) 650 MG CR tablet Take 650 mg by mouth every 8 (eight) hours as needed for pain. Takes two   Taking  . albuterol (PROVENTIL HFA;VENTOLIN HFA) 108 (90 Base) MCG/ACT inhaler Inhale 2 puffs into the lungs every 6 (six) hours as needed.   10/25/2018 at Unknown time  . amitriptyline (ELAVIL) 100 MG tablet TAKE 1 TABLET (100 MG TOTAL) BY MOUTH AT BEDTIME. 30 tablet 0 Past Week at Unknown time  . amLODipine (NORVASC) 10 MG tablet Take 1 tablet (10 mg total) by mouth daily. 90 tablet 3 10/25/2018 at Unknown time  . aspirin 81 MG tablet Take 81 mg by mouth daily.   10/25/2018 at 700  . enalapril (VASOTEC) 20 MG tablet TAKE 1 TABLET BY MOUTH EVERY DAY 90 tablet 0 10/25/2018 at Unknown time  . meloxicam (MOBIC) 7.5 MG tablet TAKE 1 TABLET BY MOUTH TWICE A DAY WITH FOOD 60 tablet 0 10/25/2018 at Unknown time  . methocarbamol (ROBAXIN) 500 MG tablet Take 500 mg by mouth 2 (two) times daily after a meal.   10/25/2018 at Unknown time  . metoprolol (LOPRESSOR) 50 MG tablet TAKE 1 TABLET BY MOUTH TWICE A DAY 60 tablet 0 10/25/2018 at 700  . naproxen (NAPROSYN) 500 MG tablet TAKE 1 TABLET (500 MG TOTAL) BY MOUTH 2 (TWO) TIMES DAILY WITH A MEAL. 60 tablet 5 10/25/2018 at Unknown time  . pravastatin (PRAVACHOL) 80 MG tablet Take 80 mg by  mouth daily.   10/25/2018 at Unknown time  . cyclobenzaprine (FLEXERIL) 10 MG  tablet TAKE 1 TABLET (10 MG TOTAL) BY MOUTH 3 (THREE) TIMES DAILY AS NEEDED FOR MUSCLE SPASMS. (Patient not taking: Reported on 10/26/2018) 60 tablet 2 Completed Course at Unknown time  . gabapentin (NEURONTIN) 300 MG capsule Take 300 mg by mouth 2 (two) times daily.   Not Taking at Unknown time  . HYDROcodone-acetaminophen (NORCO) 5-325 MG tablet Take 1-2 tablets by mouth every 6 (six) hours as needed for severe pain. (Patient not taking: Reported on 03/17/2017) 12 tablet 0 Completed Course at Unknown time   Scheduled: . amitriptyline  100 mg Oral QHS  . aspirin  81 mg Oral Daily  . budesonide (PULMICORT) nebulizer solution  0.25 mg Nebulization BID  . enoxaparin (LOVENOX) injection  40 mg Subcutaneous Q24H  . gabapentin  300 mg Oral BID  . guaiFENesin  1,200 mg Oral BID  . ipratropium-albuterol  3 mL Nebulization Q4H  . methocarbamol  500 mg Oral BID PC  . methylPREDNISolone (SOLU-MEDROL) injection  60 mg Intravenous Q6H  . metoprolol tartrate  50 mg Oral BID  . oseltamivir  75 mg Oral BID  . pravastatin  80 mg Oral q1800   Continuous: . 0.9 % NaCl with KCl 20 mEq / L 10 mL/hr at 10/27/18 0500  . azithromycin 500 mg (10/26/18 1610)  . cefTRIAXone (ROCEPHIN)  IV     ZOX:[id number], ipratropium-albuterol, ketorolac, ondansetron (ZOFRAN) IV  Assesment: She has multifocal pneumonia that is associated with influenza A.  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  In the past pneumonia associated with influenza A has a high proportion of Staphylococcus infections so I think we should broaden her antibiotics.  I think she probably has some element of COPD with her tobacco use and agree with the use of steroids plus the inhaled bronchodilators Active Problems:   Essential hypertension   Pneumonia   Acute respiratory failure with hypoxia (HCC)   Tobacco use   Influenza A    Plan: I would switch to cefepime and vancomycin for now.  Continue all the other treatments.  Add  flutter valve.  Based on the nature of findings on her CT and the multifocal pneumonia I think she will have a slow progress  Thanks for allowing me to see her with you    LOS: 1 day   Fredirick Maudlin 10/27/2018, 8:27 AM

## 2018-10-27 NOTE — Progress Notes (Signed)
At present time- patient with decreased SOB (as stated by patient) and decreased work of breathing. Resp 26, even and deep. 02 at 5L via N/C with  sats at 92% via telemonitor. Lungs sounds remain congested with expiratory wheezing. Continue to monitor.Color pink per ethnicity.

## 2018-10-28 ENCOUNTER — Inpatient Hospital Stay (HOSPITAL_COMMUNITY): Payer: Commercial Managed Care - PPO

## 2018-10-28 LAB — HIV ANTIBODY (ROUTINE TESTING W REFLEX): HIV Screen 4th Generation wRfx: NONREACTIVE

## 2018-10-28 NOTE — Progress Notes (Signed)
PROGRESS NOTE   Donna Fitzgerald  SHU:[id number]  DOB: Jul 11, 1966  DOA: 10/26/2018 PCP: [facility]  Brief Admission Hx: 53 y.o. female with medical history significant of hypertension, back pain, chronic tobacco use, presents to the hospital with shortness of breath and cough.  MDM/Assessment & Plan: 1. Acute respiratory failure with hypoxia - Secondary to pneumonia.  Patient became hypoxic down to 80s with ambulation.    Continue steroids and other treatments.  Appreciate assistance of Dr. Juanetta Gosling with pulmonology. 2. Influenza A - continue tamiflu treatment and supportive care.  3. Multifocal Community-acquired pneumonia -continue cefepime/vancomycin.  Continue supportive management with nebulizer treatments, mucolytics, antipyretics.  Check lactic acid.  Check blood cultures.  Check respiratory panel, flu panel as well as urinary antigens. 4. Tobacco use - Counseled on the importance of tobacco cessation. 5. Essential Hypertension - Continue on metoprolol since blood pressure stable.    Temporarily holding amlodipine and enalapril for now.  DVT prophylaxis: Lovenox Code Status: Full code Family Communication: No family present Disposition Plan: Discharge home once respiratory status has improved Consults called:   Admission status: Inpatient, telemetry Consultants:  Juanetta Gosling  (pulmonology)   Procedures:    Antimicrobials:  Cefepime 3/11 >  Vancomycin 3/11 >  Subjective: Pt reports that she is feeling better with treatments.  She is ambulating in the room.  She denies shortness of breath.  She does have chest pain with coughing.  Objective: Vitals:   10/28/18 0057 10/28/18 0433 10/28/18 0642 10/28/18 0744  BP:   127/71   Pulse:   95   Resp:   20   Temp:   98.4 F (36.9 C)   TempSrc:   Oral   SpO2: 93% 98% 100% 99%  Weight:      Height:        Intake/Output Summary (Last 24 hours) at 10/28/2018 1234 Last data filed at 10/28/2018 0900 Gross  per 24 hour  Intake 959.63 ml  Output --  Net 959.63 ml   Filed Weights   10/26/18 0812 10/26/18 1754  Weight: 86.2 kg 86 kg   REVIEW OF SYSTEMS  As per history otherwise all reviewed and reported negative  Exam:  General exam: awake, alert, NAD. Cooperative.  Respiratory system: diffuse exp wheezing, rales heard. No increased work of breathing. Cardiovascular system: S1 & S2 heard. Tachycardic.  No JVD, murmurs, gallops, clicks or pedal edema. Gastrointestinal system: Abdomen is nondistended, soft and nontender. Normal bowel sounds heard. Central nervous system: Alert and oriented. No focal neurological deficits. Extremities: no cyanosis or clubbing.  Data Reviewed: Basic Metabolic Panel: Recent Labs  Lab 10/26/18 1003 10/27/18 0411  NA 136 136  K 3.7 3.8  CL 107 106  CO2 22 22  GLUCOSE 106* 157*  BUN 11 8  CREATININE 0.70 0.65  CALCIUM 8.4* 8.2*   Liver Function Tests: Recent Labs  Lab 10/27/18 0411  AST 36  ALT 26  ALKPHOS 70  BILITOT 0.4  PROT 7.3  ALBUMIN 3.6   No results for input(s): LIPASE, AMYLASE in the last 168 hours. No results for input(s): AMMONIA in the last 168 hours. CBC: Recent Labs  Lab 10/26/18 1003 10/27/18 0411  WBC 8.4 9.4  NEUTROABS 6.6  --   HGB 11.8* 13.0  HCT 36.8 41.1  MCV 94.4 93.6  PLT 231 218   Cardiac Enzymes: Recent Labs  Lab 10/26/18 1003  TROPONINI <0.03   CBG (last 3)  No results for input(s): GLUCAP in the last 72  hours. Recent Results (from the past 240 hour(s))  Respiratory Panel by PCR     Status: Abnormal   Collection Time: 10/26/18  5:47 PM  Result Value Ref Range Status   Adenovirus NOT DETECTED NOT DETECTED Final   Coronavirus 229E NOT DETECTED NOT DETECTED Final    Comment: (NOTE) The Coronavirus on the Respiratory Panel, DOES NOT test for the novel  Coronavirus (2019 nCoV)    Coronavirus HKU1 NOT DETECTED NOT DETECTED Final   Coronavirus NL63 NOT DETECTED NOT DETECTED Final   Coronavirus  OC43 NOT DETECTED NOT DETECTED Final   Metapneumovirus NOT DETECTED NOT DETECTED Final   Rhinovirus / Enterovirus NOT DETECTED NOT DETECTED Final   Influenza A H1 2009 DETECTED (A) NOT DETECTED Final   Influenza B NOT DETECTED NOT DETECTED Final   Parainfluenza Virus 1 NOT DETECTED NOT DETECTED Final   Parainfluenza Virus 2 NOT DETECTED NOT DETECTED Final   Parainfluenza Virus 3 NOT DETECTED NOT DETECTED Final   Parainfluenza Virus 4 NOT DETECTED NOT DETECTED Final   Respiratory Syncytial Virus NOT DETECTED NOT DETECTED Final   Bordetella pertussis NOT DETECTED NOT DETECTED Final   Chlamydophila pneumoniae NOT DETECTED NOT DETECTED Final   Mycoplasma pneumoniae NOT DETECTED NOT DETECTED Final    Comment: Performed at [facility], [address] [place], [place] [address]  Culture, blood (routine x 2) Call MD if unable to obtain prior to antibiotics being given     Status: None (Preliminary result)   Collection Time: 10/26/18  6:06 PM  Result Value Ref Range Status   Specimen Description LEFT ANTECUBITAL  Final   Special Requests   Final    BOTTLES DRAWN AEROBIC ONLY Blood Culture adequate volume   Culture   Final    NO GROWTH 2 DAYS Performed at [facility], [address], [place], [place] [address]    Report Status PENDING  Incomplete  Culture, blood (routine x 2) Call MD if unable to obtain prior to antibiotics being given     Status: None (Preliminary result)   Collection Time: 10/26/18  6:06 PM  Result Value Ref Range Status   Specimen Description BLOOD LEFT HAND  Final   Special Requests   Final    BOTTLES DRAWN AEROBIC AND ANAEROBIC Blood Culture adequate volume   Culture   Final    NO GROWTH 2 DAYS Performed at [facility], [address], [place], [place] [address]    Report Status PENDING  Incomplete  MRSA PCR Screening     Status: None   Collection Time: 10/27/18  8:51 AM  Result Value Ref Range Status   MRSA by PCR NEGATIVE NEGATIVE Final    Comment:          The GeneXpert MRSA Assay (FDA approved for NASAL specimens only), is one component of a comprehensive MRSA colonization surveillance program. It is not intended to diagnose MRSA infection nor to guide or monitor treatment for MRSA infections. Performed at [facility], [address] [place], [place] [address]      Studies: Ct Angio Chest Pe W And/or Wo Contrast  Result Date: 10/26/2018 CLINICAL DATA:  Shortness of Breath EXAM: CT ANGIOGRAPHY CHEST WITH CONTRAST TECHNIQUE: Multidetector CT imaging of the chest was performed using the standard protocol during bolus administration of intravenous contrast. Multiplanar CT image reconstructions and MIPs were obtained to evaluate the vascular anatomy. CONTRAST:  OMNIPAQUE IOHEXOL 350 MG/ML SOLN COMPARISON:  Chest radiograph October 26, 2018 FINDINGS: Cardiovascular:  There is no demonstrable pulmonary embolus. There is no thoracic aortic aneurysm or dissection. There is mild calcification in the proximal left common carotid artery. Visualized great vessels otherwise appear unremarkable. There are occasional foci of coronary artery calcification. There is no pericardial effusion or pericardial thickening. Mediastinum/Nodes: Visualized thyroid appears normal. There are several subcentimeter mediastinal lymph nodes. There is a pretracheal lymph node measuring 1.6 x 1.4 cm. There is a subcarinal lymph node measuring 1.8 x 1.6 cm. A second subcarinal lymph node measures 1.4 x 1.1 cm. There is a superior left hilar lymph node measuring 1.5 x 1.4 cm. There is a right hilar lymph node measuring 1.0 x 1.0 cm. There is a lymph node anterior to the carina measuring 1.2 x 1.0 cm. There is a small hiatal hernia. Lungs/Pleura: There is a focal area of airspace consolidation in the posterior segment of the left lower lobe. There is lower lobe atelectatic change as well. There is slight tree on but appearance in the posterior segment right upper lobe, felt to  represent pneumonia. Similar appearance is also noted in the inferior lingula. There is a mild degree of lower lobe bronchiectatic change bilaterally. There are somewhat ill-defined nodular opacities in the right upper lobe measuring between 4 and 5 mm, best seen on axial slices 33, 34, and 35 series 6. Upper Abdomen: There is hepatic steatosis. Visualized upper abdominal structures otherwise appear unremarkable. Musculoskeletal: There are no blastic or lytic bone lesions. No evident chest wall lesions. Review of the MIP images confirms the above findings. IMPRESSION: 1. No demonstrable pulmonary embolus. No thoracic aortic aneurysm or dissection. There are foci of coronary artery calcification. 2. Multifocal pneumonia as noted. Several 4-5 mm nodular opacities are noted in the right upper lobe which potentially could be related to pneumonia. No follow-up needed if patient is low-risk (and has no known or suspected primary neoplasm). Non-contrast chest CT can be considered in 12 months if patient is high-risk. This recommendation follows the consensus statement: Guidelines for Management of Incidental Pulmonary Nodules Detected on CT Images: From the Fleischner Society 2017; Radiology 2017; 284:228-243. There is mild lower lobe bronchiectatic change. 3. Multifocal adenopathy of uncertain etiology. Adenopathy potentially could be secondary to pneumonia. 4.  Small hiatal hernia. 5.  Hepatic steatosis. Electronically Signed   By: Bretta Bang III M.D.   On: 10/26/2018 13:39   Dg Chest Port 1 View  Result Date: 10/28/2018 CLINICAL DATA:  Multifocal pneumonia. EXAM: PORTABLE CHEST 1 VIEW COMPARISON:  Chest CT 10/26/2018 FINDINGS: Cardiomediastinal silhouette is normal. Mediastinal contours appear intact. There is no evidence of pleural effusion or pneumothorax. Worsening of the aeration of the lungs with widespread lower lobe predominant patchy airspace consolidation. Osseous structures are without acute  abnormality. Soft tissues are grossly normal. IMPRESSION: Worsening of the aeration of the lungs with widespread lower lobe predominant patchy airspace consolidation. Electronically Signed   By: Ted Mcalpine M.D.   On: 10/28/2018 08:56   Scheduled Meds:  amitriptyline  100 mg Oral QHS   aspirin  81 mg Oral Daily   budesonide (PULMICORT) nebulizer solution  0.25 mg Nebulization BID   enoxaparin (LOVENOX) injection  40 mg Subcutaneous Q24H   gabapentin  300 mg Oral BID   guaiFENesin  1,200 mg Oral BID   ipratropium-albuterol  3 mL Nebulization Q4H   methocarbamol  500 mg Oral BID PC   methylPREDNISolone (SOLU-MEDROL) injection  60 mg Intravenous Q6H   metoprolol tartrate  50 mg Oral BID  oseltamivir  75 mg Oral BID   pravastatin  80 mg Oral q1800   Continuous Infusions:  0.9 % NaCl with KCl 20 mEq / L 10 mL/hr at 10/27/18 1739   ceFEPime (MAXIPIME) IV 2 g (10/28/18 1048)    Active Problems:   Essential hypertension   Pneumonia   Acute respiratory failure with hypoxia (HCC)   Tobacco use   Influenza A  Time spent:   Standley Dakins, MD [facility] 10/28/2018, 12:34 PM    LOS: 2 days  How to contact the [facility] Attending or Consulting provider 7A - 7P or covering provider during after hours 7P -7A, for this patient?  1. Check the care team in [facility] and look for a) attending/consulting [facility] provider listed and b) the [facility] team listed 2. Log into [email or web address] and use [facility]'s universal password to access. If you do not have the password, please contact the hospital operator. 3. Locate the [facility] provider you are looking for under [facility] and page to a number that you can be directly reached. 4. If you still have difficulty reaching the provider, please page the [facility] (Director on Call) for the Hospitalists listed on amion for assistance.

## 2018-10-28 NOTE — Progress Notes (Signed)
Subjective: She is still short of breath but feels better. Still has pleuritic chest pain with cough.  Objective: Vital signs in last 24 hours: Temp:  [98.4 F (36.9 C)-98.7 F (37.1 C)] 98.4 F (36.9 C) (03/12 0642) Pulse Rate:  [95-108] 95 (03/12 0642) Resp:  [18-20] 20 (03/12 0642) BP: (127-137)/(66-82) 127/71 (03/12 0642) SpO2:  [93 %-100 %] 99 % (03/12 0744) Weight change:  Last BM Date: 10/27/18  Intake/Output from previous day: 03/11 0701 - 03/12 0700 In: 599.6 [P.O.:240; I.V.:72.4; IV Piggyback:287.2] Out: 800 [Urine:800]  PHYSICAL EXAM General appearance: alert, cooperative and no distress Resp: rales bilaterally and rhonchi bilaterally Cardio: regular rate and rhythm, S1, S2 normal, no murmur, click, rub or gallop GI: soft, non-tender; bowel sounds normal; no masses,  no organomegaly Extremities: extremities normal, atraumatic, no cyanosis or edema  Lab Results:  Results for orders placed or performed during the hospital encounter of 10/26/18 (from the past 48 hour(s))  Influenza panel by PCR (type A & B)     Status: Abnormal   Collection Time: 10/26/18  2:40 PM  Result Value Ref Range   Influenza A By PCR POSITIVE (A) NEGATIVE   Influenza B By PCR NEGATIVE NEGATIVE    Comment: (NOTE) The Xpert Xpress Flu assay is intended as an aid in the diagnosis of  influenza and should not be used as a sole basis for treatment.  This  assay is FDA approved for nasopharyngeal swab specimens only. Nasal  washings and aspirates are unacceptable for Xpert Xpress Flu testing. Performed at [facility], [address] [place], [place] [address]   Lactic acid, plasma     Status: None   Collection Time: 10/26/18  5:30 PM  Result Value Ref Range   Lactic Acid, Venous 1.3 0.5 - 1.9 mmol/L    Comment: Performed at [facility], [address], [place], [place] [address]  Respiratory Panel by PCR     Status: Abnormal   Collection Time: 10/26/18  5:47 PM  Result Value Ref Range   Adenovirus NOT DETECTED NOT DETECTED   Coronavirus 229E NOT DETECTED NOT DETECTED    Comment: (NOTE) The Coronavirus on the Respiratory Panel, DOES NOT test for the novel  Coronavirus (2019 nCoV)    Coronavirus HKU1 NOT DETECTED NOT DETECTED   Coronavirus NL63 NOT DETECTED NOT DETECTED   Coronavirus OC43 NOT DETECTED NOT DETECTED   Metapneumovirus NOT DETECTED NOT DETECTED   Rhinovirus / Enterovirus NOT DETECTED NOT DETECTED   Influenza A H1 2009 DETECTED (A) NOT DETECTED   Influenza B NOT DETECTED NOT DETECTED   Parainfluenza Virus 1 NOT DETECTED NOT DETECTED   Parainfluenza Virus 2 NOT DETECTED NOT DETECTED   Parainfluenza Virus 3 NOT DETECTED NOT DETECTED   Parainfluenza Virus 4 NOT DETECTED NOT DETECTED   Respiratory Syncytial Virus NOT DETECTED NOT DETECTED   Bordetella pertussis NOT DETECTED NOT DETECTED   Chlamydophila pneumoniae NOT DETECTED NOT DETECTED   Mycoplasma pneumoniae NOT DETECTED NOT DETECTED    Comment: Performed at [facility], [address], [place], [place] [address]  Culture, blood (routine x 2) Call MD if unable to obtain prior to antibiotics being given     Status: None (Preliminary result)   Collection Time: 10/26/18  6:06 PM  Result Value Ref Range   Specimen Description LEFT ANTECUBITAL    Special Requests      BOTTLES DRAWN AEROBIC ONLY Blood Culture adequate volume   Culture      NO  GROWTH 2 DAYS Performed at [facility], [address], [place], [place] [address]    Report Status PENDING   Culture, blood (routine x 2) Call MD if unable to obtain prior to antibiotics being given     Status: None (Preliminary result)   Collection Time: 10/26/18  6:06 PM  Result Value Ref Range   Specimen Description BLOOD LEFT HAND    Special Requests      BOTTLES DRAWN AEROBIC AND ANAEROBIC Blood Culture adequate volume   Culture      NO GROWTH 2 DAYS Performed at [facility], [address] [place], [place] [address]    Report Status PENDING   HIV  antibody (Routine Screening)     Status: None   Collection Time: 10/26/18  6:06 PM  Result Value Ref Range   HIV Screen 4th Generation wRfx Non Reactive Non Reactive    Comment: (NOTE) Performed At: [facility] [address] [place], [place] [id number] Jolene Schimke MD QQ:[id number]   Lactic acid, plasma     Status: None   Collection Time: 10/26/18  8:46 PM  Result Value Ref Range   Lactic Acid, Venous 1.0 0.5 - 1.9 mmol/L    Comment: Performed at [facility], [address] [place], [place] [address]  Strep pneumoniae urinary antigen     Status: None   Collection Time: 10/26/18  9:00 PM  Result Value Ref Range   Strep Pneumo Urinary Antigen NEGATIVE NEGATIVE    Comment:        Infection due to S. pneumoniae cannot be absolutely ruled out since the antigen present may be below the detection limit of the test. Performed at [facility], [address], [place], [place] [address]   Comprehensive metabolic panel     Status: Abnormal   Collection Time: 10/27/18  4:11 AM  Result Value Ref Range   Sodium 136 135 - 145 mmol/L   Potassium 3.8 3.5 - 5.1 mmol/L   Chloride 106 98 - 111 mmol/L   CO2 22 22 - 32 mmol/L   Glucose, Bld 157 (H) 70 - 99 mg/dL   BUN 8 6 - 20 mg/dL   Creatinine, Ser 6.60 0.44 - 1.00 mg/dL   Calcium 8.2 (L) 8.9 - 10.3 mg/dL   Total Protein 7.3 6.5 - 8.1 g/dL   Albumin 3.6 3.5 - 5.0 g/dL   AST 36 15 - 41 U/L   ALT 26 0 - 44 U/L   Alkaline Phosphatase 70 38 - 126 U/L   Total Bilirubin 0.4 0.3 - 1.2 mg/dL   GFR calc non Af Amer >60 >60 mL/min   GFR calc Af Amer >60 >60 mL/min   Anion gap 8 5 - 15    Comment: Performed at [facility], [address] [place], [place] [address]  CBC     Status: Abnormal   Collection Time: 10/27/18  4:11 AM  Result Value Ref Range   WBC 9.4 4.0 - 10.5 K/uL   RBC 4.39 3.87 - 5.11 MIL/uL   Hemoglobin 13.0 12.0 - 15.0 g/dL   HCT 01.0 93.2 - 35.5 %   MCV 93.6 80.0 - 100.0 fL   MCH 29.6 26.0 - 34.0 pg   MCHC  31.6 30.0 - 36.0 g/dL   RDW 73.2 (H) 20.2 - 54.2 %   Platelets 218 150 - 400 K/uL   nRBC 0.0 0.0 - 0.2 %    Comment: Performed at [facility], [address], [place],  [place] [address]  MRSA PCR Screening     Status: None   Collection Time: 10/27/18  8:51 AM  Result Value Ref Range   MRSA by PCR NEGATIVE NEGATIVE    Comment:        The GeneXpert MRSA Assay (FDA approved for NASAL specimens only), is one component of a comprehensive MRSA colonization surveillance program. It is not intended to diagnose MRSA infection nor to guide or monitor treatment for MRSA infections. Performed at [facility], [address] [place], [place] [address]     ABGS No results for input(s): PHART, PO2ART, TCO2, HCO3 in the last 72 hours.  Invalid input(s): PCO2 CULTURES Recent Results (from the past 240 hour(s))  Respiratory Panel by PCR     Status: Abnormal   Collection Time: 10/26/18  5:47 PM  Result Value Ref Range Status   Adenovirus NOT DETECTED NOT DETECTED Final   Coronavirus 229E NOT DETECTED NOT DETECTED Final    Comment: (NOTE) The Coronavirus on the Respiratory Panel, DOES NOT test for the novel  Coronavirus (2019 nCoV)    Coronavirus HKU1 NOT DETECTED NOT DETECTED Final   Coronavirus NL63 NOT DETECTED NOT DETECTED Final   Coronavirus OC43 NOT DETECTED NOT DETECTED Final   Metapneumovirus NOT DETECTED NOT DETECTED Final   Rhinovirus / Enterovirus NOT DETECTED NOT DETECTED Final   Influenza A H1 2009 DETECTED (A) NOT DETECTED Final   Influenza B NOT DETECTED NOT DETECTED Final   Parainfluenza Virus 1 NOT DETECTED NOT DETECTED Final   Parainfluenza Virus 2 NOT DETECTED NOT DETECTED Final   Parainfluenza Virus 3 NOT DETECTED NOT DETECTED Final   Parainfluenza Virus 4 NOT DETECTED NOT DETECTED Final   Respiratory Syncytial Virus NOT DETECTED NOT DETECTED Final   Bordetella pertussis NOT DETECTED NOT DETECTED Final   Chlamydophila pneumoniae NOT DETECTED NOT DETECTED Final    Mycoplasma pneumoniae NOT DETECTED NOT DETECTED Final    Comment: Performed at [facility], [address], [place], [place] [address]  Culture, blood (routine x 2) Call MD if unable to obtain prior to antibiotics being given     Status: None (Preliminary result)   Collection Time: 10/26/18  6:06 PM  Result Value Ref Range Status   Specimen Description LEFT ANTECUBITAL  Final   Special Requests   Final    BOTTLES DRAWN AEROBIC ONLY Blood Culture adequate volume   Culture   Final    NO GROWTH 2 DAYS Performed at [facility], [address] [place], [place] [address]    Report Status PENDING  Incomplete  Culture, blood (routine x 2) Call MD if unable to obtain prior to antibiotics being given     Status: None (Preliminary result)   Collection Time: 10/26/18  6:06 PM  Result Value Ref Range Status   Specimen Description BLOOD LEFT HAND  Final   Special Requests   Final    BOTTLES DRAWN AEROBIC AND ANAEROBIC Blood Culture adequate volume   Culture   Final    NO GROWTH 2 DAYS Performed at [facility], [address], [place], [place] [address]    Report Status PENDING  Incomplete  MRSA PCR Screening     Status: None   Collection Time: 10/27/18  8:51 AM  Result Value Ref Range Status   MRSA by PCR NEGATIVE NEGATIVE Final    Comment:        The GeneXpert MRSA Assay (FDA approved for NASAL specimens only), is one component of a comprehensive  MRSA colonization surveillance program. It is not intended to diagnose MRSA infection nor to guide or monitor treatment for MRSA infections. Performed at [facility], [address], [place], [place] [address]    Studies/Results: Ct Angio Chest Pe W And/or Wo Contrast  Result Date: 10/26/2018 CLINICAL DATA:  Shortness of Breath EXAM: CT ANGIOGRAPHY CHEST WITH CONTRAST TECHNIQUE: Multidetector CT imaging of the chest was performed using the standard protocol during bolus administration of intravenous contrast. Multiplanar CT image  reconstructions and MIPs were obtained to evaluate the vascular anatomy. CONTRAST:  OMNIPAQUE IOHEXOL 350 MG/ML SOLN COMPARISON:  Chest radiograph October 26, 2018 FINDINGS: Cardiovascular: There is no demonstrable pulmonary embolus. There is no thoracic aortic aneurysm or dissection. There is mild calcification in the proximal left common carotid artery. Visualized great vessels otherwise appear unremarkable. There are occasional foci of coronary artery calcification. There is no pericardial effusion or pericardial thickening. Mediastinum/Nodes: Visualized thyroid appears normal. There are several subcentimeter mediastinal lymph nodes. There is a pretracheal lymph node measuring 1.6 x 1.4 cm. There is a subcarinal lymph node measuring 1.8 x 1.6 cm. A second subcarinal lymph node measures 1.4 x 1.1 cm. There is a superior left hilar lymph node measuring 1.5 x 1.4 cm. There is a right hilar lymph node measuring 1.0 x 1.0 cm. There is a lymph node anterior to the carina measuring 1.2 x 1.0 cm. There is a small hiatal hernia. Lungs/Pleura: There is a focal area of airspace consolidation in the posterior segment of the left lower lobe. There is lower lobe atelectatic change as well. There is slight tree on but appearance in the posterior segment right upper lobe, felt to represent pneumonia. Similar appearance is also noted in the inferior lingula. There is a mild degree of lower lobe bronchiectatic change bilaterally. There are somewhat ill-defined nodular opacities in the right upper lobe measuring between 4 and 5 mm, best seen on axial slices 33, 34, and 35 series 6. Upper Abdomen: There is hepatic steatosis. Visualized upper abdominal structures otherwise appear unremarkable. Musculoskeletal: There are no blastic or lytic bone lesions. No evident chest wall lesions. Review of the MIP images confirms the above findings. IMPRESSION: 1. No demonstrable pulmonary embolus. No thoracic aortic aneurysm or dissection.  There are foci of coronary artery calcification. 2. Multifocal pneumonia as noted. Several 4-5 mm nodular opacities are noted in the right upper lobe which potentially could be related to pneumonia. No follow-up needed if patient is low-risk (and has no known or suspected primary neoplasm). Non-contrast chest CT can be considered in 12 months if patient is high-risk. This recommendation follows the consensus statement: Guidelines for Management of Incidental Pulmonary Nodules Detected on CT Images: From the Fleischner Society 2017; Radiology 2017; 284:228-243. There is mild lower lobe bronchiectatic change. 3. Multifocal adenopathy of uncertain etiology. Adenopathy potentially could be secondary to pneumonia. 4.  Small hiatal hernia. 5.  Hepatic steatosis. Electronically Signed   By: Bretta Bang III M.D.   On: 10/26/2018 13:39   Dg Chest Port 1 View  Result Date: 10/28/2018 CLINICAL DATA:  Multifocal pneumonia. EXAM: PORTABLE CHEST 1 VIEW COMPARISON:  Chest CT 10/26/2018 FINDINGS: Cardiomediastinal silhouette is normal. Mediastinal contours appear intact. There is no evidence of pleural effusion or pneumothorax. Worsening of the aeration of the lungs with widespread lower lobe predominant patchy airspace consolidation. Osseous structures are without acute abnormality. Soft tissues are grossly normal. IMPRESSION: Worsening of the aeration of the lungs with widespread lower lobe predominant patchy airspace  consolidation. Electronically Signed   By: Ted Mcalpine M.D.   On: 10/28/2018 08:56    Medications:  Prior to Admission:  Medications Prior to Admission  Medication Sig Dispense Refill Last Dose  . acetaminophen (TYLENOL) 650 MG CR tablet Take 650 mg by mouth every 8 (eight) hours as needed for pain. Takes two   Taking  . albuterol (PROVENTIL HFA;VENTOLIN HFA) 108 (90 Base) MCG/ACT inhaler Inhale 2 puffs into the lungs every 6 (six) hours as needed.   10/25/2018 at Unknown time  .  amitriptyline (ELAVIL) 100 MG tablet TAKE 1 TABLET (100 MG TOTAL) BY MOUTH AT BEDTIME. 30 tablet 0 Past Week at Unknown time  . amLODipine (NORVASC) 10 MG tablet Take 1 tablet (10 mg total) by mouth daily. 90 tablet 3 10/25/2018 at Unknown time  . aspirin 81 MG tablet Take 81 mg by mouth daily.   10/25/2018 at 700  . enalapril (VASOTEC) 20 MG tablet TAKE 1 TABLET BY MOUTH EVERY DAY 90 tablet 0 10/25/2018 at Unknown time  . meloxicam (MOBIC) 7.5 MG tablet TAKE 1 TABLET BY MOUTH TWICE A DAY WITH FOOD 60 tablet 0 10/25/2018 at Unknown time  . methocarbamol (ROBAXIN) 500 MG tablet Take 500 mg by mouth 2 (two) times daily after a meal.   10/25/2018 at Unknown time  . metoprolol (LOPRESSOR) 50 MG tablet TAKE 1 TABLET BY MOUTH TWICE A DAY 60 tablet 0 10/25/2018 at 700  . naproxen (NAPROSYN) 500 MG tablet TAKE 1 TABLET (500 MG TOTAL) BY MOUTH 2 (TWO) TIMES DAILY WITH A MEAL. 60 tablet 5 10/25/2018 at Unknown time  . pravastatin (PRAVACHOL) 80 MG tablet Take 80 mg by mouth daily.   10/25/2018 at Unknown time  . cyclobenzaprine (FLEXERIL) 10 MG tablet TAKE 1 TABLET (10 MG TOTAL) BY MOUTH 3 (THREE) TIMES DAILY AS NEEDED FOR MUSCLE SPASMS. (Patient not taking: Reported on 10/26/2018) 60 tablet 2 Completed Course at Unknown time  . gabapentin (NEURONTIN) 300 MG capsule Take 300 mg by mouth 2 (two) times daily.   Not Taking at Unknown time  . HYDROcodone-acetaminophen (NORCO) 5-325 MG tablet Take 1-2 tablets by mouth every 6 (six) hours as needed for severe pain. (Patient not taking: Reported on 03/17/2017) 12 tablet 0 Completed Course at Unknown time   Scheduled: . amitriptyline  100 mg Oral QHS  . aspirin  81 mg Oral Daily  . budesonide (PULMICORT) nebulizer solution  0.25 mg Nebulization BID  . enoxaparin (LOVENOX) injection  40 mg Subcutaneous Q24H  . gabapentin  300 mg Oral BID  . guaiFENesin  1,200 mg Oral BID  . ipratropium-albuterol  3 mL Nebulization Q4H  . methocarbamol  500 mg Oral BID PC  . methylPREDNISolone  (SOLU-MEDROL) injection  60 mg Intravenous Q6H  . metoprolol tartrate  50 mg Oral BID  . oseltamivir  75 mg Oral BID  . pravastatin  80 mg Oral q1800   Continuous: . 0.9 % NaCl with KCl 20 mEq / L 10 mL/hr at 10/27/18 1739  . ceFEPime (MAXIPIME) IV 2 g (10/28/18 1048)   JSE:[id number], ipratropium-albuterol, ketorolac, ondansetron (ZOFRAN) IV  Assesment:she has multifocal pneumonia.  Related to influenza A and she feels some better. She may have some element of COPD which is being treated. She has acute respiratory failure on oxygen Active Problems:   Essential hypertension   Pneumonia   Acute respiratory failure with hypoxia (HCC)   Tobacco use   Influenza A    Plan:continue antibiotics steroids bronchodilators  LOS: 2 days   Fredirick Maudlin 10/28/2018, 10:57 AM

## 2018-10-29 LAB — LEGIONELLA PNEUMOPHILA SEROGP 1 UR AG: L. pneumophila Serogp 1 Ur Ag: NEGATIVE

## 2018-10-29 MED ORDER — ALBUTEROL SULFATE HFA 108 (90 BASE) MCG/ACT IN AERS: 2.0000 | INHALATION_SPRAY | RESPIRATORY_TRACT | 1 refills | Status: AC | PRN

## 2018-10-29 MED ORDER — GABAPENTIN 300 MG PO CAPS: 300.0000 mg | ORAL_CAPSULE | Freq: Two times a day (BID) | ORAL | 0 refills | Status: DC

## 2018-10-29 MED ORDER — PREDNISONE 20 MG PO TABS: 20.0000 mg | ORAL_TABLET | Freq: Every day | ORAL | 0 refills | Status: AC

## 2018-10-29 MED ORDER — OSELTAMIVIR PHOSPHATE 75 MG PO CAPS: 75.0000 mg | ORAL_CAPSULE | Freq: Two times a day (BID) | ORAL | 0 refills | Status: AC

## 2018-10-29 MED ORDER — IPRATROPIUM-ALBUTEROL 0.5-2.5 (3) MG/3ML IN SOLN
3.0000 mL | Freq: Four times a day (QID) | RESPIRATORY_TRACT | Status: DC
  Filled 2018-10-29: qty 3

## 2018-10-29 MED ORDER — DOXYCYCLINE HYCLATE 100 MG PO CAPS: 100.0000 mg | ORAL_CAPSULE | Freq: Two times a day (BID) | ORAL | 0 refills | Status: AC

## 2018-10-29 MED ORDER — GUAIFENESIN ER 600 MG PO TB12: 1200.0000 mg | ORAL_TABLET | Freq: Two times a day (BID) | ORAL | 0 refills | Status: AC

## 2018-10-29 NOTE — Discharge Summary (Signed)
Physician Discharge Summary  Donna Fitzgerald:[id number] DOB: [birth year] DOA: 10/26/2018  PCP: [facility]  Admit date: 10/26/2018 Discharge date: 10/29/2018  Admitted From: Home  Disposition: Home   Recommendations for Outpatient Follow-up:  1. Follow up with PCP in 1 week:  Discharge Condition: STABLE   CODE STATUS: FULL     Brief Hospitalization Summary: Please see all hospital notes, images, labs for full details of the hospitalization. Dr. Benetta Spar HPI: Donna Fitzgerald is a 53 y.o. female with medical history significant of hypertension, back pain, chronic tobacco use, presents to the hospital with shortness of breath and cough.  She reports that she has had the symptoms for the past 4 days.  She has had cough productive of clear sputum.  She has had worsening shortness of breath and wheezing.  She has had nausea, but no vomiting.  No diarrhea.  She does have a headache.  She is felt generally weak.  She has not had any travel history.  She is unaware of having any contacts with similar symptoms.  ED Course: On arrival to the emergency room, she was noted to be febrile, tachycardic and hypoxic on ambulation.  CT chest performed shows multifocal pneumonia.  Patient was noted to be wheezing, received nebulizer treatments and was started on antibiotics.  Brief Admission Hx: 53 y.o.femalewith medical history significant ofhypertension, back pain, chronic tobacco use, presents to the hospital with shortness of breath and cough.  MDM/Assessment & Plan: 1. Acute respiratory failure with hypoxia - Secondary to pneumonia. Patient became hypoxic down to 80s with ambulation.    She has been weaned off oxygen and is now on room air and has been ambulating in the room with no difficulties. Continue steroids and other treatments.  Appreciate assistance of Dr. Juanetta Gosling with pulmonology.  Discharge home with outpatient follow-up with primary care provider. 2. Influenza A -  continue tamiflu treatment and supportive care.  Finish full course of Tamiflu.  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 3. Multifocal Community-acquired pneumonia -treated with cefepime/vancomycin. Discharged on oral doxycycline.  Continue supportive management with nebulizer treatments, mucolytics, antipyretics. blood cultures no growth to date. 4. Tobacco use - Counseled on the importance of tobacco cessation.  Patient verbalized understanding. 5. Essential Hypertension -resume home blood pressure medications.  DVT prophylaxis:Lovenox Code Status:Full code Family Communication:No family present Disposition Plan:Discharge home Consults called: Admission status:Inpatient, telemetry Consultants:  Juanetta Gosling  (pulmonology)   Procedures:    Antimicrobials:  Cefepime 3/11 > 3/13  Vancomycin 3/11 > 3/13  Discharge Diagnoses:  Active Problems:   Essential hypertension   Pneumonia   Acute respiratory failure with hypoxia (HCC)   Tobacco use   Influenza A   Discharge Instructions: Discharge Instructions    Call MD for:  difficulty breathing, headache or visual disturbances   Complete by:  As directed    Call MD for:  extreme fatigue   Complete by:  As directed    Call MD for:  persistant dizziness or light-headedness   Complete by:  As directed    Call MD for:  persistant nausea and vomiting   Complete by:  As directed    Call MD for:  severe uncontrolled pain   Complete by:  As directed    Increase activity slowly   Complete by:  As directed      Allergies as of 10/29/2018   No Known Allergies     Medication List  STOP taking these medications   cyclobenzaprine 10 MG tablet Commonly known as:  FLEXERIL   HYDROcodone-acetaminophen 5-325 MG tablet Commonly known as:  Norco   meloxicam 7.5 MG tablet Commonly known as:  MOBIC   naproxen 500 MG tablet Commonly known as:  NAPROSYN     TAKE these medications    acetaminophen 650 MG CR tablet Commonly known as:  TYLENOL Take 650 mg by mouth every 8 (eight) hours as needed for pain. Takes two   albuterol 108 (90 Base) MCG/ACT inhaler Commonly known as:  PROVENTIL HFA;VENTOLIN HFA Inhale 2 puffs into the lungs every 4 (four) hours as needed for wheezing or shortness of breath. What changed:    when to take this  reasons to take this   amitriptyline 100 MG tablet Commonly known as:  ELAVIL TAKE 1 TABLET (100 MG TOTAL) BY MOUTH AT BEDTIME.   amLODipine 10 MG tablet Commonly known as:  NORVASC Take 1 tablet (10 mg total) by mouth daily.   aspirin 81 MG tablet Take 81 mg by mouth daily.   doxycycline 100 MG capsule Commonly known as:  VIBRAMYCIN Take 1 capsule (100 mg total) by mouth 2 (two) times daily for 3 days.   enalapril 20 MG tablet Commonly known as:  VASOTEC TAKE 1 TABLET BY MOUTH EVERY DAY   gabapentin 300 MG capsule Commonly known as:  NEURONTIN Take 1 capsule (300 mg total) by mouth 2 (two) times daily for 30 days.   guaiFENesin 600 MG 12 hr tablet Commonly known as:  MUCINEX Take 2 tablets (1,200 mg total) by mouth 2 (two) times daily for 5 days.   methocarbamol 500 MG tablet Commonly known as:  ROBAXIN Take 500 mg by mouth 2 (two) times daily after a meal.   metoprolol tartrate 50 MG tablet Commonly known as:  LOPRESSOR TAKE 1 TABLET BY MOUTH TWICE A DAY   oseltamivir 75 MG capsule Commonly known as:  TAMIFLU Take 1 capsule (75 mg total) by mouth 2 (two) times daily for 5 doses.   pravastatin 80 MG tablet Commonly known as:  PRAVACHOL Take 80 mg by mouth daily.   predniSONE 20 MG tablet Commonly known as:  DELTASONE Take 1 tablet (20 mg total) by mouth daily with breakfast for 5 days.      Follow-up Information    [facility]. Schedule an appointment as soon as possible for a visit in 1 week(s).   Specialty:  General Practice Why:  Hospital Follow Up  Contact information: [address] [place] [address] [phone]          No Known Allergies Allergies as of 10/29/2018   No Known Allergies     Medication List    STOP taking these medications   cyclobenzaprine 10 MG tablet Commonly known as:  FLEXERIL   HYDROcodone-acetaminophen 5-325 MG tablet Commonly known as:  Norco   meloxicam 7.5 MG tablet Commonly known as:  MOBIC   naproxen 500 MG tablet Commonly known as:  NAPROSYN     TAKE these medications   acetaminophen 650 MG CR tablet Commonly known as:  TYLENOL Take 650 mg by mouth every 8 (eight) hours as needed for pain. Takes two   albuterol 108 (90 Base) MCG/ACT inhaler Commonly known as:  PROVENTIL HFA;VENTOLIN HFA Inhale 2 puffs into the lungs every 4 (four) hours as needed for wheezing or shortness of breath. What changed:    when to take this  reasons to take  this   amitriptyline 100 MG tablet Commonly known as:  ELAVIL TAKE 1 TABLET (100 MG TOTAL) BY MOUTH AT BEDTIME.   amLODipine 10 MG tablet Commonly known as:  NORVASC Take 1 tablet (10 mg total) by mouth daily.   aspirin 81 MG tablet Take 81 mg by mouth daily.   doxycycline 100 MG capsule Commonly known as:  VIBRAMYCIN Take 1 capsule (100 mg total) by mouth 2 (two) times daily for 3 days.   enalapril 20 MG tablet Commonly known as:  VASOTEC TAKE 1 TABLET BY MOUTH EVERY DAY   gabapentin 300 MG capsule Commonly known as:  NEURONTIN Take 1 capsule (300 mg total) by mouth 2 (two) times daily for 30 days.   guaiFENesin 600 MG 12 hr tablet Commonly known as:  MUCINEX Take 2 tablets (1,200 mg total) by mouth 2 (two) times daily for 5 days.   methocarbamol 500 MG tablet Commonly known as:  ROBAXIN Take 500 mg by mouth 2 (two) times daily after a meal.   metoprolol tartrate 50 MG tablet Commonly known as:  LOPRESSOR TAKE 1 TABLET BY MOUTH TWICE A DAY   oseltamivir 75 MG capsule Commonly known as:  TAMIFLU Take 1 capsule (75 mg total) by mouth 2  (two) times daily for 5 doses.   pravastatin 80 MG tablet Commonly known as:  PRAVACHOL Take 80 mg by mouth daily.   predniSONE 20 MG tablet Commonly known as:  DELTASONE Take 1 tablet (20 mg total) by mouth daily with breakfast for 5 days.       Procedures/Studies: Dg Chest 2 View  Result Date: 10/26/2018 CLINICAL DATA:  53 year old female with a history of cough and shortness of breath EXAM: CHEST - 2 VIEW COMPARISON:  08/12/2018, 01/20/2017 FINDINGS: Reticulonodular opacities throughout the lungs without confluent airspace disease. No evidence of pleural effusion or pneumothorax. Cardiomediastinal silhouette within normal limits. Degenerative changes of the spine IMPRESSION: Reticulonodular pattern of opacity suggests bronchopneumonia. Electronically Signed   By: Gilmer Mor D.O.   On: 10/26/2018 10:04   Ct Angio Chest Pe W And/or Wo Contrast  Result Date: 10/26/2018 CLINICAL DATA:  Shortness of Breath EXAM: CT ANGIOGRAPHY CHEST WITH CONTRAST TECHNIQUE: Multidetector CT imaging of the chest was performed using the standard protocol during bolus administration of intravenous contrast. Multiplanar CT image reconstructions and MIPs were obtained to evaluate the vascular anatomy. CONTRAST:  OMNIPAQUE IOHEXOL 350 MG/ML SOLN COMPARISON:  Chest radiograph October 26, 2018 FINDINGS: Cardiovascular: There is no demonstrable pulmonary embolus. There is no thoracic aortic aneurysm or dissection. There is mild calcification in the proximal left common carotid artery. Visualized great vessels otherwise appear unremarkable. There are occasional foci of coronary artery calcification. There is no pericardial effusion or pericardial thickening. Mediastinum/Nodes: Visualized thyroid appears normal. There are several subcentimeter mediastinal lymph nodes. There is a pretracheal lymph node measuring 1.6 x 1.4 cm. There is a subcarinal lymph node measuring 1.8 x 1.6 cm. A second subcarinal lymph node  measures 1.4 x 1.1 cm. There is a superior left hilar lymph node measuring 1.5 x 1.4 cm. There is a right hilar lymph node measuring 1.0 x 1.0 cm. There is a lymph node anterior to the carina measuring 1.2 x 1.0 cm. There is a small hiatal hernia. Lungs/Pleura: There is a focal area of airspace consolidation in the posterior segment of the left lower lobe. There is lower lobe atelectatic change as well. There is slight tree on but appearance in the posterior segment right  upper lobe, felt to represent pneumonia. Similar appearance is also noted in the inferior lingula. There is a mild degree of lower lobe bronchiectatic change bilaterally. There are somewhat ill-defined nodular opacities in the right upper lobe measuring between 4 and 5 mm, best seen on axial slices 33, 34, and 35 series 6. Upper Abdomen: There is hepatic steatosis. Visualized upper abdominal structures otherwise appear unremarkable. Musculoskeletal: There are no blastic or lytic bone lesions. No evident chest wall lesions. Review of the MIP images confirms the above findings. IMPRESSION: 1. No demonstrable pulmonary embolus. No thoracic aortic aneurysm or dissection. There are foci of coronary artery calcification. 2. Multifocal pneumonia as noted. Several 4-5 mm nodular opacities are noted in the right upper lobe which potentially could be related to pneumonia. No follow-up needed if patient is low-risk (and has no known or suspected primary neoplasm). Non-contrast chest CT can be considered in 12 months if patient is high-risk. This recommendation follows the consensus statement: Guidelines for Management of Incidental Pulmonary Nodules Detected on CT Images: From the Fleischner Society 2017; Radiology 2017; 284:228-243. There is mild lower lobe bronchiectatic change. 3. Multifocal adenopathy of uncertain etiology. Adenopathy potentially could be secondary to pneumonia. 4.  Small hiatal hernia. 5.  Hepatic steatosis. Electronically Signed   By:  Bretta Bang III M.D.   On: 10/26/2018 13:39   Dg Chest Port 1 View  Result Date: 10/28/2018 CLINICAL DATA:  Multifocal pneumonia. EXAM: PORTABLE CHEST 1 VIEW COMPARISON:  Chest CT 10/26/2018 FINDINGS: Cardiomediastinal silhouette is normal. Mediastinal contours appear intact. There is no evidence of pleural effusion or pneumothorax. Worsening of the aeration of the lungs with widespread lower lobe predominant patchy airspace consolidation. Osseous structures are without acute abnormality. Soft tissues are grossly normal. IMPRESSION: Worsening of the aeration of the lungs with widespread lower lobe predominant patchy airspace consolidation. Electronically Signed   By: Ted Mcalpine M.D.   On: 10/28/2018 08:56      Subjective: Patient reports that she is feeling better.  She is ambulating in the room and in no apparent distress.  She feels well to go home.  Discharge Exam: Vitals:   10/29/18 0532 10/29/18 0757  BP: (!) 144/91   Pulse: 82   Resp: 18   Temp: 98.2 F (36.8 C)   SpO2: 97% 96%   Vitals:   10/28/18 2058 10/29/18 0033 10/29/18 0532 10/29/18 0757  BP: (!) 150/81  (!) 144/91   Pulse: (!) 102  82   Resp: (!) 24  18   Temp: 97.7 F (36.5 C)  98.2 F (36.8 C)   TempSrc: Oral  Oral   SpO2: 95% 93% 97% 96%  Weight:      Height:       General: Pt is alert, awake, not in acute distress Cardiovascular: RRR, S1/S2 +, no rubs, no gallops Respiratory: Mostly clear and no wheezing, no rhonchi Abdominal: Soft, NT, ND, bowel sounds + Extremities: no edema, no cyanosis   The results of significant diagnostics from this hospitalization (including imaging, microbiology, ancillary and laboratory) are listed below for reference.     Microbiology: Recent Results (from the past 240 hour(s))  Respiratory Panel by PCR     Status: Abnormal   Collection Time: 10/26/18  5:47 PM  Result Value Ref Range Status   Adenovirus NOT DETECTED NOT DETECTED Final   Coronavirus 229E NOT  DETECTED NOT DETECTED Final    Comment: (NOTE) The Coronavirus on the Respiratory Panel, DOES NOT test for the novel  Coronavirus (2019 nCoV)    Coronavirus HKU1 NOT DETECTED NOT DETECTED Final   Coronavirus NL63 NOT DETECTED NOT DETECTED Final   Coronavirus OC43 NOT DETECTED NOT DETECTED Final   Metapneumovirus NOT DETECTED NOT DETECTED Final   Rhinovirus / Enterovirus NOT DETECTED NOT DETECTED Final   Influenza A H1 2009 DETECTED (A) NOT DETECTED Final   Influenza B NOT DETECTED NOT DETECTED Final   Parainfluenza Virus 1 NOT DETECTED NOT DETECTED Final   Parainfluenza Virus 2 NOT DETECTED NOT DETECTED Final   Parainfluenza Virus 3 NOT DETECTED NOT DETECTED Final   Parainfluenza Virus 4 NOT DETECTED NOT DETECTED Final   Respiratory Syncytial Virus NOT DETECTED NOT DETECTED Final   Bordetella pertussis NOT DETECTED NOT DETECTED Final   Chlamydophila pneumoniae NOT DETECTED NOT DETECTED Final   Mycoplasma pneumoniae NOT DETECTED NOT DETECTED Final    Comment: Performed at [facility], [address], [place], [place] [address]  Culture, blood (routine x 2) Call MD if unable to obtain prior to antibiotics being given     Status: None (Preliminary result)   Collection Time: 10/26/18  6:06 PM  Result Value Ref Range Status   Specimen Description LEFT ANTECUBITAL  Final   Special Requests   Final    BOTTLES DRAWN AEROBIC ONLY Blood Culture adequate volume   Culture   Final    NO GROWTH 3 DAYS Performed at [facility], [address] [place], [place] [address]    Report Status PENDING  Incomplete  Culture, blood (routine x 2) Call MD if unable to obtain prior to antibiotics being given     Status: None (Preliminary result)   Collection Time: 10/26/18  6:06 PM  Result Value Ref Range Status   Specimen Description BLOOD LEFT HAND  Final   Special Requests   Final    BOTTLES DRAWN AEROBIC AND ANAEROBIC Blood Culture adequate volume   Culture   Final    NO GROWTH 3  DAYS Performed at [facility], [address], [place], [place] [address]    Report Status PENDING  Incomplete  MRSA PCR Screening     Status: None   Collection Time: 10/27/18  8:51 AM  Result Value Ref Range Status   MRSA by PCR NEGATIVE NEGATIVE Final    Comment:        The GeneXpert MRSA Assay (FDA approved for NASAL specimens only), is one component of a comprehensive MRSA colonization surveillance program. It is not intended to diagnose MRSA infection nor to guide or monitor treatment for MRSA infections. Performed at [facility], [address], [place], [place] [address]      Labs: BNP (last 3 results) Recent Labs    10/26/18 1003  BNP 181.0*   Basic Metabolic Panel: Recent Labs  Lab 10/26/18 1003 10/27/18 0411  NA 136 136  K 3.7 3.8  CL 107 106  CO2 22 22  GLUCOSE 106* 157*  BUN 11 8  CREATININE 0.70 0.65  CALCIUM 8.4* 8.2*   Liver Function Tests: Recent Labs  Lab 10/27/18 0411  AST 36  ALT 26  ALKPHOS 70  BILITOT 0.4  PROT 7.3  ALBUMIN 3.6   No results for input(s): LIPASE, AMYLASE in the last 168 hours. No results for input(s): AMMONIA in the last 168 hours. CBC: Recent Labs  Lab 10/26/18 1003 10/27/18 0411  WBC 8.4 9.4  NEUTROABS 6.6  --   HGB 11.8* 13.0  HCT 36.8 41.1  MCV 94.4 93.6  PLT 231 218   Cardiac Enzymes: Recent Labs  Lab 10/26/18 1003  TROPONINI <0.03   BNP: Invalid input(s): POCBNP CBG: No results for input(s): GLUCAP in the last 168 hours. D-Dimer No results for input(s): DDIMER in the last 72 hours. Hgb A1c No results for input(s): HGBA1C in the last 72 hours. Lipid Profile No results for input(s): CHOL, HDL, LDLCALC, TRIG, CHOLHDL, LDLDIRECT in the last 72 hours. Thyroid function studies No results for input(s): TSH, T4TOTAL, T3FREE, THYROIDAB in the last 72 hours.  Invalid input(s): FREET3 Anemia work up No results for input(s): VITAMINB12, FOLATE, FERRITIN, TIBC, IRON, RETICCTPCT in the last 72  hours. Urinalysis No results found for: COLORURINE, APPEARANCEUR, LABSPEC, PHURINE, GLUCOSEU, HGBUR, BILIRUBINUR, KETONESUR, PROTEINUR, UROBILINOGEN, NITRITE, LEUKOCYTESUR Sepsis Labs Invalid input(s): PROCALCITONIN,  WBC,  LACTICIDVEN Microbiology Recent Results (from the past 240 hour(s))  Respiratory Panel by PCR     Status: Abnormal   Collection Time: 10/26/18  5:47 PM  Result Value Ref Range Status   Adenovirus NOT DETECTED NOT DETECTED Final   Coronavirus 229E NOT DETECTED NOT DETECTED Final    Comment: (NOTE) The Coronavirus on the Respiratory Panel, DOES NOT test for the novel  Coronavirus (2019 nCoV)    Coronavirus HKU1 NOT DETECTED NOT DETECTED Final   Coronavirus NL63 NOT DETECTED NOT DETECTED Final   Coronavirus OC43 NOT DETECTED NOT DETECTED Final   Metapneumovirus NOT DETECTED NOT DETECTED Final   Rhinovirus / Enterovirus NOT DETECTED NOT DETECTED Final   Influenza A H1 2009 DETECTED (A) NOT DETECTED Final   Influenza B NOT DETECTED NOT DETECTED Final   Parainfluenza Virus 1 NOT DETECTED NOT DETECTED Final   Parainfluenza Virus 2 NOT DETECTED NOT DETECTED Final   Parainfluenza Virus 3 NOT DETECTED NOT DETECTED Final   Parainfluenza Virus 4 NOT DETECTED NOT DETECTED Final   Respiratory Syncytial Virus NOT DETECTED NOT DETECTED Final   Bordetella pertussis NOT DETECTED NOT DETECTED Final   Chlamydophila pneumoniae NOT DETECTED NOT DETECTED Final   Mycoplasma pneumoniae NOT DETECTED NOT DETECTED Final    Comment: Performed at [facility], [address] [place], [place] [address]  Culture, blood (routine x 2) Call MD if unable to obtain prior to antibiotics being given     Status: None (Preliminary result)   Collection Time: 10/26/18  6:06 PM  Result Value Ref Range Status   Specimen Description LEFT ANTECUBITAL  Final   Special Requests   Final    BOTTLES DRAWN AEROBIC ONLY Blood Culture adequate volume   Culture   Final    NO GROWTH 3 DAYS Performed at  [facility], [address] [place], [place] [address]    Report Status PENDING  Incomplete  Culture, blood (routine x 2) Call MD if unable to obtain prior to antibiotics being given     Status: None (Preliminary result)   Collection Time: 10/26/18  6:06 PM  Result Value Ref Range Status   Specimen Description BLOOD LEFT HAND  Final   Special Requests   Final    BOTTLES DRAWN AEROBIC AND ANAEROBIC Blood Culture adequate volume   Culture   Final    NO GROWTH 3 DAYS Performed at [facility], [address] [place], [place] [address]    Report Status PENDING  Incomplete  MRSA PCR Screening     Status: None   Collection Time: 10/27/18  8:51 AM  Result Value Ref Range Status   MRSA by PCR NEGATIVE NEGATIVE Final  Comment:        The GeneXpert MRSA Assay (FDA approved for NASAL specimens only), is one component of a comprehensive MRSA colonization surveillance program. It is not intended to diagnose MRSA infection nor to guide or monitor treatment for MRSA infections. Performed at [facility], [address] [place], [place] [address]    Time coordinating discharge: 33 minutes   SIGNED:  Standley Dakins, MD  [facility] 10/29/2018, 10:59 AM How to contact the [facility] Attending or Consulting provider 7A - 7P or covering provider during after hours 7P -7A, for this patient?  1. Check the care team in [facility] and look for a) attending/consulting [facility] provider listed and b) the [facility] team listed 2. Log into [email or web address] and use [facility] universal password to access. If you do not have the password, please contact the hospital operator. 3. Locate the [facility] provider you are looking for under [facility] and page to a number that you can be directly reached. 4. If you still have difficulty reaching the provider, please page the [facility] (Director on Call) for the Hospitalists listed on amion for assistance.

## 2018-10-29 NOTE — Discharge Instructions (Signed)
Please stay out of work until next Wednesday.  Please complete course of tamiflu as prescribed.    IMPORTANT INFORMATION: PAY CLOSE ATTENTION   PHYSICIAN DISCHARGE INSTRUCTIONS  Follow with Primary care provider  [facility]  and other consultants as instructed your Hospitalist Physician  SEEK MEDICAL CARE OR RETURN TO EMERGENCY ROOM IF SYMPTOMS COME BACK, WORSEN OR NEW PROBLEM DEVELOPS.   Please note: You were cared for by a hospitalist during your hospital stay. Every effort will be made to forward records to your primary care provider.  You can request that your primary care provider send for your hospital records if they have not received them.  Once you are discharged, your primary care physician will handle any further medical issues. Please note that NO REFILLS for any discharge medications will be authorized once you are discharged, as it is imperative that you return to your primary care physician (or establish a relationship with a primary care physician if you do not have one) for your post hospital discharge needs so that they can reassess your need for medications and monitor your lab values.  Please get a complete blood count and chemistry panel checked by your Primary MD at your next visit, and again as instructed by your Primary MD.  Get Medicines reviewed and adjusted: Please take all your medications with you for your next visit with your Primary MD  Laboratory/radiological data: Please request your Primary MD to go over all hospital tests and procedure/radiological results at the follow up, please ask your primary care provider to get all Hospital records sent to his/her office.  In some cases, they will be blood work, cultures and biopsy results pending at the time of your discharge. Please request that your primary care provider follow up on these results.  If you are diabetic, please bring your blood sugar readings with you to your follow up  appointment with primary care.    Please call and make your follow up appointments as soon as possible.    Also Note the following: If you experience worsening of your admission symptoms, develop shortness of breath, life threatening emergency, suicidal or homicidal thoughts you must seek medical attention immediately by calling 911 or calling your MD immediately  if symptoms less severe.  You must read complete instructions/literature along with all the possible adverse reactions/side effects for all the Medicines you take and that have been prescribed to you. Take any new Medicines after you have completely understood and accpet all the possible adverse reactions/side effects.   Do not drive when taking Pain medications or sleeping medications (Benzodiazepines)  Do not take more than prescribed Pain, Sleep and Anxiety Medications. It is not advisable to combine anxiety,sleep and pain medications without talking with your primary care practitioner  Special Instructions: If you have smoked or chewed Tobacco  in the last 2 yrs please stop smoking, stop any regular Alcohol  and or any Recreational drug use.  Wear Seat belts while driving.        Acute Respiratory Distress Syndrome, Adult  Acute respiratory distress syndrome is a life-threatening condition in which fluid collects in the lungs. This prevents the lungs from filling with air and passing oxygen into the blood. This can cause the lungs and other vital organs to fail. The condition usually develops following an infection, illness, surgery, or injury. What are the causes? This condition may be caused by:  An infection, such as sepsis or pneumonia.  A serious injury to  the head or chest.  Severe bleeding from an injury.  A major surgery.  Breathing in harmful chemicals or smoke.  Blood transfusions.  A blood clot in the lungs.  Breathing in vomit (aspiration).  Near-drowning.  Inflammation of the pancreas  (pancreatitis).  A drug overdose. What are the signs or symptoms? Sudden shortness of breath and rapid breathing are the main symptoms of this condition. Other symptoms may include:  A fast or irregular heartbeat.  Skin, lips, or fingernails that look blue (cyanosis).  Confusion.  Tiredness or loss of energy.  Chest pain, particularly while taking a breath.  Coughing.  Restlessness or anxiety.  Fever. This is usually present if there is an underlying infection, such as pneumonia. How is this diagnosed? This condition is diagnosed based on:  Your symptoms.  Medical history.  A physical exam. During the exam, your health care provider will listen to your heart and check for crackling or wheezing sounds in your lungs. You may also have other tests to confirm the diagnosis and measure how well your lungs are working. These may include:  Measuring the amount of oxygen in your blood. Your health care provider will use two methods to do this procedure: ? A small device (pulse oximeter) that is placed on your finger, earlobe, or toe. ? An arterial blood gas test. A sample of blood is taken from an artery and tested for oxygen levels.  Blood tests.  Chest X-rays or CT scans to look for fluid in the lungs.  Taking a sample of your sputum to test for infection.  Heart test, such as an echocardiogram or electrocardiogram. This is done to rule out any heart problems (such as heart failure) that may be causing your symptoms.  Bronchoscopy. During this test, a thin, flexible tube with a light is passed into the mouth or nose, down the windpipe, and into the lungs. How is this treated? Treatment depends on the cause of your condition. The goal is to support you while your lungs heal and the underlying cause is treated. Treatment may include:  Oxygen therapy. This may be done through: ? A tube in your nose or a face mask. ? A ventilator. This device helps move air into and out of your  lungs through a breathing tube that is inserted into your mouth or nose.  Continuous positive airway pressure (CPAP). This treatment uses mild air pressure to keep the airways open. A mask or other device will be placed over your nose or mouth.  Tracheostomy. During this procedure, a small cut is made in your neck to create an opening to your windpipe. A breathing tube is placed directly into your windpipe. The breathing tube is connected to a ventilator. This is done if you have problems with your airway or if you need a ventilator for a long period of time.  Positioning you to lie on your stomach (prone position).  Medicines, such as: ? Sedatives to help you relax. ? Blood pressure medicines. ? Antibiotics to treat infection. ? Blood thinners to prevent blood clots. ? Diuretics to help prevent excess fluid.  Fluids and nutrients given through an IV tube.  Wearing compression stockings on your legs to prevent blood clots.  Extra corporeal membrane oxygenation (ECMO). This treatment takes blood outside your body, adds oxygen, and removes carbon dioxide. The blood is then returned to your body. This treatment is only used in severe cases. Follow these instructions at home:  Take over-the-counter and prescription medicines only  as told by your health care provider.  Do not use any products that contain nicotine or tobacco, such as cigarettes and e-cigarettes. If you need help quitting, ask your health care provider.  Limit alcohol intake to no more than 1 drink per day for nonpregnant women and 2 drinks per day for men. One drink equals 12 oz of beer, 5 oz of wine, or 1 oz of hard liquor.  Ask friends and family to help you if daily activities make you tired.  Attend any pulmonary rehabilitation as told by your health care provider. This may include: ? Education about your condition. ? Exercises. ? Breathing training. ? Counseling. ? Learning techniques to conserve  energy. ? Nutrition counseling.  Keep all follow-up visits as told by your health care provider. This is important. Contact a health care provider if:  You become short of breath during activity or while resting.  You develop a cough that does not go away.  You have a fever.  Your symptoms do not get better or they get worse.  You become anxious or depressed. Get help right away if:  You have sudden shortness of breath.  You develop sudden chest pain that does not go away.  You develop a rapid heart rate.  You develop swelling or pain in one of your legs.  You cough up blood.  You have trouble breathing.  Your skin, lips, or fingernails turn blue. These symptoms may represent a serious problem that is an emergency. Do not wait to see if the symptoms will go away. Get medical help right away. Call your local emergency services (911 in the [place] Do not drive yourself to the hospital. Summary  Acute respiratory distress syndrome is a life-threatening condition in which fluid collects in the lungs, which leads the lungs and other vital organs to fail.  This condition usually develops following an infection, illness, surgery, or injury.  Sudden shortness of breath and rapid breathing are the main symptoms of acute respiratory distress syndrome.  Treatment may include oxygen therapy, continuous positive airway pressure (CPAP), tracheostomy, lying on your stomach (prone position), medicines, fluids and nutrients given through an IV tube, compression stockings, and extra corporeal membrane oxygenation (ECMO). This information is not intended to replace advice given to you by your health care provider. Make sure you discuss any questions you have with your health care provider. Document Released: 08/04/2005 Document Revised: 07/21/2016 Document Reviewed: 07/21/2016 Elsevier Interactive Patient Education  2019 [employer]  Influenza, Adult Influenza is also called "the flu." It  is an infection in the lungs, nose, and throat (respiratory tract). It is caused by a virus. The flu causes symptoms that are similar to symptoms of a cold. It also causes a high fever and body aches. The flu spreads easily from person to person (is contagious). Getting a flu shot (influenza vaccination) every year is the best way to prevent the flu. What are the causes? This condition is caused by the influenza virus. You can get the virus by:  Breathing in droplets that are in the air from the cough or sneeze of a person who has the virus.  Touching something that has the virus on it (is contaminated) and then touching your mouth, nose, or eyes. What increases the risk? Certain things may make you more likely to get the flu. These include:  Not washing your hands often.  Having close contact with many people during cold and flu season.  Touching your mouth, eyes, or  nose without first washing your hands.  Not getting a flu shot every year. You may have a higher risk for the flu, along with serious problems such as a lung infection (pneumonia), if you:  Are older than 65.  Are pregnant.  Have a weakened disease-fighting system (immune system) because of a disease or taking certain medicines.  Have a long-term (chronic) illness, such as: ? Heart, kidney, or lung disease. ? Diabetes. ? Asthma.  Have a liver disorder.  Are very overweight (morbidly obese).  Have anemia. This is a condition that affects your red blood cells. What are the signs or symptoms? Symptoms usually begin suddenly and last 4-14 days. They may include:  Fever and chills.  Headaches, body aches, or muscle aches.  Sore throat.  Cough.  Runny or stuffy (congested) nose.  Chest discomfort.  Not wanting to eat as much as normal (poor appetite).  Weakness or feeling tired (fatigue).  Dizziness.  Feeling sick to your stomach (nauseous) or throwing up (vomiting). How is this treated? If the flu  is found early, you can be treated with medicine that can help reduce how bad the illness is and how long it lasts (antiviral medicine). This may be given by mouth (orally) or through an IV tube. Taking care of yourself at home can help your symptoms get better. Your doctor may suggest:  Taking over-the-counter medicines.  Drinking plenty of fluids. The flu often goes away on its own. If you have very bad symptoms or other problems, you may be treated in a hospital. Follow these instructions at home:     Activity  Rest as needed. Get plenty of sleep.  Stay home from work or school as told by your doctor. ? Do not leave home until you do not have a fever for 24 hours without taking medicine. ? Leave home only to visit your doctor. Eating and drinking  Take an ORS (oral rehydration solution). This is a drink that is sold at pharmacies and stores.  Drink enough fluid to keep your pee (urine) pale yellow.  Drink clear fluids in small amounts as you are able. Clear fluids include: ? Water. ? Ice chips. ? Fruit juice that has water added (diluted fruit juice). ? Low-calorie sports drinks.  Eat bland, easy-to-digest foods in small amounts as you are able. These foods include: ? Bananas. ? Applesauce. ? Rice. ? Lean meats. ? Toast. ? Crackers.  Do not eat or drink: ? Fluids that have a lot of sugar or caffeine. ? Alcohol. ? Spicy or fatty foods. General instructions  Take over-the-counter and prescription medicines only as told by your doctor.  Use a cool mist humidifier to add moisture to the air in your home. This can make it easier for you to breathe.  Cover your mouth and nose when you cough or sneeze.  Wash your hands with soap and water often, especially after you cough or sneeze. If you cannot use soap and water, use alcohol-based hand sanitizer.  Keep all follow-up visits as told by your doctor. This is important. How is this prevented?   Get a flu shot every  year. You may get the flu shot in late summer, fall, or winter. Ask your doctor when you should get your flu shot.  Avoid contact with people who are sick during fall and winter (cold and flu season). Contact a doctor if:  You get new symptoms.  You have: ? Chest pain. ? Watery poop (diarrhea). ? A fever.  Your cough gets worse.  You start to have more mucus.  You feel sick to your stomach.  You throw up. Get help right away if you:  Have shortness of breath.  Have trouble breathing.  Have skin or nails that turn a bluish color.  Have very bad pain or stiffness in your neck.  Get a sudden headache.  Get sudden pain in your face or ear.  Cannot eat or drink without throwing up. Summary  Influenza ("the flu") is an infection in the lungs, nose, and throat. It is caused by a virus.  Take over-the-counter and prescription medicines only as told by your doctor.  Getting a flu shot every year is the best way to avoid getting the flu. This information is not intended to replace advice given to you by your health care provider. Make sure you discuss any questions you have with your health care provider. Document Released: 05/13/2008 Document Revised: 01/20/2018 Document Reviewed: 01/20/2018 Elsevier Interactive Patient Education  2019 [employer].

## 2018-10-29 NOTE — Progress Notes (Signed)
Subjective: She says she feels much better.  She is still coughing mostly nonproductively.  She is not having chest pain now.  She is been able to ambulate in the room.  She hopes to go home soon  Objective: Vital signs in last 24 hours: Temp:  [97.7 F (36.5 C)-98.2 F (36.8 C)] 98.2 F (36.8 C) (03/13 0532) Pulse Rate:  [82-102] 82 (03/13 0532) Resp:  [18-24] 18 (03/13 0532) BP: (144-150)/(81-91) 144/91 (03/13 0532) SpO2:  [93 %-98 %] 96 % (03/13 0757) Weight change:  Last BM Date: 10/27/18  Intake/Output from previous day: 03/12 0701 - 03/13 0700 In: 1301.4 [P.O.:960; I.V.:38.4; IV Piggyback:303] Out: -   PHYSICAL EXAM General appearance: alert, cooperative and no distress Resp: Her chest is much clearer than previously Cardio: regular rate and rhythm, S1, S2 normal, no murmur, click, rub or gallop GI: soft, non-tender; bowel sounds normal; no masses,  no organomegaly Extremities: extremities normal, atraumatic, no cyanosis or edema  Lab Results:  Results for orders placed or performed during the hospital encounter of 10/26/18 (from the past 48 hour(s))  MRSA PCR Screening     Status: None   Collection Time: 10/27/18  8:51 AM  Result Value Ref Range   MRSA by PCR NEGATIVE NEGATIVE    Comment:        The GeneXpert MRSA Assay (FDA approved for NASAL specimens only), is one component of a comprehensive MRSA colonization surveillance program. It is not intended to diagnose MRSA infection nor to guide or monitor treatment for MRSA infections. Performed at [facility], [address] [place], [place] [address]     ABGS No results for input(s): PHART, PO2ART, TCO2, HCO3 in the last 72 hours.  Invalid input(s): PCO2 CULTURES Recent Results (from the past 240 hour(s))  Respiratory Panel by PCR     Status: Abnormal   Collection Time: 10/26/18  5:47 PM  Result Value Ref Range Status   Adenovirus NOT DETECTED NOT DETECTED Final   Coronavirus 229E NOT DETECTED NOT  DETECTED Final    Comment: (NOTE) The Coronavirus on the Respiratory Panel, DOES NOT test for the novel  Coronavirus (2019 nCoV)    Coronavirus HKU1 NOT DETECTED NOT DETECTED Final   Coronavirus NL63 NOT DETECTED NOT DETECTED Final   Coronavirus OC43 NOT DETECTED NOT DETECTED Final   Metapneumovirus NOT DETECTED NOT DETECTED Final   Rhinovirus / Enterovirus NOT DETECTED NOT DETECTED Final   Influenza A H1 2009 DETECTED (A) NOT DETECTED Final   Influenza B NOT DETECTED NOT DETECTED Final   Parainfluenza Virus 1 NOT DETECTED NOT DETECTED Final   Parainfluenza Virus 2 NOT DETECTED NOT DETECTED Final   Parainfluenza Virus 3 NOT DETECTED NOT DETECTED Final   Parainfluenza Virus 4 NOT DETECTED NOT DETECTED Final   Respiratory Syncytial Virus NOT DETECTED NOT DETECTED Final   Bordetella pertussis NOT DETECTED NOT DETECTED Final   Chlamydophila pneumoniae NOT DETECTED NOT DETECTED Final   Mycoplasma pneumoniae NOT DETECTED NOT DETECTED Final    Comment: Performed at [facility], [address] [place], [place] [address]  Culture, blood (routine x 2) Call MD if unable to obtain prior to antibiotics being given     Status: None (Preliminary result)   Collection Time: 10/26/18  6:06 PM  Result Value Ref Range Status   Specimen Description LEFT ANTECUBITAL  Final   Special Requests   Final    BOTTLES DRAWN AEROBIC ONLY Blood Culture adequate volume   Culture  Final    NO GROWTH 3 DAYS Performed at [facility], [address] [place], [place] [address]    Report Status PENDING  Incomplete  Culture, blood (routine x 2) Call MD if unable to obtain prior to antibiotics being given     Status: None (Preliminary result)   Collection Time: 10/26/18  6:06 PM  Result Value Ref Range Status   Specimen Description BLOOD LEFT HAND  Final   Special Requests   Final    BOTTLES DRAWN AEROBIC AND ANAEROBIC Blood Culture adequate volume   Culture   Final    NO GROWTH 3 DAYS Performed at [facility], [address], [place], [place] [address]    Report Status PENDING  Incomplete  MRSA PCR Screening     Status: None   Collection Time: 10/27/18  8:51 AM  Result Value Ref Range Status   MRSA by PCR NEGATIVE NEGATIVE Final    Comment:        The GeneXpert MRSA Assay (FDA approved for NASAL specimens only), is one component of a comprehensive MRSA colonization surveillance program. It is not intended to diagnose MRSA infection nor to guide or monitor treatment for MRSA infections. Performed at [facility], [address], [place], [place] [address]    Studies/Results: Dg Chest Port 1 View  Result Date: 10/28/2018 CLINICAL DATA:  Multifocal pneumonia. EXAM: PORTABLE CHEST 1 VIEW COMPARISON:  Chest CT 10/26/2018 FINDINGS: Cardiomediastinal silhouette is normal. Mediastinal contours appear intact. There is no evidence of pleural effusion or pneumothorax. Worsening of the aeration of the lungs with widespread lower lobe predominant patchy airspace consolidation. Osseous structures are without acute abnormality. Soft tissues are grossly normal. IMPRESSION: Worsening of the aeration of the lungs with widespread lower lobe predominant patchy airspace consolidation. Electronically Signed   By: Ted Mcalpine M.D.   On: 10/28/2018 08:56    Medications:  Prior to Admission:  Medications Prior to Admission  Medication Sig Dispense Refill Last Dose  . acetaminophen (TYLENOL) 650 MG CR tablet Take 650 mg by mouth every 8 (eight) hours as needed for pain. Takes two   Taking  . albuterol (PROVENTIL HFA;VENTOLIN HFA) 108 (90 Base) MCG/ACT inhaler Inhale 2 puffs into the lungs every 6 (six) hours as needed.   10/25/2018 at Unknown time  . amitriptyline (ELAVIL) 100 MG tablet TAKE 1 TABLET (100 MG TOTAL) BY MOUTH AT BEDTIME. 30 tablet 0 Past Week at Unknown time  . amLODipine (NORVASC) 10 MG tablet Take 1 tablet (10 mg total) by mouth daily. 90 tablet 3 10/25/2018 at Unknown time  . aspirin 81  MG tablet Take 81 mg by mouth daily.   10/25/2018 at 700  . enalapril (VASOTEC) 20 MG tablet TAKE 1 TABLET BY MOUTH EVERY DAY 90 tablet 0 10/25/2018 at Unknown time  . meloxicam (MOBIC) 7.5 MG tablet TAKE 1 TABLET BY MOUTH TWICE A DAY WITH FOOD 60 tablet 0 10/25/2018 at Unknown time  . methocarbamol (ROBAXIN) 500 MG tablet Take 500 mg by mouth 2 (two) times daily after a meal.   10/25/2018 at Unknown time  . metoprolol (LOPRESSOR) 50 MG tablet TAKE 1 TABLET BY MOUTH TWICE A DAY 60 tablet 0 10/25/2018 at 700  . naproxen (NAPROSYN) 500 MG tablet TAKE 1 TABLET (500 MG TOTAL) BY MOUTH 2 (TWO) TIMES DAILY WITH A MEAL. 60 tablet 5 10/25/2018 at Unknown time  . pravastatin (PRAVACHOL) 80 MG tablet Take 80 mg by mouth daily.   10/25/2018 at Unknown time  .  cyclobenzaprine (FLEXERIL) 10 MG tablet TAKE 1 TABLET (10 MG TOTAL) BY MOUTH 3 (THREE) TIMES DAILY AS NEEDED FOR MUSCLE SPASMS. (Patient not taking: Reported on 10/26/2018) 60 tablet 2 Completed Course at Unknown time  . gabapentin (NEURONTIN) 300 MG capsule Take 300 mg by mouth 2 (two) times daily.   Not Taking at Unknown time  . HYDROcodone-acetaminophen (NORCO) 5-325 MG tablet Take 1-2 tablets by mouth every 6 (six) hours as needed for severe pain. (Patient not taking: Reported on 03/17/2017) 12 tablet 0 Completed Course at Unknown time   Scheduled: . amitriptyline  100 mg Oral QHS  . aspirin  81 mg Oral Daily  . budesonide (PULMICORT) nebulizer solution  0.25 mg Nebulization BID  . enoxaparin (LOVENOX) injection  40 mg Subcutaneous Q24H  . gabapentin  300 mg Oral BID  . guaiFENesin  1,200 mg Oral BID  . ipratropium-albuterol  3 mL Nebulization Q6H  . methocarbamol  500 mg Oral BID PC  . methylPREDNISolone (SOLU-MEDROL) injection  60 mg Intravenous Q6H  . metoprolol tartrate  50 mg Oral BID  . oseltamivir  75 mg Oral BID  . pravastatin  80 mg Oral q1800   Continuous: . 0.9 % NaCl with KCl 20 mEq / L 10 mL/hr at 10/27/18 1739  . ceFEPime (MAXIPIME) IV Stopped  (10/28/18 2342)   NOM:[id number], ipratropium-albuterol, ketorolac, ondansetron (ZOFRAN) IV  Assesment: She was admitted with acute hypoxic respiratory failure related to influenza A and multifocal pneumonia.  She is improving.  She has a significant smoking history and she is being treated for COPD as well.  She is approaching baseline Active Problems:   Essential hypertension   Pneumonia   Acute respiratory failure with hypoxia (HCC)   Tobacco use   Influenza A    Plan: See how she does as far as oxygen is concerned.  I think it is okay to switch her to oral meds now.  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

## 2018-10-29 NOTE — Progress Notes (Signed)
IV discontinued,catheter intact. Patient discharged home with instructions given on medications and follow up visits,patient verbalized understanding. Prescriptions sent with patient. Accompanied by staff to an awaiting vehicle.

## 2018-11-01 LAB — CULTURE, BLOOD (ROUTINE X 2)
Culture: NO GROWTH
Culture: NO GROWTH
Special Requests: ADEQUATE
Special Requests: ADEQUATE

## 2019-07-12 ENCOUNTER — Emergency Department (HOSPITAL_COMMUNITY): Payer: Commercial Managed Care - PPO

## 2019-07-12 ENCOUNTER — Emergency Department (HOSPITAL_COMMUNITY)
Admission: EM | Admit: 2019-07-12 | Discharge: 2019-07-12 | Disposition: A | Discharge status: Home / Self Care | Payer: Commercial Managed Care - PPO | Attending: Emergency Medicine | Admitting: Emergency Medicine

## 2019-07-12 ENCOUNTER — Other Ambulatory Visit: Payer: Self-pay

## 2019-07-12 ENCOUNTER — Encounter (HOSPITAL_COMMUNITY): Payer: Self-pay | Admitting: Emergency Medicine

## 2019-07-12 DIAGNOSIS — W109XXA Fall (on) (from) unspecified stairs and steps, initial encounter: Secondary | ICD-10-CM | POA: Diagnosis not present

## 2019-07-12 DIAGNOSIS — S93401A Sprain of unspecified ligament of right ankle, initial encounter: Secondary | ICD-10-CM | POA: Diagnosis not present

## 2019-07-12 DIAGNOSIS — Z7982 Long term (current) use of aspirin: Secondary | ICD-10-CM | POA: Insufficient documentation

## 2019-07-12 DIAGNOSIS — I10 Essential (primary) hypertension: Secondary | ICD-10-CM | POA: Diagnosis not present

## 2019-07-12 DIAGNOSIS — Z20828 Contact with and (suspected) exposure to other viral communicable diseases: Secondary | ICD-10-CM | POA: Diagnosis not present

## 2019-07-12 DIAGNOSIS — R591 Generalized enlarged lymph nodes: Secondary | ICD-10-CM | POA: Diagnosis not present

## 2019-07-12 DIAGNOSIS — R05 Cough: Secondary | ICD-10-CM | POA: Insufficient documentation

## 2019-07-12 DIAGNOSIS — S99911A Unspecified injury of right ankle, initial encounter: Secondary | ICD-10-CM | POA: Diagnosis present

## 2019-07-12 DIAGNOSIS — F17210 Nicotine dependence, cigarettes, uncomplicated: Secondary | ICD-10-CM | POA: Diagnosis not present

## 2019-07-12 DIAGNOSIS — Y999 Unspecified external cause status: Secondary | ICD-10-CM | POA: Insufficient documentation

## 2019-07-12 DIAGNOSIS — Y929 Unspecified place or not applicable: Secondary | ICD-10-CM | POA: Insufficient documentation

## 2019-07-12 DIAGNOSIS — Z79899 Other long term (current) drug therapy: Secondary | ICD-10-CM | POA: Insufficient documentation

## 2019-07-12 DIAGNOSIS — D649 Anemia, unspecified: Secondary | ICD-10-CM | POA: Diagnosis not present

## 2019-07-12 DIAGNOSIS — Y9301 Activity, walking, marching and hiking: Secondary | ICD-10-CM | POA: Insufficient documentation

## 2019-07-12 DIAGNOSIS — R059 Cough, unspecified: Secondary | ICD-10-CM

## 2019-07-12 LAB — BASIC METABOLIC PANEL
Anion gap: 9 (ref 5–15)
BUN: 13 mg/dL (ref 6–20)
CO2: 21 mmol/L — ABNORMAL LOW (ref 22–32)
Calcium: 8.5 mg/dL — ABNORMAL LOW (ref 8.9–10.3)
Chloride: 106 mmol/L (ref 98–111)
Creatinine, Ser: 0.68 mg/dL (ref 0.44–1.00)
GFR calc Af Amer: >60 mL/min (ref >60)
GFR calc non Af Amer: >60 mL/min (ref >60)
Glucose, Bld: 102 mg/dL — ABNORMAL HIGH (ref 70–99)
Potassium: 3.3 mmol/L — ABNORMAL LOW (ref 3.5–5.1)
Sodium: 136 mmol/L (ref 135–145)

## 2019-07-12 LAB — CBC WITH DIFFERENTIAL/PLATELET
Abs Immature Granulocytes: 0.03 10*3/uL (ref 0.00–0.07)
Basophils Absolute: 0 10*3/uL (ref 0.0–0.1)
Basophils Relative: 0 %
Eosinophils Absolute: 0.2 10*3/uL (ref 0.0–0.5)
Eosinophils Relative: 2 %
HCT: 32.2 % — ABNORMAL LOW (ref 36.0–46.0)
Hemoglobin: 9.9 g/dL — ABNORMAL LOW (ref 12.0–15.0)
Immature Granulocytes: 0 %
Lymphocytes Relative: 17 %
Lymphs Abs: 1.7 10*3/uL (ref 0.7–4.0)
MCH: 31.7 pg (ref 26.0–34.0)
MCHC: 30.7 g/dL (ref 30.0–36.0)
MCV: 103.2 fL — ABNORMAL HIGH (ref 80.0–100.0)
Monocytes Absolute: 0.8 10*3/uL (ref 0.1–1.0)
Monocytes Relative: 8 %
Neutro Abs: 7.4 10*3/uL (ref 1.7–7.7)
Neutrophils Relative %: 73 %
Platelets: 288 10*3/uL (ref 150–400)
RBC: 3.12 MIL/uL — ABNORMAL LOW (ref 3.87–5.11)
RDW: 15.9 % — ABNORMAL HIGH (ref 11.5–15.5)
WBC: 10.1 10*3/uL (ref 4.0–10.5)
nRBC: 0 % (ref 0.0–0.2)

## 2019-07-12 LAB — D-DIMER, QUANTITATIVE: D-Dimer, Quant: 2.59 ug/mL-FEU — ABNORMAL HIGH (ref 0.00–0.50)

## 2019-07-12 LAB — POC SARS CORONAVIRUS 2 AG -  ED: SARS Coronavirus 2 Ag: NEGATIVE

## 2019-07-12 MED ORDER — IOHEXOL 350 MG/ML SOLN
100.0000 mL | Freq: Once | INTRAVENOUS | Status: AC | PRN
  Administered 2019-07-12: 100 mL via INTRAVENOUS

## 2019-07-12 NOTE — Discharge Instructions (Addendum)
As we discussed, your CT scan did not show any evidence of blood clots.  There was mention of some lymphadenopathy that was seen on your previous scans.  This may be due to viral infection that you previously had.  This does need follow-up with referred to pulmonology doctor to ensure that there is no other cause for this.  Follow up with your Primary Care Doctor as needed.   You can take Tylenol or Ibuprofen as directed for pain. You can alternate Tylenol and Ibuprofen every 4 hours. If you take Tylenol at 1pm, then you can take Ibuprofen at 5pm. Then you can take Tylenol again at 9pm. Do not exceed 4000 mg of tylenol a day. Do not exceed 800 mg of ibuprofen a day.     Return to the Emergency Department immediately for any worsening pain, redness/swelling of the ankle, gray or blue color to the toes, numbness/weakness of toes or foot, difficulty walking or any other worsening or concerning symptoms.    Ankle sprain Ankle sprain occurs when the ligaments that hold the ankle joint to get her are stretched or torn. It may take 4-6 weeks to heal.  For activity: Use crutches with nonweightbearing for the first few days. Then, you may walk on your ankles as the pain allows, or as instructed. Start gradually with weight bearing on the affected ankle. Once you can walk pain free, then try jogging. When you can run forwards, then you can try moving side to side. If you cannot walk without crutches in one week, you need a recheck by your Family Doctor.  If you do not have a family doctor to followup with, you can see the list of phone numbers below. Please call today to make a followup appointment.   RICE therapy:  Routine Care for injuries  Rest, Ice, Compression, Elevation (RICE)  Rest is needed to allow your body to heal. Routine activities can be resumed when comfortable. Injury tendons and bones can take up to 6 weeks to heal. Tendons are cordlike structures that attach muscles and bones.  Ice  following an injury helps keep the swelling down and reduce the pain. Put ice in a plastic bag. Place a towel between your skin and the bag of ice. Leave the ice on for 15-20 minutes, 3-4 times a day. Do this while awake, for the first 24-48 hours. After that continue as directed by your caregiver.  Compression helps keep swelling down. It also gives support and helps with discomfort. If any lasting bandage has been applied, it should be removed and reapplied every 3-4 hours. It should not be applied tightly, but firmly enough to keep swelling down. Watch fingers or toes for swelling, discoloration, coldness, numbness or excessive pain. If any of these problems occur, removed the bandage and reapply loosely. Contact your caregiver if these problems continue.  Elevation helps reduce swelling and decrease your pain. With extremities such as the arms, hands, legs and feet, the injured area should be placed near or above the level of the heart if possible.

## 2019-07-12 NOTE — ED Notes (Signed)
Patient ambulated in room lowest o2 saturation level 94%

## 2019-07-12 NOTE — ED Provider Notes (Signed)
[facility] EMERGENCY DEPARTMENT Provider Note   CSN: [id number] Arrival date & time: 07/12/19  1229     History   Chief Complaint Chief Complaint  Patient presents with   Ankle Injury   Cough    HPI Donna Fitzgerald is a 53 y.o. female has known history of arthritis, hypertension who presents for evaluation treatment needs.  She reports that yesterday, she was walking down the steps getting ready to go to work when she fell, twisted her ankle.  She states she was able to work but did report pain with ambulation.  She has been able to bear weight.  She states that since then, is gotten more painful, more swollen, prompting ED visit.  She has not taken any medication for pain.  She denies any numbness/weakness.  Patient also reports that she has had shortness of breath over the last week or so.  She states she was diagnosed with COVID-19 in October 2020.  She has had pneumonia and was hospitalized for 4 days.  She states she had been doing well until about a week ago when she started noticing some progressive worsening dyspnea on exertion.  She denies any chest pain or pain that is worse with deep inspiration.  She states she has had a cough that is productive of brown sputum.  She did notice some small flecks of blood noted in the sputum today but no gross hemoptysis.  She has not noted any fevers.  She denies any abdominal pain, nausea/vomiting.      The history is provided by the patient.    Past Medical History:  Diagnosis Date   Arthritis    Hypertension     Patient Active Problem List   Diagnosis Date Noted   Pneumonia 10/26/2018   Acute respiratory failure with hypoxia (HCC) 10/26/2018   Tobacco use 10/26/2018   Influenza A 10/26/2018   Insomnia 01/19/2013   Low back pain 01/19/2013   Essential hypertension     Past Surgical History:  Procedure Laterality Date   BACK SURGERY     HAND SURGERY Right 1995     OB History    Gravida      Para      Term      Preterm      AB      Living  1     SAB      TAB      Ectopic      Multiple      Live Births               Home Medications    Prior to Admission medications   Medication Sig Start Date End Date Taking? Authorizing Provider  acetaminophen (TYLENOL) 650 MG CR tablet Take 650 mg by mouth every 8 (eight) hours as needed for pain. Takes two    [provider]  albuterol (PROVENTIL HFA;VENTOLIN HFA) 108 (90 Base) MCG/ACT inhaler Inhale 2 puffs into the lungs every 4 (four) hours as needed for wheezing or shortness of breath. 10/29/18   Johnson, Clanford L, MD  amitriptyline (ELAVIL) 100 MG tablet TAKE 1 TABLET (100 MG TOTAL) BY MOUTH AT BEDTIME. Patient taking differently: Take 100 mg by mouth at bedtime.  11/14/13   Allayne Butcher B, PA-C  amLODipine (NORVASC) 10 MG tablet Take 1 tablet (10 mg total) by mouth daily. 01/19/13   Dorena Bodo, PA-C  aspirin 81 MG tablet Take 81 mg by mouth daily.    [provider]  enalapril (VASOTEC) 20 MG tablet TAKE 1 TABLET BY MOUTH EVERY DAY Patient taking differently: Take 20 mg by mouth daily.     Allayne Butcher B, PA-C  gabapentin (NEURONTIN) 300 MG capsule Take 1 capsule (300 mg total) by mouth 2 (two) times daily for 30 days. 10/29/18 11/28/18  Johnson, Clanford L, MD  methocarbamol (ROBAXIN) 500 MG tablet Take 500 mg by mouth 2 (two) times daily after a meal.    [provider]  metoprolol (LOPRESSOR) 50 MG tablet TAKE 1 TABLET BY MOUTH TWICE A DAY Patient taking differently: Take 50 mg by mouth 2 (two) times daily.  11/14/13   Dorena Bodo, PA-C  pravastatin (PRAVACHOL) 80 MG tablet Take 80 mg by mouth daily.    [provider]    Family History Family History  Problem Relation Age of Onset   Hypertension Mother    Heart disease Father    Hypertension Father    Heart disease Sister    Hypertension Sister     Social History Social History   Tobacco Use   Smoking status: Current  Every Day Smoker    Packs/day: 0.50    Years: 30.00    Pack years: 15.00    Types: Cigarettes   Smokeless tobacco: Never Used  Substance Use Topics   Alcohol use: Yes    Comment: 2 beverages per week   Drug use: No     Allergies   Patient has no known allergies.   Review of Systems Review of Systems  Respiratory: Positive for cough and shortness of breath.   Cardiovascular: Negative for chest pain and leg swelling.  Gastrointestinal: Negative for abdominal pain, nausea and vomiting.  Musculoskeletal:       Right ankle pain  Neurological: Negative for weakness and numbness.  All other systems reviewed and are negative.    Physical Exam Updated Vital Signs BP 114/72    Pulse 87    Temp 98.4 F (36.9 C) (Oral)    Resp 18    Ht  (1.575 m)    Wt 88.5 kg    LMP 05/04/2013    SpO2 100%    BMI 35.67 kg/m   Physical Exam Vitals signs and nursing note reviewed.  Constitutional:      Appearance: Normal appearance. She is well-developed.  HENT:     Head: Normocephalic and atraumatic.  Eyes:     General: Lids are normal.     Conjunctiva/sclera: Conjunctivae normal.     Pupils: Pupils are equal, round, and reactive to light.  Neck:     Musculoskeletal: Full passive range of motion without pain.  Cardiovascular:     Rate and Rhythm: Normal rate and regular rhythm.     Pulses: Normal pulses.          Radial pulses are 2+ on the right side and 2+ on the left side.       Dorsalis pedis pulses are 2+ on the right side and 2+ on the left side.     Heart sounds: Normal heart sounds. No murmur. No friction rub. No gallop.   Pulmonary:     Effort: Pulmonary effort is normal.     Breath sounds: Normal breath sounds.     Comments: Lungs clear to auscultation bilaterally.  Symmetric chest rise.  No wheezing, rales, rhonchi. Abdominal:     Palpations: Abdomen is soft. Abdomen is not rigid.     Tenderness: There is no abdominal tenderness. There  is no guarding.    Musculoskeletal: Normal range of motion.     Comments: Tenderness palpation under the medial malleolus of the right ankle with overlying soft tissue swelling.  No deformity or crepitus noted.  No overlying warmth, erythema, edema.  No tenderness palpation noted to proximal tib-fib, distal tib-fib.  No tenderness palpation left lower extremity.  Skin:    General: Skin is warm and dry.     Capillary Refill: Capillary refill takes less than 2 seconds.  Neurological:     Mental Status: She is alert and oriented to person, place, and time.     Comments: Sensation intact along major nerve distributions of BLE  Psychiatric:        Speech: Speech normal.      ED Treatments / Results  Labs (all labs ordered are listed, but only abnormal results are displayed) Labs Reviewed  BASIC METABOLIC PANEL - Abnormal; Notable for the following components:      Result Value   Potassium 3.3 (*)    CO2 21 (*)    Glucose, Bld 102 (*)    Calcium 8.5 (*)    All other components within normal limits  CBC WITH DIFFERENTIAL/PLATELET - Abnormal; Notable for the following components:   RBC 3.12 (*)    Hemoglobin 9.9 (*)    HCT 32.2 (*)    MCV 103.2 (*)    RDW 15.9 (*)    All other components within normal limits  D-DIMER, QUANTITATIVE (NOT AT [facility]) - Abnormal; Notable for the following components:   D-Dimer, Quant 2.59 (*)    All other components within normal limits  POC SARS CORONAVIRUS 2 AG -  ED    EKG None  Radiology Dg Chest 2 View  Result Date: 07/12/2019 CLINICAL DATA:  53 year old female with cough and shortness of breath. EXAM: CHEST - 2 VIEW COMPARISON:  Chest radiograph dated 10/28/2018. FINDINGS: Diffuse interstitial and peribronchial densities as well as hazy airspace densities may be chronic and related to smoking. Developing infiltrate is not excluded. Clinical correlation is recommended. No focal consolidation, pleural effusion, or pneumothorax. The cardiac silhouette is within  normal limits. No acute osseous pathology. IMPRESSION: Chronic bronchitic changes and interstitial coarsening. Developing infiltrate is not excluded. Clinical correlation is recommended. No focal consolidation. Electronically Signed   By: Anner Crete M.D.   On: 07/12/2019 13:52   Dg Ankle Complete Right  Result Date: 07/12/2019 CLINICAL DATA:  Pain and swelling secondary to a twisting injury last night. EXAM: RIGHT ANKLE - COMPLETE 3+ VIEW COMPARISON:  None. FINDINGS: There is no evidence of fracture, dislocation, or joint effusion. Cystic degenerative changes in the navicular. Circumferential soft tissue swelling around the ankle. IMPRESSION: No acute bone abnormality. Soft tissue swelling. Cystic degenerative changes in the navicular. Electronically Signed   By: Lorriane Shire M.D.   On: 07/12/2019 13:51   Ct Angio Chest Pe W And/or Wo Contrast  Result Date: 07/12/2019 CLINICAL DATA:  Recent COVID-19 with positive D-dimer EXAM: CT ANGIOGRAPHY CHEST WITH CONTRAST TECHNIQUE: Multidetector CT imaging of the chest was performed using the standard protocol during bolus administration of intravenous contrast. Multiplanar CT image reconstructions and MIPs were obtained to evaluate the vascular anatomy. CONTRAST:  142mL OMNIPAQUE IOHEXOL 350 MG/ML SOLN COMPARISON:  October 26, 2018 FINDINGS: Cardiovascular: There is no demonstrable pulmonary embolus. There is no appreciable thoracic aortic aneurysm or dissection. Visualized great vessels appear unremarkable. There is no pericardial effusion or pericardial thickening. There are scattered foci of coronary artery  calcification. Mediastinum/Nodes: Thyroid appears unremarkable. There is a lymph node anterior to the carina measuring 2.2 x 2.0 cm, larger than on previous study. There is a lymph node to the left of the aortic arch measuring 2.4 x 1.1 cm. There is a pretracheal lymph node just to the right of midline measuring 1.5 x 1.5 cm, slightly larger than on  previous study. There is a right hilar lymph node measuring 1.7 x 1.5 cm, similar to previous study. A second right hilar lymph node measures 1.1 x 1.1 cm. There is an aortopulmonary window lymph node measuring 1.7 x 1.6 cm. There are several prominent subcarinal lymph nodes. There is a lymph node in the immediate subcarinal region measuring 1.9 x 1.8 cm. More inferiorly and toward the right, there is a 1.9 x 1.9 cm lymph node. Overall, lymph nodes are slightly larger than on the prior study. There is no esophageal lesion evident. Lungs/Pleura: There are scattered areas of atelectatic change. There is mosaic attenuation at several sites. There is currently no well-defined airspace consolidation or appreciable ground-glass type opacity. No pleural effusions are evident. The right upper lobe nodular opacity seen previously are no longer evident. Upper Abdomen: There is mild reflux of contrast into the inferior vena cava and hepatic veins. There is hepatic steatosis. Visualized upper abdominal structures otherwise appear unremarkable. Musculoskeletal: There are no blastic or lytic bone lesions. No chest wall lesions are evident. Review of the MIP images confirms the above findings. IMPRESSION: 1. No demonstrable pulmonary embolus. No thoracic aortic aneurysm or dissection. Scattered foci of coronary artery calcification noted. 2. Multifocal adenopathy, slightly increased from prior study. Etiology for adenopathy uncertain. Neoplastic etiology must be of concern. 3. Areas of atelectatic change. Mosaic appearance at several levels most likely represents a degree of small airways obstructive disease. No consolidation or ground-glass type opacity evident. No pleural effusions appreciable. 4. Mild reflux of contrast into the inferior vena cava and hepatic veins may be indicative of a degree of increase in right heart pressure. 5.  Hepatic steatosis. Electronically Signed   By: Bretta BangWilliam  Woodruff III M.D.   On: 07/12/2019  19:31    Procedures Procedures (including critical care time)  Medications Ordered in ED Medications  iohexol (OMNIPAQUE) 350 MG/ML injection 100 mL (100 mLs Intravenous Contrast Given 07/12/19 1859)     Initial Impression / Assessment and Plan / ED Course  I have reviewed the triage vital signs and the nursing notes.  Pertinent labs & imaging results that were available during my care of the patient were reviewed by me and considered in my medical decision making (see chart for details).        53 year old female who presents for evaluation of 2 complaints.  Reports yesterday, she tripped and rolled her ankle.  Since then has had pain and swelling.  Also reports about a 1 week history of progressively worsening shortness of breath that is worse with exertion.  Recent diagnosis and hospitalization after COVID-19 with pneumonia.  No chest pain.  Also reports productive cough but no fevers.  Initial arrival, she is afebrile, nontoxic-appearing.  She is slightly tachycardic.  Vitals otherwise stable.  Lungs clear to auscultation with no evidence of respiratory distress.  She is tenderness palpation noted to the medial malleolus of the right ankle with overlying soft tissue swelling.  Consider sprain versus dislocation versus fracture.  Plan for x-rays, lab work, chest x-ray.  Regarding her shortness, cough, consider if this is continued symptoms from COVID-19 versus viral  infectious process.  Low suspicion for PE but given recent Covid diagnosis as well as the fact that she is tachycardic, will plan for dimer for low risk evaluation of PE.  Ankle x-ray reviewed.  Negative for any acute bony abnormality.  We will plan to treat as sprain with ASO splint and crutches.  Chest x-ray shows chronic bronchitic changes interstitial coarsening.  No obvious focal consolidation.  Dimer is elevated 2.59.  BMP shows potassium 3.3.  CBC shows no leukocytosis.  Hemoglobin is 9.9.  This is slightly lower than  her norm.  Given elevation in D-dimer, will plan for CTA of chest.  CTA of chest shows no evidence of PE.  She has multifocal adenopathy, slightly increased from prior study.  Etiology is unclear.  She has some areas of atelectatic change but no evidence of consolidation or groundglass opacity seen.  Discussed results with patient.  I instructed patient that she will need to follow-up with pulmonology regarding the adenopathy seen in her chest CT.  Patient ambulated in the ED with sats of 97% on room air.  Patient given ASO splint and crutches for ankle sprain.  Vital signs are stable. At this time, patient exhibits no emergent life-threatening condition that require further evaluation in ED or admission. Patient had ample opportunity for questions and discussion. All patient's questions were answered with full understanding. Strict return precautions discussed. Patient expresses understanding and agreement to plan.    Portions of this note were generated with Scientist, clinical (histocompatibility and immunogenetics). Dictation errors may occur despite best attempts at proofreading.   Final Clinical Impressions(s) / ED Diagnoses   Final diagnoses:  Sprain of right ankle, unspecified ligament, initial encounter  Cough  Lymphadenopathy  Anemia, unspecified type    ED Discharge Orders    None       Rosana Hoes 07/12/19 2253    Glynn Octave, MD 07/13/19 0120

## 2019-07-12 NOTE — ED Triage Notes (Signed)
PT states she missed a step coming out of her house yesterday evening and rolled her right ankle. PT c/o pain with ambulation and swelling to right ankle noted. PT able to bear weight. PT states she had Covid 05/27/2019 and was treated for pneumonia then but this past week her cough and some SOB with exertion has started back again.

## 2019-08-25 ENCOUNTER — Encounter: Payer: Self-pay | Admitting: Pulmonary Disease

## 2019-08-25 ENCOUNTER — Other Ambulatory Visit (INDEPENDENT_AMBULATORY_CARE_PROVIDER_SITE_OTHER): Payer: Commercial Managed Care - PPO

## 2019-08-25 ENCOUNTER — Other Ambulatory Visit: Payer: Self-pay | Admitting: Pulmonary Disease

## 2019-08-25 ENCOUNTER — Other Ambulatory Visit: Payer: Self-pay

## 2019-08-25 ENCOUNTER — Ambulatory Visit (INDEPENDENT_AMBULATORY_CARE_PROVIDER_SITE_OTHER): Payer: Commercial Managed Care - PPO | Admitting: Pulmonary Disease

## 2019-08-25 VITALS — BP 122/80 | HR 82 | Temp 97.7°F | Ht 62.0 in | Wt 184.4 lb

## 2019-08-25 DIAGNOSIS — R590 Localized enlarged lymph nodes: Secondary | ICD-10-CM | POA: Diagnosis not present

## 2019-08-25 DIAGNOSIS — R0600 Dyspnea, unspecified: Secondary | ICD-10-CM

## 2019-08-25 DIAGNOSIS — Z720 Tobacco use: Secondary | ICD-10-CM

## 2019-08-25 DIAGNOSIS — F17210 Nicotine dependence, cigarettes, uncomplicated: Secondary | ICD-10-CM

## 2019-08-25 LAB — PROTIME-INR
INR: 1.2 ratio — ABNORMAL HIGH (ref 0.8–1.0)
Prothrombin Time: 13.7 s — ABNORMAL HIGH (ref 9.6–13.1)

## 2019-08-25 LAB — CBC WITH DIFFERENTIAL/PLATELET
Basophils Absolute: 0.1 10*3/uL (ref 0.0–0.1)
Basophils Relative: 0.7 % (ref 0.0–3.0)
Eosinophils Absolute: 0.2 10*3/uL (ref 0.0–0.7)
Eosinophils Relative: 2.4 % (ref 0.0–5.0)
HCT: 37.6 % (ref 36.0–46.0)
Hemoglobin: 12.4 g/dL (ref 12.0–15.0)
Lymphocytes Relative: 32.1 % (ref 12.0–46.0)
Lymphs Abs: 2.3 10*3/uL (ref 0.7–4.0)
MCHC: 33 g/dL (ref 30.0–36.0)
MCV: 91.3 fl (ref 78.0–100.0)
Monocytes Absolute: 0.5 10*3/uL (ref 0.1–1.0)
Monocytes Relative: 7.6 % (ref 3.0–12.0)
Neutro Abs: 4.1 10*3/uL (ref 1.4–7.7)
Neutrophils Relative %: 57.2 % (ref 43.0–77.0)
Platelets: 394 10*3/uL (ref 150.0–400.0)
RBC: 4.12 Mil/uL (ref 3.87–5.11)
RDW: 18 % — ABNORMAL HIGH (ref 11.5–15.5)
WBC: 7.1 10*3/uL (ref 4.0–10.5)

## 2019-08-25 MED ORDER — STIOLTO RESPIMAT 2.5-2.5 MCG/ACT IN AERS: 2.0000 | INHALATION_SPRAY | Freq: Every day | RESPIRATORY_TRACT | 0 refills | Status: DC

## 2019-08-25 MED ORDER — CHANTIX STARTING MONTH PAK 0.5 MG X 11 & 1 MG X 42 PO TABS: ORAL_TABLET | ORAL | 0 refills | Status: DC

## 2019-08-25 MED ORDER — STIOLTO RESPIMAT 2.5-2.5 MCG/ACT IN AERS: 2.0000 | INHALATION_SPRAY | Freq: Every day | RESPIRATORY_TRACT | 3 refills | Status: DC

## 2019-08-25 MED ORDER — NICOTINE 14 MG/24HR TD PT24: 14.0000 mg | MEDICATED_PATCH | Freq: Every day | TRANSDERMAL | 0 refills | Status: DC

## 2019-08-25 NOTE — Progress Notes (Signed)
Donna Fitzgerald    [id number]    [birth year]  Primary Care Physician:[facility], [place]  Referring Physician: [facility], [facility] [address] [place],  [place] [address]  Chief complaint:  Consult for dyspnea, abnormal CT  HPI: 54 year old smoker with history of hypertension, arthritis presenting dyspnea for the past 2 months.  She has chronic shortness of breath with rest and exertion associated with cough, white mucus.  No wheezing.  She works as a Quarry manager at [employer] and was diagnosed with COVID-19 pneumonia in October 2020 at her place of work.  She stayed at home and self isolated for 14 days and did not require admission. She had a ED visit on 07/12/2023 ankle pain and dyspnea.  Evaluated for PE with a negative CTA.  There is no evidence of interstitial lung disease or pulmonary fibrosis on that CT scan.  Pets: Dog, no cats, birds, farm Occupation: CNA at a nursing home Exposures: No known exposures.  No mold, hot tub, Jacuzzi, no feather pillows or comforter Smoking history: 17-pack-year smoker.  Continues to smoke half pack per day Travel history: No significant travel Relevant family history: No significant family history of lung disease.  Outpatient Encounter Medications as of 08/25/2019  Medication Sig  . albuterol (PROVENTIL HFA;VENTOLIN HFA) 108 (90 Base) MCG/ACT inhaler Inhale 2 puffs into the lungs every 4 (four) hours as needed for wheezing or shortness of breath.  Marland Kitchen amitriptyline (ELAVIL) 100 MG tablet TAKE 1 TABLET (100 MG TOTAL) BY MOUTH AT BEDTIME. (Patient taking differently: Take 100 mg by mouth at bedtime. )  . amLODipine (NORVASC) 10 MG tablet Take 1 tablet (10 mg total) by mouth daily.  Marland Kitchen aspirin 81 MG tablet Take 81 mg by mouth daily.  . enalapril (VASOTEC) 20 MG tablet TAKE 1 TABLET BY MOUTH EVERY DAY (Patient taking differently: Take 20 mg by mouth daily. )  . gabapentin (NEURONTIN) 300 MG capsule Take 1  capsule (300 mg total) by mouth 2 (two) times daily for 30 days.  Marland Kitchen ibuprofen (ADVIL) 600 MG tablet Take 600 mg by mouth every 6 (six) hours as needed.   . methocarbamol (ROBAXIN) 500 MG tablet Take 500 mg by mouth 2 (two) times daily after a meal.  . metoprolol (LOPRESSOR) 50 MG tablet TAKE 1 TABLET BY MOUTH TWICE A DAY (Patient taking differently: Take 50 mg by mouth 2 (two) times daily. )  . pravastatin (PRAVACHOL) 80 MG tablet Take 80 mg by mouth daily.  . [DISCONTINUED] acetaminophen (TYLENOL) 650 MG CR tablet Take 650 mg by mouth every 8 (eight) hours as needed for pain. Takes two   No facility-administered encounter medications on file as of 08/25/2019.    Allergies as of 08/25/2019  . (No Known Allergies)    Past Medical History:  Diagnosis Date  . Arthritis   . Hypertension     Past Surgical History:  Procedure Laterality Date  . BACK SURGERY    . HAND SURGERY Right 1995    Family History  Problem Relation Age of Onset  . Hypertension Mother   . Heart disease Father   . Hypertension Father   . Heart disease Sister   . Hypertension Sister     Social History   Socioeconomic History  . Marital status: Divorced    Spouse name: Not on file  . Number of children: Not on file  . Years of education: Not on file  .  Highest education level: Not on file  Occupational History  . Not on file  Tobacco Use  . Smoking status: Current Every Day Smoker    Packs/day: 0.50    Years: 30.00    Pack years: 15.00    Types: Cigarettes  . Smokeless tobacco: Never Used  Substance and Sexual Activity  . Alcohol use: Yes    Comment: 2 beverages per week  . Drug use: No  . Sexual activity: Not Currently  Other Topics Concern  . Not on file  Social History Narrative  . Not on file   Social Determinants of Health   Financial Resource Strain: Unknown  . Difficulty of Paying Living Expenses: Patient refused  Food Insecurity: Unknown  . Worried About Programme researcher, broadcasting/film/video in the  Last Year: Patient refused  . Ran Out of Food in the Last Year: Patient refused  Transportation Needs: Unknown  . Lack of Transportation (Medical): Patient refused  . Lack of Transportation (Non-Medical): Patient refused  Physical Activity: Unknown  . Days of Exercise per Week: Patient refused  . Minutes of Exercise per Session: Patient refused  Stress: Unknown  . Feeling of Stress : Patient refused  Social Connections: Unknown  . Frequency of Communication with Friends and Family: Patient refused  . Frequency of Social Gatherings with Friends and Family: Patient refused  . Attends Religious Services: Patient refused  . Active Member of Clubs or Organizations: Patient refused  . Attends Banker Meetings: Patient refused  . Marital Status: Patient refused  Intimate Partner Violence: Unknown  . Fear of Current or Ex-Partner: Patient refused  . Emotionally Abused: Patient refused  . Physically Abused: Patient refused  . Sexually Abused: Patient refused    Review of systems: Review of Systems  Constitutional: Negative for fever and chills.  HENT: Negative.   Eyes: Negative for blurred vision.  Respiratory: as per HPI  Cardiovascular: Negative for chest pain and palpitations.  Gastrointestinal: Negative for vomiting, diarrhea, blood per rectum. Genitourinary: Negative for dysuria, urgency, frequency and hematuria.  Musculoskeletal: Negative for myalgias, back pain and joint pain.  Skin: Negative for itching and rash.  Neurological: Negative for dizziness, tremors, focal weakness, seizures and loss of consciousness.  Endo/Heme/Allergies: Negative for environmental allergies.  Psychiatric/Behavioral: Negative for depression, suicidal ideas and hallucinations.  All other systems reviewed and are negative.  Physical Exam: Blood pressure 122/80, pulse 82, temperature 97.7 F (36.5 C), temperature source Temporal, height 5\' 2"  (1.575 m), weight 184 lb 6.4 oz (83.6 kg),  last menstrual period 05/04/2013, SpO2 100 %. Gen:      No acute distress HEENT:  EOMI, sclera anicteric Neck:     No masses; no thyromegaly Lungs:    Mild wheezing, rhonchi CV:         Regular rate and rhythm; no murmurs Abd:      + bowel sounds; soft, non-tender; no palpable masses, no distension Ext:    No edema; adequate peripheral perfusion Skin:      Warm and dry; no rash Neuro: alert and oriented x 3 Psych: normal mood and affect  Data Reviewed: Imaging: CTA 05/12/2019-no PE, mediastinal and hilar lymphadenopathy.  Slightly enlarged compared to previous CT scan in March 2020.  Mild air trapping with left basis I have reviewed all images  Labs: CBC 07/12/2019-WBC 10.1, eos 2%, absolute eosinophil count 200  Assessment:  Dyspnea Suspect she has COPD, chronic bronchitis from smoking with wheezing on examination Started on LABA/LAMA inhaler with Stiolto.  She will not need inhaled corticosteroid as peripheral eosinophils are low Check CBC differential, IgE, alpha-1 antitrypsin and schedule pulmonary function test  Abnormal CT There is no evidence of post Covid pneumonitis or pulmonary fibrosis Of concern is persistent lymphadenopathy which is increased slightly from early 2020. Malignancy is a concern as she is a smoker though there is no discrete lung mass. We will schedule PET scan.  If positive then she will need an endobronchial ultrasound with lymph node biopsy  Active smoker Smoking cessation with patient.  She is ready to quit.  Will prescribe nicotine patches and Chantix.  Reassess at return visit Time spent counseling-5 mins  Plan/Recommendations: - Stiolto - Check CBC, IgE, alpha-1 antitrypsin, PFTs - PET scan - Smoking cessation with Chantix, nicotine patches  This appointment required 45 minutes of patient care (this includes precharting, chart review, review of results, face-to-face care, etc.).  Chilton Greathouse MD [facility] 08/25/2019, 8:37 AM  CC: [facility]*

## 2019-08-25 NOTE — Patient Instructions (Signed)
We will start you on an inhaler called stiolto We will schedule you for pulmonary function test Check CBC differential, IgE, alpha-1 antitrypsin levels and phenotype Schedule you for pulmonary function test  We need to work on smoking cessation. We will prescribe nicotine patches and Chantix  Follow-up in 3 months.

## 2019-08-29 LAB — ALPHA-1 ANTITRYPSIN PHENOTYPE: A-1 Antitrypsin, Ser: 157 mg/dL (ref 83–199)

## 2019-08-29 LAB — IGE: IgE (Immunoglobulin E), Serum: 29 kU/L (ref <=114)

## 2019-09-02 ENCOUNTER — Encounter (HOSPITAL_COMMUNITY): Payer: Commercial Managed Care - PPO

## 2019-09-07 ENCOUNTER — Other Ambulatory Visit: Payer: Self-pay

## 2019-09-07 ENCOUNTER — Ambulatory Visit (HOSPITAL_COMMUNITY)
Admission: RE | Admit: 2019-09-07 | Discharge: 2019-09-07 | Disposition: A | Discharge status: Home / Self Care | Payer: Commercial Managed Care - PPO | Source: Ambulatory Visit | Attending: Pulmonary Disease | Admitting: Pulmonary Disease

## 2019-09-07 DIAGNOSIS — R590 Localized enlarged lymph nodes: Secondary | ICD-10-CM

## 2019-09-07 LAB — GLUCOSE, CAPILLARY: Glucose-Capillary: 92 mg/dL (ref 70–99)

## 2019-09-07 MED ORDER — FLUDEOXYGLUCOSE F - 18 (FDG) INJECTION
9.0000 | Freq: Once | INTRAVENOUS | Status: AC | PRN
  Administered 2019-09-07: 9 via INTRAVENOUS

## 2019-09-09 ENCOUNTER — Telehealth: Payer: Self-pay | Admitting: Pulmonary Disease

## 2019-09-09 DIAGNOSIS — R590 Localized enlarged lymph nodes: Secondary | ICD-10-CM

## 2019-09-09 NOTE — Telephone Encounter (Signed)
Dr. Isaiah Serge please advise if any labs are needed on this biopsy: several lab options: CSF cell count, CSF culture, gram stain, protein and glucose csf, anaerobic culture, culture fungus w/o smear, VDRL CSF, oligo clonal bands, cytology-non pap, sx pathology, other.  I have pended order in this encounter until labs are verified by Dr. Isaiah Serge.  Thanks!

## 2019-09-09 NOTE — Telephone Encounter (Signed)
I called to discuss PET scan with patient which shows persistent enlarged lymph nodes with uptake Please order interventional radiology ultrasound biopsy of the right axillary lymph node.

## 2019-09-09 NOTE — Telephone Encounter (Signed)
ATC pt, no answer. Left message for pt to call back.  Dr. Isaiah Serge, please review PET results.

## 2019-09-10 NOTE — Telephone Encounter (Signed)
I added the orders needed. Thanks

## 2019-09-12 ENCOUNTER — Encounter (HOSPITAL_COMMUNITY): Payer: Self-pay | Admitting: Radiology

## 2019-09-12 NOTE — Progress Notes (Signed)
Donna Fitzgerald. Paul[place] Female, 54 y.o., 1966-05-07 MRN:  [id number] Phone:  [phone] (H) ... PCP:  [facility] Health Coverage:  [place] [facility] Ppo Next Appt With Radiology ([facility]-US 2) 09/21/2019 at 1:00 PM  RE: [place] Core Biopsy (Lymph Nodes) Received: Today Message Contents  Donna Balm, MD  Donna Fitzgerald  Ok   [place] core R ax LAN  R/o lymphoma also cultures etc as below   DDH       Previous Messages   ----- Message -----  From: Donna Fitzgerald  Sent: 09/12/2019  8:27 AM EST  To: Ir Procedure Requests  Subject: [place] Core Biopsy (Lymph Nodes)           Procedure:  [place] Core Biopsy (Lymph Nodes)   Reason:  Localized enlarged lymph nodes, persistent enlarged lymph nodes with uptake on PET,  Please add flow cytometry, regular cultures, AFB and fungal cultures to lymph node biopsy   History:  CT, NM PET in computer   Provider: Chilton Fitzgerald   Provider Contact: [phone]

## 2019-09-20 ENCOUNTER — Other Ambulatory Visit: Payer: Self-pay | Admitting: Radiology

## 2019-09-21 ENCOUNTER — Ambulatory Visit (HOSPITAL_COMMUNITY)
Admission: RE | Admit: 2019-09-21 | Discharge: 2019-09-21 | Disposition: A | Discharge status: Home / Self Care | Payer: Commercial Managed Care - PPO | Source: Ambulatory Visit | Attending: Pulmonary Disease | Admitting: Pulmonary Disease

## 2019-09-21 ENCOUNTER — Other Ambulatory Visit: Payer: Self-pay

## 2019-09-21 DIAGNOSIS — R590 Localized enlarged lymph nodes: Secondary | ICD-10-CM | POA: Diagnosis present

## 2019-09-21 LAB — CBC
HCT: 34.6 % — ABNORMAL LOW (ref 36.0–46.0)
Hemoglobin: 10.7 g/dL — ABNORMAL LOW (ref 12.0–15.0)
MCH: 29.2 pg (ref 26.0–34.0)
MCHC: 30.9 g/dL (ref 30.0–36.0)
MCV: 94.5 fL (ref 80.0–100.0)
Platelets: 383 10*3/uL (ref 150–400)
RBC: 3.66 MIL/uL — ABNORMAL LOW (ref 3.87–5.11)
RDW: 16.9 % — ABNORMAL HIGH (ref 11.5–15.5)
WBC: 8.7 10*3/uL (ref 4.0–10.5)
nRBC: 0 % (ref 0.0–0.2)

## 2019-09-21 MED ORDER — MIDAZOLAM HCL 2 MG/2ML IJ SOLN
INTRAMUSCULAR | Status: AC
  Filled 2019-09-21: qty 2

## 2019-09-21 MED ORDER — MIDAZOLAM HCL 2 MG/2ML IJ SOLN
INTRAMUSCULAR | Status: AC | PRN
  Administered 2019-09-21: 1 mg via INTRAVENOUS

## 2019-09-21 MED ORDER — FENTANYL CITRATE (PF) 100 MCG/2ML IJ SOLN
INTRAMUSCULAR | Status: AC
  Filled 2019-09-21: qty 2

## 2019-09-21 MED ORDER — FENTANYL CITRATE (PF) 100 MCG/2ML IJ SOLN
INTRAMUSCULAR | Status: AC | PRN
  Administered 2019-09-21: 25 ug via INTRAVENOUS

## 2019-09-21 MED ORDER — LIDOCAINE-EPINEPHRINE 1 %-1:100000 IJ SOLN
INTRAMUSCULAR | Status: AC
  Filled 2019-09-21: qty 1

## 2019-09-21 MED ORDER — SODIUM CHLORIDE 0.9 % IV SOLN
INTRAVENOUS | Status: AC | PRN
  Administered 2019-09-21: 10 mL/h via INTRAVENOUS

## 2019-09-21 MED ORDER — SODIUM CHLORIDE 0.9 % IV SOLN: INTRAVENOUS | Status: DC

## 2019-09-21 NOTE — Discharge Instructions (Addendum)

## 2019-09-21 NOTE — Procedures (Signed)
Pre Procedure Dx: Right axillary lymphadenopathy Post Procedural Dx: Same  Technically successful US guided biopsy of dominant indeterminate right axillary lymph node.  EBL: None No immediate complications.   Katherina Right, MD Pager #: [phone]

## 2019-09-21 NOTE — H&P (Addendum)
Chief Complaint: Patient was seen in consultation today for lymphadenopathy  Referring Physician(s): Mannam,Praveen  Supervising Physician: Sandi Mariscal  Patient Status: [facility] - Out-pt  History of Present Illness: Donna Fitzgerald is a 54 y.o. female with past medical history of arthritis, HTN who presented to the ED 07/12/19 with ankle pain after a fall at home.  During evaluation for her ankle sprain, she also complained of shortness of breath worsening after recent recovery from COVID infection.  Patient underwent CTA Chest which showed multifocal adenopathy slightly increased from previous study (CT Chest 11/05/18 with diagnosis of multifocal pneumonia).  She was referred to Dr. Vaughan Browner for follow-up of her abnormal CT and COVID pneumonia who recommended PET scan.   PET 09/07/19: 1. Moderate increased FDG uptake localizes to persistent mediastinal adenopathy. There is also moderate uptake localizing borderline right axillary lymph nodes and right hilar lymph node. Single enlarged upper abdominal lymph node has mild FDG uptake. Findings are nonspecific. Morphologically lymph nodes are relatively stable in size when compared with study from 10/26/2018. Primary differential considerations include granulomatous inflammation/infection (sarcoid not excluded), lymphoproliferative disorder, or metastatic adenopathy. Correlation with tissue sampling is advised. If there are clinical findings suggestive of sarcoid consider further evaluation of the pulmonary parenchyma with high-resolution CT of the chest. 2. Nonspecific increased radiotracer uptake within the distal esophagus. Although this may be seen with esophageal neoplasm, esophagitis may have a similar appearance. Clinical correlation advised.  Now presents to [facility] for lymph node biopsy.  Case reviewed and approved by Dr. Vernard Gambles.   She presents today in her usual state of health. Denies fever, cough, chills, shortness of  breath, nausea, vomiting, dysuria.  She has been NPO.  She does not take blood thinners.   Past Medical History:  Diagnosis Date  . Arthritis   . Hypertension     Past Surgical History:  Procedure Laterality Date  . BACK SURGERY    . HAND SURGERY Right 1995    Allergies: Patient has no known allergies.  Medications: Prior to Admission medications   Medication Sig Start Date End Date Taking? Authorizing Provider  albuterol (PROVENTIL HFA;VENTOLIN HFA) 108 (90 Base) MCG/ACT inhaler Inhale 2 puffs into the lungs every 4 (four) hours as needed for wheezing or shortness of breath. 10/29/18  Yes Johnson, Clanford L, MD  amitriptyline (ELAVIL) 100 MG tablet TAKE 1 TABLET (100 MG TOTAL) BY MOUTH AT BEDTIME. Patient taking differently: Take 100 mg by mouth at bedtime.  11/14/13  Yes Dena Billet B, PA-C  amLODipine (NORVASC) 10 MG tablet Take 1 tablet (10 mg total) by mouth daily. 01/19/13  Yes Orlena Sheldon, PA-C  aspirin 81 MG tablet Take 81 mg by mouth daily.   Yes [provider]  enalapril (VASOTEC) 20 MG tablet TAKE 1 TABLET BY MOUTH EVERY DAY Patient taking differently: Take 20 mg by mouth daily.    Yes Dena Billet B, PA-C  gabapentin (NEURONTIN) 300 MG capsule Take 1 capsule (300 mg total) by mouth 2 (two) times daily for 30 days. 10/29/18 09/21/19 Yes Johnson, Clanford L, MD  ibuprofen (ADVIL) 600 MG tablet Take 600 mg by mouth every 6 (six) hours as needed.  08/03/19  Yes [provider]  methocarbamol (ROBAXIN) 500 MG tablet Take 500 mg by mouth 2 (two) times daily after a meal.   Yes [provider]  metoprolol (LOPRESSOR) 50 MG tablet TAKE 1 TABLET BY MOUTH TWICE A DAY Patient taking differently: Take 50 mg by mouth 2 (  two) times daily.  11/14/13  Yes Allayne Butcher B, PA-C  pravastatin (PRAVACHOL) 80 MG tablet Take 80 mg by mouth daily.   Yes [provider]  Tiotropium Bromide-Olodaterol (STIOLTO RESPIMAT) 2.5-2.5 MCG/ACT AERS Inhale 2 puffs into the  lungs daily. 08/25/19  Yes Mannam, Praveen, MD  nicotine (NICODERM CQ - DOSED IN MG/24 HOURS) 14 mg/24hr patch Place 1 patch (14 mg total) onto the skin daily. 08/25/19   Mannam, Colbert Coyer, MD  Tiotropium Bromide-Olodaterol (STIOLTO RESPIMAT) 2.5-2.5 MCG/ACT AERS Inhale 2 puffs into the lungs daily. 08/25/19   Mannam, Colbert Coyer, MD  varenicline (CHANTIX STARTING MONTH PAK) 0.5 MG X 11 & 1 MG X 42 tablet Take one 0.5 mg tablet by mouth once daily for 3 days, then increase to one 0.5 mg tablet twice daily for 4 days, then increase to one 1 mg tablet twice daily. 08/25/19   Chilton Greathouse, MD     Family History  Problem Relation Age of Onset  . Hypertension Mother   . Heart disease Father   . Hypertension Father   . Heart disease Sister   . Hypertension Sister     Social History   Socioeconomic History  . Marital status: Divorced    Spouse name: Not on file  . Number of children: Not on file  . Years of education: Not on file  . Highest education level: Not on file  Occupational History  . Not on file  Tobacco Use  . Smoking status: Current Every Day Smoker    Packs/day: 0.50    Years: 30.00    Pack years: 15.00    Types: Cigarettes  . Smokeless tobacco: Never Used  Substance and Sexual Activity  . Alcohol use: Yes    Comment: 2 beverages per week  . Drug use: No  . Sexual activity: Not Currently  Other Topics Concern  . Not on file  Social History Narrative  . Not on file   Social Determinants of Health   Financial Resource Strain: Unknown  . Difficulty of Paying Living Expenses: Patient refused  Food Insecurity: Unknown  . Worried About Programme researcher, broadcasting/film/video in the Last Year: Patient refused  . Ran Out of Food in the Last Year: Patient refused  Transportation Needs: Unknown  . Lack of Transportation (Medical): Patient refused  . Lack of Transportation (Non-Medical): Patient refused  Physical Activity: Unknown  . Days of Exercise per Week: Patient refused  . Minutes of Exercise  per Session: Patient refused  Stress: Unknown  . Feeling of Stress : Patient refused  Social Connections: Unknown  . Frequency of Communication with Friends and Family: Patient refused  . Frequency of Social Gatherings with Friends and Family: Patient refused  . Attends Religious Services: Patient refused  . Active Member of Clubs or Organizations: Patient refused  . Attends Banker Meetings: Patient refused  . Marital Status: Patient refused     Review of Systems: A 12 point ROS discussed and pertinent positives are indicated in the HPI above.  All other systems are negative.  Review of Systems  Constitutional: Negative for fatigue and fever.  Respiratory: Negative for cough and shortness of breath.   Cardiovascular: Negative for chest pain.  Gastrointestinal: Negative for abdominal pain.  Musculoskeletal: Negative for back pain.  Psychiatric/Behavioral: Negative for behavioral problems and confusion.    Vital Signs: BP 132/83   Pulse (!) 108   Temp 98.1 F (36.7 C) (Oral)   Resp 16   Ht 5\' 2"  (  1.575 m)   Wt 185 lb (83.9 kg)   LMP 05/04/2013   SpO2 94%   BMI 33.84 kg/m   Physical Exam Vitals and nursing note reviewed.  Constitutional:      General: She is not in acute distress.    Appearance: Normal appearance. She is not ill-appearing.  HENT:     Mouth/Throat:     Mouth: Mucous membranes are moist.     Pharynx: Oropharynx is clear.  Cardiovascular:     Rate and Rhythm: Normal rate and regular rhythm.  Pulmonary:     Effort: Pulmonary effort is normal.     Breath sounds: Normal breath sounds.  Abdominal:     General: Abdomen is flat. There is no distension.     Palpations: Abdomen is soft.  Musculoskeletal:     Cervical back: Normal range of motion and neck supple.     Comments: No palpable nodes in the right axilla  Skin:    General: Skin is warm and dry.  Neurological:     General: No focal deficit present.     Mental Status: She is alert  and oriented to person, place, and time. Mental status is at baseline.  Psychiatric:        Mood and Affect: Mood normal.        Behavior: Behavior normal.        Thought Content: Thought content normal.        Judgment: Judgment normal.      MD Evaluation Airway: WNL Heart: WNL Abdomen: WNL Chest/ Lungs: WNL ASA  Classification: 2 Mallampati/Airway Score: Two   Imaging: NM PET Image Initial (PI) Skull Base To Thigh  Result Date: 09/07/2019 CLINICAL DATA:  Initial  treatment strategy for adenopathy. EXAM: NUCLEAR MEDICINE PET SKULL BASE TO THIGH TECHNIQUE: 9.0 mCi F-18 FDG was injected intravenously. Full-ring PET imaging was performed from the skull base to thigh after the radiotracer. CT data was obtained and used for attenuation correction and anatomic localization. Fasting blood glucose: 92 mg/dl COMPARISON:  CT chest 24/23/5361. FINDINGS: Mediastinal blood pool activity: SUV max 2.7 Liver activity: SUV max NA NECK: No hypermetabolic lymph nodes in the neck. Incidental CT findings: none CHEST: Enlarged and FDG avid mediastinal lymph nodes identified. -pre-vascular lymph node measures 1.2 cm and has an SUV max of 5.36. Right paratracheal lymph node measures 1.5 cm and has SUV max of 5.48. 1.9 cm subcarinal lymph node has SUV max of 4.7. -increased uptake within the right hilar region has an SUV max of 4.08. -Within the right axilla there are several prominent lymph nodes with mildly increased FDG uptake. Index node measures 0.9 cm short axis with SUV max of 5.28. No pleural effusion, airspace consolidation or atelectasis. No FDG avid pulmonary nodule or mass. Increased uptake within the distal esophagus is nonspecific within SUV max of 5.32. Incidental CT findings: Mild aortic atherosclerosis. Lad, left circumflex coronary artery calcifications. ABDOMEN/PELVIS: No abnormal hypermetabolic activity within the liver, pancreas, adrenal glands, or spleen. 1.7 cm peripancreatic lymph node is  identified with SUV max of 2.68. Incidental CT findings: Aortic atherosclerosis.  No aneurysm. SKELETON: No focal hypermetabolic activity to suggest skeletal metastasis. Incidental CT findings: none IMPRESSION: 1. Moderate increased FDG uptake localizes to persistent mediastinal adenopathy. There is also moderate uptake localizing borderline right axillary lymph nodes and right hilar lymph node. Single enlarged upper abdominal lymph node has mild FDG uptake. Findings are nonspecific. Morphologically lymph nodes are relatively stable in size when compared with study  from 10/26/2018. Primary differential considerations include granulomatous inflammation/infection (sarcoid not excluded), lymphoproliferative disorder, or metastatic adenopathy. Correlation with tissue sampling is advised. If there are clinical findings suggestive of sarcoid consider further evaluation of the pulmonary parenchyma with high-resolution CT of the chest. 2. Nonspecific increased radiotracer uptake within the distal esophagus. Although this may be seen with esophageal neoplasm, esophagitis may have a similar appearance. Clinical correlation advised. 3.  Aortic Atherosclerosis (ICD10-I70.0). Electronically Signed   By: Signa Kell M.D.   On: 09/07/2019 09:15    Labs:  CBC: Recent Labs    10/26/18 1003 10/27/18 0411 07/12/19 1548 08/25/19 0916  WBC 8.4 9.4 10.1 7.1  HGB 11.8* 13.0 9.9* 12.4  HCT 36.8 41.1 32.2* 37.6  PLT 231 218 288 394.0    COAGS: Recent Labs    08/25/19 0916  INR 1.2*    BMP: Recent Labs    10/26/18 1003 10/27/18 0411 07/12/19 1548  NA 136 136 136  K 3.7 3.8 3.3*  CL 107 106 106  CO2 22 22 21*  GLUCOSE 106* 157* 102*  BUN 11 8 13   CALCIUM 8.4* 8.2* 8.5*  CREATININE 0.70 0.65 0.68  GFRNONAA >60 >60 >60  GFRAA >60 >60 >60    LIVER FUNCTION TESTS: Recent Labs    10/27/18 0411  BILITOT 0.4  AST 36  ALT 26  ALKPHOS 70  PROT 7.3  ALBUMIN 3.6    TUMOR MARKERS: No results for  input(s): AFPTM, CEA, CA199, CHROMGRNA in the last 8760 hours.  Assessment and Plan: Patient with past medical history of COVID pneumonia presents with complaint of lymphadenopathy present since March 2020, slightly increased on recent imaging.  IR consulted for lymph node biopsy at the request of Dr. [id number]. Case reviewed by Dr. Isaiah Serge who approves patient for procedure.  Patient presents today in their usual state of health.  She has been NPO and is not currently on blood thinners.    Risks and benefits of biopsy was discussed with the patient and/or patient's family including, but not limited to bleeding, infection, damage to adjacent structures or low yield requiring additional tests.  All of the questions were answered and there is agreement to proceed.  Consent signed and in chart.  Thank you for this interesting consult.  I greatly enjoyed meeting Donna Fitzgerald and look forward to participating in their care.  A copy of this report was sent to the requesting provider on this date.  Electronically Signed: Michaelyn Barter, PA 09/21/2019, 12:24 PM   I spent a total of  30 Minutes   in face to face in clinical consultation, greater than 50% of which was counseling/coordinating care for lymphadenopathy.

## 2019-09-22 LAB — ACID FAST SMEAR (AFB, MYCOBACTERIA): Acid Fast Smear: NEGATIVE

## 2019-09-22 LAB — SURGICAL PATHOLOGY

## 2019-09-26 ENCOUNTER — Telehealth: Payer: Self-pay | Admitting: Pulmonary Disease

## 2019-09-26 LAB — AEROBIC/ANAEROBIC CULTURE W GRAM STAIN (SURGICAL/DEEP WOUND): Culture: NO GROWTH

## 2019-09-26 NOTE — Telephone Encounter (Signed)
-----   Message from Chilton Greathouse, MD sent at 09/25/2019  3:34 PM EST ----- Her lymph node biopsy is nondiagnostic as there were insufficient cells.Please make a follow-up visit either in person or tele visit to discuss next steps

## 2019-09-26 NOTE — Telephone Encounter (Signed)
I called and spoke with the patient and made her aware. She verbalized understanding. I have made her a Televisit with Dr. Isaiah Serge for 10/12/19. I have made Dr. Isaiah Serge aware that we do not need him to answer this telephone note and he has already given me the results. Nothing further is needed at this time.

## 2019-09-26 NOTE — Telephone Encounter (Signed)
Spoke with pt. States that she received a call from our office stating that she needed to call us back. I do not see where anyone from our office called her. Pt is wanting the results from her biopsy that was done last week.  Dr. Isaiah Serge - please advise. Thanks.

## 2019-10-12 ENCOUNTER — Encounter: Payer: Self-pay | Admitting: Nurse Practitioner

## 2019-10-12 ENCOUNTER — Ambulatory Visit (INDEPENDENT_AMBULATORY_CARE_PROVIDER_SITE_OTHER): Payer: Commercial Managed Care - PPO | Admitting: Pulmonary Disease

## 2019-10-12 ENCOUNTER — Other Ambulatory Visit: Payer: Self-pay

## 2019-10-12 ENCOUNTER — Encounter: Payer: Self-pay | Admitting: Pulmonary Disease

## 2019-10-12 DIAGNOSIS — R590 Localized enlarged lymph nodes: Secondary | ICD-10-CM

## 2019-10-12 DIAGNOSIS — R942 Abnormal results of pulmonary function studies: Secondary | ICD-10-CM

## 2019-10-12 DIAGNOSIS — R591 Generalized enlarged lymph nodes: Secondary | ICD-10-CM | POA: Diagnosis not present

## 2019-10-12 DIAGNOSIS — R599 Enlarged lymph nodes, unspecified: Secondary | ICD-10-CM

## 2019-10-12 NOTE — Progress Notes (Signed)
Virtual Visit via Telephone Note  I connected with Donna Fitzgerald on 10/12/19 at  8:45 AM EST by telephone and verified that I am speaking with the correct person using two identifiers.  Location: Patient: Home Provider: [facility], [address], [place], [place]   I discussed the limitations, risks, security and privacy concerns of performing an evaluation and management service by telephone and the availability of in person appointments. I also discussed with the patient that there may be a patient responsible charge related to this service. The patient expressed understanding and agreed to proceed.   History of Present Illness: Follow-up for dyspnea, lymphadenopathy  54 year old smoker with history of hypertension, arthritis presenting dyspnea for the past 2 months.  She has chronic shortness of breath with rest and exertion associated with cough, white mucus.  No wheezing.  Noted to have mediastinal, axillary lymphadenopathy on CT scan with nonspecific uptake on PET scan  Observations/Objective: Underwent axillary lymph node biopsy earlier this month and is here for review of results and plan for next steps States that dyspnea is stable at baseline.  Surgical biopsy right axillary lymph node 09/21/2019- Limited lymphoid tissue with no granuloma or malignancy identified.  Insufficient cells present for flow cytometry  PET scan 09/07/2019- Moderate FDG uptake in mediastinal lymph nodes, right axillary lymph node, right hilar lymph node.  Nonspecific uptake in the distal esophagus  Assessment and Plan: Lymphadenopathy No clear evidence of sarcoid or malignancy on biopsy of axillary lymph node.  Unable to do flow cytometry due to insufficient cells. We will get a ACE level and high-res CT for monitoring I suspect that this is nonspecific however we will refer to oncology for evaluation. In case we need more tissue for analysis then we can attempt EBUS for mediastinal lymph  node biopsy  PET uptake in esophagus Referred to GI for evaluation  COPD, chronic bronchitis Continue Stiolto Awaiting PFTs  Follow Up Instructions: - ACE level, high-res CT, PFTs - GI, oncology referral  I discussed the assessment and treatment plan with the patient. The patient was provided an opportunity to ask questions and all were answered. The patient agreed with the plan and demonstrated an understanding of the instructions.   The patient was advised to call back or seek an in-person evaluation if the symptoms worsen or if the condition fails to improve as anticipated.  I provided 25 minutes of non-face-to-face time during this encounter.   Chilton Greathouse MD [facility] 10/12/2019, 9:08 AM

## 2019-10-12 NOTE — Addendum Note (Signed)
Addended by: Demetrio Lapping E on: 10/12/2019 05:01 PM   Modules accepted: Orders

## 2019-10-12 NOTE — Patient Instructions (Addendum)
We will get some labs including angiotensin-converting enzyme, ANA, rheumatoid factor, CCP for baseline assessment We will order high-resolution CT to be done in 5 months Follow-up in clinic after this test for review.

## 2019-10-18 ENCOUNTER — Encounter (HOSPITAL_COMMUNITY): Payer: Self-pay | Admitting: *Deleted

## 2019-10-18 ENCOUNTER — Other Ambulatory Visit: Payer: Self-pay

## 2019-10-19 ENCOUNTER — Encounter (HOSPITAL_COMMUNITY): Payer: Self-pay | Admitting: Hematology

## 2019-10-19 ENCOUNTER — Inpatient Hospital Stay (HOSPITAL_COMMUNITY): Payer: Commercial Managed Care - PPO | Attending: Hematology | Admitting: Hematology

## 2019-10-19 ENCOUNTER — Inpatient Hospital Stay (HOSPITAL_COMMUNITY): Payer: Commercial Managed Care - PPO

## 2019-10-19 VITALS — BP 123/74 | HR 79 | Temp 97.9°F | Resp 20 | Ht 62.0 in | Wt 185.1 lb

## 2019-10-19 DIAGNOSIS — R591 Generalized enlarged lymph nodes: Secondary | ICD-10-CM | POA: Diagnosis present

## 2019-10-19 DIAGNOSIS — I10 Essential (primary) hypertension: Secondary | ICD-10-CM | POA: Insufficient documentation

## 2019-10-19 DIAGNOSIS — D649 Anemia, unspecified: Secondary | ICD-10-CM | POA: Insufficient documentation

## 2019-10-19 DIAGNOSIS — R599 Enlarged lymph nodes, unspecified: Secondary | ICD-10-CM

## 2019-10-19 DIAGNOSIS — F17210 Nicotine dependence, cigarettes, uncomplicated: Secondary | ICD-10-CM | POA: Diagnosis not present

## 2019-10-19 DIAGNOSIS — R590 Localized enlarged lymph nodes: Secondary | ICD-10-CM

## 2019-10-19 DIAGNOSIS — Z79899 Other long term (current) drug therapy: Secondary | ICD-10-CM | POA: Diagnosis not present

## 2019-10-19 LAB — CBC WITH DIFFERENTIAL/PLATELET
Abs Immature Granulocytes: 0.02 10*3/uL (ref 0.00–0.07)
Basophils Absolute: 0 10*3/uL (ref 0.0–0.1)
Basophils Relative: 0 %
Eosinophils Absolute: 0.2 10*3/uL (ref 0.0–0.5)
Eosinophils Relative: 3 %
HCT: 36.4 % (ref 36.0–46.0)
Hemoglobin: 11.2 g/dL — ABNORMAL LOW (ref 12.0–15.0)
Immature Granulocytes: 0 %
Lymphocytes Relative: 24 %
Lymphs Abs: 2 10*3/uL (ref 0.7–4.0)
MCH: 28.9 pg (ref 26.0–34.0)
MCHC: 30.8 g/dL (ref 30.0–36.0)
MCV: 93.8 fL (ref 80.0–100.0)
Monocytes Absolute: 0.6 10*3/uL (ref 0.1–1.0)
Monocytes Relative: 7 %
Neutro Abs: 5.5 10*3/uL (ref 1.7–7.7)
Neutrophils Relative %: 66 %
Platelets: 367 10*3/uL (ref 150–400)
RBC: 3.88 MIL/uL (ref 3.87–5.11)
RDW: 16.9 % — ABNORMAL HIGH (ref 11.5–15.5)
WBC: 8.3 10*3/uL (ref 4.0–10.5)
nRBC: 0 % (ref 0.0–0.2)

## 2019-10-19 LAB — IRON AND TIBC
Iron: 44 ug/dL (ref 28–170)
Saturation Ratios: 11 % (ref 10.4–31.8)
TIBC: 404 ug/dL (ref 250–450)
UIBC: 360 ug/dL

## 2019-10-19 LAB — LACTATE DEHYDROGENASE: LDH: 239 U/L — ABNORMAL HIGH (ref 98–192)

## 2019-10-19 LAB — FOLATE: Folate: 12.7 ng/mL (ref >5.9)

## 2019-10-19 LAB — VITAMIN B12: Vitamin B-12: 775 pg/mL (ref 180–914)

## 2019-10-19 LAB — FERRITIN: Ferritin: 160 ng/mL (ref 11–307)

## 2019-10-19 NOTE — Assessment & Plan Note (Addendum)
1.  Mediastinal and right axillary adenopathy: -Patient had CT of the chest on 10/26/2018 which showed multifocal pneumonia and multifocal mediastinal adenopathy.  She was treated with antibiotics at that time. -CT chest PE protocol on 07/12/2019 showed lymphadenopathy anterior to the carina, AP window, pretracheal, right hilar and several prominent subcarinal lymph nodes.  Largest lymph node was measuring 2.4 cm.  Lungs showed mosaic attenuation at several sites. -She was evaluated by Dr. Isaiah Serge and a PET CT scan was done on 09/07/2019.  This showed 1.2 cm prevascular lymph node, right paratracheal lymph node, subcarinal lymph node, right hilar lymph node.  Within the right axilla there are prominent lymph nodes measuring 0.9 cm short axis with SUV 5.8.  There is increased uptake in the distal esophagus, nonspecific with SUV 5.32.  1.7 cm peripancreatic lymph node with SUV max of 2.68. -Patient denies any fevers or night sweats.  She lost 8 pounds and was trying to lose weight. -She had right axillary lymph node core biopsy on 09/21/2019 which showed limited lymphoid tissue.  Lymphoid tissue primarily composed of small round to slightly irregular lymphocytes mixed with variable number of plasma cells.  No well-formed granuloma or malignancy noted.  Insufficient cells for flow cytometry. 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  She smokes 8 to 10 cigarettes/day for 25 years.  She reports brownish sputum in small amounts. -High-resolution CT scan is being planned for sarcoidosis work-up. -We will check an LDH level and an ACE level today.  I do not feel any axillary adenopathy. -If LDH is elevated, we will plan for excision biopsy of the right axillary lymph node or core biopsy of the mediastinal lymph node by bronchoscopy.  2.  Distal esophageal uptake: -PET scan showed increased uptake in the distal esophagus with SUV 5.32 with no mass. -She is seeing GI next Wednesday for possible  endoscopy.  3.  Anemia: -She has normocytic anemia intermittently. -We will check CBC, ferritin, iron panel, B12 and folic acid.

## 2019-10-19 NOTE — Progress Notes (Signed)
[facility] CONSULT NOTE  Patient Care Team: Center, [facility] as PCP - General (General Practice)  CHIEF COMPLAINTS/PURPOSE OF CONSULTATION:  Mediastinal and right axillary adenopathy.  HISTORY OF PRESENTING ILLNESS:  Donna Fitzgerald 54 y.o. female is seen in consultation today at the request of Dr. Isaiah Serge for further work-up and management of mediastinal and right axillary lymphadenopathy.  She had a CT of the chest on 10/26/2018 which showed multifocal pneumonia and mediastinal adenopathy.  Adenopathy at that time was thought to be secondary to infection.  She had a repeat CT chest on 07/12/2019 which showed diffuse adenopathy anterior to the carina, AP window, pretracheal, right hilar and several prominent subcarinal lymph nodes with the largest lymph node measuring 2.4 cm.  Lungs showed mosaic attenuation at several sites.  She was then evaluated by Dr. Isaiah Serge and a PET CT scan was done on 09/07/2019.  This showed 1.2 cm prevascular lymph node, right paratracheal lymph node, subcarinal lymph node, right hilar lymph node.  There is also right axillary lymph node measuring 0.9 cm in short axis with SUV 5.8.  There is increased uptake in the distal esophagus nonspecific with SUV 5.32.  Patient denies any fevers or night sweats.  She lost 8 pounds as she was trying to lose weight.  She smokes 10 cigarettes/day for the past 25 years.  She reports small amounts of brownish sputum when she coughs.  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  No family history of malignancies or sarcoidosis.  Denies any bleeding per rectum or melena.  MEDICAL HISTORY:  Past Medical History:  Diagnosis Date  . Arthritis   . Hypertension     SURGICAL HISTORY: Past Surgical History:  Procedure Laterality Date  . BACK SURGERY    . HAND SURGERY Right 1995    SOCIAL HISTORY: Social History   Socioeconomic History  . Marital status: Married    Spouse name: Earvin Hansen   . Number of children: 1  . Years of education: Not on file  . Highest education level: Not on file  Occupational History  . Not on file  Tobacco Use  . Smoking status: Current Every Day Smoker    Packs/day: 0.50    Years: 30.00    Pack years: 15.00    Types: Cigarettes  . Smokeless tobacco: Never Used  Substance and Sexual Activity  . Alcohol use: Yes    Comment: 2 beverages per week  . Drug use: No  . Sexual activity: Not Currently  Other Topics Concern  . Not on file  Social History Narrative  . Not on file   Social Determinants of Health   Financial Resource Strain: Low Risk   . Difficulty of Paying Living Expenses: Not hard at all  Food Insecurity: No Food Insecurity  . Worried About Programme researcher, broadcasting/film/video in the Last Year: Never true  . Ran Out of Food in the Last Year: Never true  Transportation Needs: No Transportation Needs  . Lack of Transportation (Medical): No  . Lack of Transportation (Non-Medical): No  Physical Activity: Inactive  . Days of Exercise per Week: 0 days  . Minutes of Exercise per Session: 0 min  Stress: No Stress Concern Present  . Feeling of Stress : Not at all  Social Connections: Slightly Isolated  . Frequency of Communication with Friends and Family: Three times a week  . Frequency of Social Gatherings with Friends and Family: Three times a week  .  Attends Religious Services: More than 4 times per year  . Active Member of Clubs or Organizations: No  . Attends Banker Meetings: Never  . Marital Status: Married  Catering manager Violence: Not At Risk  . Fear of Current or Ex-Partner: No  . Emotionally Abused: No  . Physically Abused: No  . Sexually Abused: No    FAMILY HISTORY: Family History  Problem Relation Age of Onset  . Hypertension Mother   . Alzheimer's disease Mother   . Heart disease Father   . Hypertension Father   . Hypertension Sister   . Heart disease Sister   . Hypertension Sister     ALLERGIES:  has  No Known Allergies.  MEDICATIONS:  Current Outpatient Medications  Medication Sig Dispense Refill  . albuterol (PROVENTIL HFA;VENTOLIN HFA) 108 (90 Base) MCG/ACT inhaler Inhale 2 puffs into the lungs every 4 (four) hours as needed for wheezing or shortness of breath. (Patient not taking: Reported on 10/18/2019) 1 Inhaler 1  . amitriptyline (ELAVIL) 100 MG tablet TAKE 1 TABLET (100 MG TOTAL) BY MOUTH AT BEDTIME. (Patient taking differently: Take 100 mg by mouth at bedtime. ) 30 tablet 0  . amLODipine (NORVASC) 10 MG tablet Take 1 tablet (10 mg total) by mouth daily. 90 tablet 3  . aspirin 81 MG tablet Take 81 mg by mouth daily.    . enalapril (VASOTEC) 20 MG tablet TAKE 1 TABLET BY MOUTH EVERY DAY (Patient taking differently: Take 20 mg by mouth daily. ) 90 tablet 0  . gabapentin (NEURONTIN) 300 MG capsule Take 1 capsule (300 mg total) by mouth 2 (two) times daily for 30 days. 60 capsule 0  . ibuprofen (ADVIL) 600 MG tablet Take 600 mg by mouth every 6 (six) hours as needed.     . metoprolol (LOPRESSOR) 50 MG tablet TAKE 1 TABLET BY MOUTH TWICE A DAY (Patient taking differently: Take 50 mg by mouth 2 (two) times daily. ) 60 tablet 0  . pravastatin (PRAVACHOL) 80 MG tablet Take 80 mg by mouth daily.    . Tiotropium Bromide-Olodaterol (STIOLTO RESPIMAT) 2.5-2.5 MCG/ACT AERS Inhale 2 puffs into the lungs daily. 4 g 0   No current facility-administered medications for this visit.    REVIEW OF SYSTEMS:   Constitutional: Denies fevers, chills or abnormal night sweats Eyes: Denies blurriness of vision, double vision or watery eyes Ears, nose, mouth, throat, and face: Denies mucositis or sore throat Respiratory: Positive for cough and shortness of breath on exertion. Cardiovascular: Denies palpitation, chest discomfort or lower extremity swelling Gastrointestinal:  Denies nausea, heartburn or change in bowel habits Skin: Denies abnormal skin rashes Lymphatics: Denies new lymphadenopathy or easy  bruising Neurological:Denies numbness, tingling or new weaknesses Behavioral/Psych: Mood is stable, no new changes  All other systems were reviewed with the patient and are negative.  PHYSICAL EXAMINATION: ECOG PERFORMANCE STATUS: 0 - Asymptomatic  Vitals:   10/19/19 1253  BP: 123/74  Pulse: 79  Resp: 20  Temp: 97.9 F (36.6 C)  SpO2: 99%   Filed Weights   10/19/19 1253  Weight: 185 lb 1.6 oz (84 kg)    GENERAL:alert, no distress and comfortable SKIN: skin color, texture, turgor are normal, no rashes or significant lesions EYES: normal, conjunctiva are pink and non-injected, sclera clear OROPHARYNX:no exudate, no erythema and lips, buccal mucosa, and tongue normal  NECK: supple, thyroid normal size, non-tender, without nodularity LYMPH:  no palpable lymphadenopathy in the cervical, axillary or inguinal LUNGS: clear to auscultation and  percussion with normal breathing effort HEART: regular rate & rhythm and no murmurs and no lower extremity edema ABDOMEN:abdomen soft, non-tender and normal bowel sounds Musculoskeletal:no cyanosis of digits and no clubbing  PSYCH: alert & oriented x 3 with fluent speech NEURO: no focal motor/sensory deficits  LABORATORY DATA:  I have reviewed the data as listed Lab Results  Component Value Date   WBC 8.3 10/19/2019   HGB 11.2 (L) 10/19/2019   HCT 36.4 10/19/2019   MCV 93.8 10/19/2019   PLT 367 10/19/2019     Chemistry      Component Value Date/Time   NA 136 07/12/2019 1548   K 3.3 (L) 07/12/2019 1548   CL 106 07/12/2019 1548   CO2 21 (L) 07/12/2019 1548   BUN 13 07/12/2019 1548   CREATININE 0.68 07/12/2019 1548   CREATININE 0.69 02/07/2013 1041      Component Value Date/Time   CALCIUM 8.5 (L) 07/12/2019 1548   ALKPHOS 70 10/27/2018 0411   AST 36 10/27/2018 0411   ALT 26 10/27/2018 0411   BILITOT 0.4 10/27/2018 0411       RADIOGRAPHIC STUDIES: I have personally reviewed the radiological images as listed and agreed with  the findings in the report. [place] CORE BIOPSY (LYMPH NODES)  Result Date: 09/21/2019 INDICATION: No known primary, now with indeterminate hypermetabolic right axillary and mediastinal lymphadenopathy. Please perform ultrasound-guided biopsy for tissue diagnostic purposes. EXAM: ULTRASOUND-GUIDED RIGHT AXILLARY LYMPH NODE BIOPSY COMPARISON:  PET CT-09/07/2019; chest CT-07/12/2019; 10/26/2018 MEDICATIONS: None ANESTHESIA/SEDATION: Moderate (conscious) sedation was employed during this procedure. A total of Versed 1 mg and Fentanyl 25 mcg was administered intravenously. Moderate Sedation Time: 10 minutes. The patient's level of consciousness and vital signs were monitored continuously by radiology nursing throughout the procedure under my direct supervision. COMPLICATIONS: None immediate. TECHNIQUE: Informed written consent was obtained from the patient after a discussion of the risks, benefits and alternatives to treatment. Questions regarding the procedure were encouraged and answered. Initial ultrasound scanning demonstrated an approximately 1.9 x 1.1 cm right axillary lymph node correlating with the hypermetabolic right axillary lymph node seen on preceding PET-CT image 53, series 4). An ultrasound image was saved for documentation purposes. The procedure was planned. A timeout was performed prior to the initiation of the procedure. The operative was prepped and draped in the usual sterile fashion, and a sterile drape was applied covering the operative field. A timeout was performed prior to the initiation of the procedure. Local anesthesia was provided with 1% lidocaine with epinephrine. Under direct ultrasound guidance, an 18 gauge core needle device was utilized to obtain to obtain 6 core needle biopsies of the dominant right axillary. The samples were placed in saline and submitted to pathology. The needle was removed and hemostasis was achieved with manual compression. Post procedure scan was negative for  significant hematoma. A dressing was placed. The patient tolerated the procedure well without immediate postprocedural complication. IMPRESSION: Technically successful ultrasound guided biopsy of dominant right axillary lymph node. Electronically Signed   By: Simonne Come M.D.   On: 09/21/2019 14:39    ASSESSMENT & PLAN:  Mediastinal lymphadenopathy 1.  Mediastinal and right axillary adenopathy: -Patient had CT of the chest on 10/26/2018 which showed multifocal pneumonia and multifocal mediastinal adenopathy.  She was treated with antibiotics at that time. -CT chest PE protocol on 07/12/2019 showed lymphadenopathy anterior to the carina, AP window, pretracheal, right hilar and several prominent subcarinal lymph nodes.  Largest lymph node was measuring 2.4 cm.  Lungs  showed mosaic attenuation at several sites. -She was evaluated by Dr. Vaughan Browner and a PET CT scan was done on 09/07/2019.  This showed 1.2 cm prevascular lymph node, right paratracheal lymph node, subcarinal lymph node, right hilar lymph node.  Within the right axilla there are prominent lymph nodes measuring 0.9 cm short axis with SUV 5.8.  There is increased uptake in the distal esophagus, nonspecific with SUV 5.32.  1.7 cm peripancreatic lymph node with SUV max of 2.68. -Patient denies any fevers or night sweats.  She lost 8 pounds and was trying to lose weight. -She had right axillary lymph node core biopsy on 09/21/2019 which showed limited lymphoid tissue.  Lymphoid tissue primarily composed of small round to slightly irregular lymphocytes mixed with variable number of plasma cells.  No well-formed granuloma or malignancy noted.  Insufficient cells for flow cytometry. 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  She smokes 8 to 10 cigarettes/day for 25 years.  She reports brownish sputum in small amounts. -High-resolution CT scan is being planned for sarcoidosis work-up. -We will check an LDH level and an ACE level today.  I  do not feel any axillary adenopathy. -If LDH is elevated, we will plan for excision biopsy of the right axillary lymph node or core biopsy of the mediastinal lymph node by bronchoscopy.  2.  Distal esophageal uptake: -PET scan showed increased uptake in the distal esophagus with SUV 5.32 with no mass. -She is seeing GI next Wednesday for possible endoscopy.  3.  Anemia: -She has normocytic anemia intermittently. -We will check CBC, ferritin, iron panel, U20 and folic acid.   Orders Placed This Encounter  Procedures  . Lactate dehydrogenase    Standing Status:   Future    Number of Occurrences:   1    Standing Expiration Date:   10/18/2020  . CBC with Differential/Platelet    Standing Status:   Future    Number of Occurrences:   1    Standing Expiration Date:   10/18/2020  . Iron and TIBC    Standing Status:   Future    Number of Occurrences:   1    Standing Expiration Date:   10/18/2020  . Ferritin    Standing Status:   Future    Number of Occurrences:   1    Standing Expiration Date:   10/18/2020  . Vitamin B12    Standing Status:   Future    Number of Occurrences:   1    Standing Expiration Date:   10/18/2020  . Folate    Standing Status:   Future    Number of Occurrences:   1    Standing Expiration Date:   10/18/2020  . Angiotensin converting enzyme    Standing Status:   Future    Number of Occurrences:   1    Standing Expiration Date:   10/18/2020    All questions were answered. The patient knows to call the clinic with any problems, questions or concerns.      Derek Jack, MD 10/19/2019 7:04 PM

## 2019-10-19 NOTE — Patient Instructions (Addendum)
[facility] at [facility] Discharge Instructions  You were seen today by Dr. Ellin Saba. He went over your history, family history and how you've been feeling lately. You will have labs drawn today before you leave. He will follow up by phone in 2 weeks.   Thank you for choosing [facility] at [facility] to provide your oncology and hematology care.  To afford each patient quality time with our provider, please arrive at least 15 minutes before your scheduled appointment time.   If you have a lab appointment with the Cancer Center please come in thru the  Main Entrance and check in at the main information desk  You need to re-schedule your appointment should you arrive 10 or more minutes late.  We strive to give you quality time with our providers, and arriving late affects you and other patients whose appointments are after yours.  Also, if you no show three or more times for appointments you may be dismissed from the clinic at the providers discretion.     Again, thank you for choosing [facility].  Our hope is that these requests will decrease the amount of time that you wait before being seen by our physicians.       _____________________________________________________________  Should you have questions after your visit to [facility], please contact our office at [phone] between the hours of 8:00 a.m. and 4:30 p.m.  Voicemails left after 4:00 p.m. will not be returned until the following business day.  For prescription refill requests, have your pharmacy contact our office and allow 72 hours.    Cancer Center Support Programs:   > Cancer Support Group  2nd Tuesday of the month 1pm-2pm, Journey Room

## 2019-10-20 LAB — ANGIOTENSIN CONVERTING ENZYME: Angiotensin-Converting Enzyme: 45 U/L (ref 14–82)

## 2019-10-25 LAB — FUNGUS CULTURE WITH STAIN

## 2019-10-25 LAB — FUNGAL ORGANISM REFLEX

## 2019-10-25 LAB — FUNGUS CULTURE RESULT

## 2019-10-26 ENCOUNTER — Other Ambulatory Visit: Payer: Self-pay

## 2019-10-26 ENCOUNTER — Ambulatory Visit (INDEPENDENT_AMBULATORY_CARE_PROVIDER_SITE_OTHER): Payer: Commercial Managed Care - PPO | Admitting: Nurse Practitioner

## 2019-10-26 ENCOUNTER — Encounter: Payer: Self-pay | Admitting: Nurse Practitioner

## 2019-10-26 VITALS — BP 120/80 | HR 80 | Temp 98.5°F | Ht 61.5 in | Wt 184.2 lb

## 2019-10-26 DIAGNOSIS — R948 Abnormal results of function studies of other organs and systems: Secondary | ICD-10-CM | POA: Diagnosis not present

## 2019-10-26 DIAGNOSIS — Z1211 Encounter for screening for malignant neoplasm of colon: Secondary | ICD-10-CM | POA: Diagnosis not present

## 2019-10-26 DIAGNOSIS — K219 Gastro-esophageal reflux disease without esophagitis: Secondary | ICD-10-CM | POA: Diagnosis not present

## 2019-10-26 DIAGNOSIS — Z01818 Encounter for other preprocedural examination: Secondary | ICD-10-CM

## 2019-10-26 MED ORDER — OMEPRAZOLE 20 MG PO CPDR: 20.0000 mg | DELAYED_RELEASE_CAPSULE | ORAL | 3 refills | Status: DC

## 2019-10-26 NOTE — Patient Instructions (Signed)
If you are age 53 or older, your body mass index should be between 23-30. Your Body mass index is 34.25 kg/m. If this is out of the aforementioned range listed, please consider follow up with your Primary Care Provider.  If you are age 50 or younger, your body mass index should be between 19-25. Your Body mass index is 34.25 kg/m. If this is out of the aformentioned range listed, please consider follow up with your Primary Care Provider.   You have been scheduled for an endoscopy and colonoscopy. Please follow the written instructions given to you at your visit today. Please pick up your prep supplies at the pharmacy within the next 1-3 days. If you use inhalers (even only as needed), please bring them with you on the day of your procedure. Your physician has requested that you go to www.startemmi.com and enter the access code given to you at your visit today. This web site gives a general overview about your procedure. However, you should still follow specific instructions given to you by our office regarding your preparation for the procedure.  We have sent the following medications to your pharmacy for you to pick up at your convenience: Omeprazole 20 mg  You have been given GERD Literature.  Thank you for choosing me and [facility].   Willette Cluster, NP

## 2019-10-26 NOTE — Progress Notes (Signed)
Referring Provider: Marshell Garfinkel, MD  Reason for Referral :    Abnormal esophagus on PET        ASSESSMENT / PLAN:   Donna Fitzgerald is a 54 y.o. female with a pmh significant for, but not necessarily limited to, HTN, tobacco abuse, COVID-06 June 2019  # Mediastinal and right axillary adenopathy  -Under evaluation by Pulmonary care and Oncology - right axillary lymph node biopsy early February.  No malignancy identified.  Insufficient cells present for flow cytometry .  # Abnormal esophageal findings on PET scan --PET scan shows nonspecific radiotracer uptake in the distal esophagus, ? esophagitis.   --For further evaluation of PET scan findings will schedule patient for EGD. The risks and benefits of EGD were discussed and the patient agrees to proceed.   # GERD, heartburn x 3 months.  -Discussed antireflux measures and brochure given -Trial of omeprazole 20 mg every morning  # Colon cancer screening.  -It is reasonable to perform this at the time of EGD. The risks and benefits of colonoscopy with possible polypectomy / biopsies were discussed and the patient agrees to proceed.   # mild [place] anemia.  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 She was evaluated by pulmonary care early January, lymphadenopathy on CT scan had increased from early 2020.  She had a right axillary lymph node biopsy early February.  No malignancy identified.  Insufficient cells present for flow cytometry . She had a PET scan  with findings of moderate FDG uptake in mediastinal lymph nodes, right axillary lymph node, right hilar lymph node and nonspecific uptake in the distal esophagus.  She denies fevers or night sweats.. She has intentionally lost a few pounds recently.  Bowel movements  are normal.  No blood in stool.  No family history of colon cancer.   Past Medical History:  Diagnosis Date  . Arthritis   . HLD (hyperlipidemia)   . Hypertension      Past Surgical History:  Procedure Laterality Date  . FINGER SURGERY Right 1995   [place]  . LUMBAR SPINE SURGERY     Family History  Problem Relation Age of Onset  . Hypertension Mother   . Alzheimer's disease Mother   . Diabetes Mother   . Heart disease Father   . Hypertension Father   . Hypertension Sister   . Heart disease Sister   . Heart disease Sister   . Hypertension Sister    Social History   Tobacco Use  . Smoking status: Current Every Day Smoker    Packs/day: 0.50    Years: 30.00    Pack years: 15.00    Types: Cigarettes  . Smokeless tobacco: Never Used  Substance Use Topics  . Alcohol use: Yes    Comment: ocassional on the weekends  . Drug use: No   Current Outpatient Medications  Medication Sig Dispense Refill  . albuterol (PROVENTIL HFA;VENTOLIN HFA) 108 (90 Base) MCG/ACT inhaler Inhale 2 puffs into the lungs every 4 (four) hours as needed for wheezing or shortness of breath. 1 Inhaler 1  . amitriptyline (ELAVIL) 100 MG tablet TAKE 1 TABLET (100 MG TOTAL) BY MOUTH AT BEDTIME. (Patient taking differently: Take 100 mg by mouth at bedtime. ) 30 tablet  0  . amLODipine (NORVASC) 10 MG tablet Take 1 tablet (10 mg total) by mouth daily. 90 tablet 3  . aspirin 81 MG tablet Take 81 mg by mouth daily.    . enalapril (VASOTEC) 20 MG tablet TAKE 1 TABLET BY MOUTH EVERY DAY (Patient taking differently: Take 20 mg by mouth daily. ) 90 tablet 0  . gabapentin (NEURONTIN) 300 MG capsule Take 1 capsule (300 mg total) by mouth 2 (two) times daily for 30 days. 60 capsule 0  . ibuprofen (ADVIL) 600 MG tablet Take 600 mg by mouth every 6 (six) hours as needed.     . metoprolol (LOPRESSOR) 50 MG tablet TAKE 1 TABLET BY MOUTH TWICE A DAY (Patient taking differently: Take 50 mg by mouth 2 (two) times daily. ) 60  tablet 0  . pravastatin (PRAVACHOL) 80 MG tablet Take 80 mg by mouth daily.    . Tiotropium Bromide-Olodaterol (STIOLTO RESPIMAT) 2.5-2.5 MCG/ACT AERS Inhale 2 puffs into the lungs daily. 4 g 0   No current facility-administered medications for this visit.   No Known Allergies   Review of Systems: Positive for back pain, cough, shortness of breath, night sweats, sore throat.  All other systems reviewed and negative except where noted in HPI.   Creatinine clearance cannot be calculated (Patient's most recent lab result is older than the maximum 21 days allowed.)   Physical Exam:    Wt Readings from Last 3 Encounters:  10/26/19 184 lb 4 oz (83.6 kg)  10/19/19 185 lb 1.6 oz (84 kg)  09/21/19 185 lb (83.9 kg)    BP 120/80 (BP Location: Left Arm, Patient Position: Sitting, Cuff Size: Normal)   Pulse 80   Temp 98.5 F (36.9 C)   Ht 5' 1.5" (1.562 m) Comment: height measured without shoes  Wt 184 lb 4 oz (83.6 kg)   LMP 05/04/2013   BMI 34.25 kg/m  Constitutional:  Pleasant female in no acute distress. Psychiatric: Normal mood and affect. Behavior is normal. EENT: Pupils normal.  Conjunctivae are normal. No scleral icterus. Neck supple.  Cardiovascular: Normal rate, regular rhythm. No edema Pulmonary/chest: Effort normal and breath sounds normal. No wheezing, rales or rhonchi. Abdominal: Soft, nondistended, nontender. Bowel sounds active throughout. There are no masses palpable. No hepatomegaly. Neurological: Alert and oriented to person place and time. Skin: Skin is warm and dry. No rashes noted.  Willette Cluster, NP  10/26/2019, 11:25 AM  Cc:  Referring Provider Chilton Greathouse, MD

## 2019-10-31 ENCOUNTER — Other Ambulatory Visit (HOSPITAL_COMMUNITY): Payer: Self-pay | Admitting: *Deleted

## 2019-11-01 ENCOUNTER — Ambulatory Visit (INDEPENDENT_AMBULATORY_CARE_PROVIDER_SITE_OTHER): Payer: Commercial Managed Care - PPO | Admitting: General Surgery

## 2019-11-01 ENCOUNTER — Other Ambulatory Visit: Payer: Self-pay

## 2019-11-01 ENCOUNTER — Encounter: Payer: Self-pay | Admitting: General Surgery

## 2019-11-01 ENCOUNTER — Encounter (HOSPITAL_COMMUNITY): Payer: Self-pay | Admitting: *Deleted

## 2019-11-01 VITALS — BP 98/61 | HR 82 | Temp 98.1°F | Resp 12 | Ht 61.5 in | Wt 185.0 lb

## 2019-11-01 DIAGNOSIS — R591 Generalized enlarged lymph nodes: Secondary | ICD-10-CM | POA: Diagnosis not present

## 2019-11-01 NOTE — Progress Notes (Signed)
Per Dr. Ellin Saba, referral sent to [facility].  They are on vacation this week and are not able to see the patient until Thursday 3/25 at 9 am.  I have left patient a VM and asked that she call us back so that we can go over instructions.

## 2019-11-01 NOTE — Patient Instructions (Signed)
Lymphadenopathy  Lymphadenopathy means that your lymph glands are swollen or larger than normal (enlarged). Lymph glands, also called lymph nodes, are collections of tissue that filter bacteria, viruses, and waste from your bloodstream. They are part of your body's disease-fighting system (immune system), which protects your body from germs. There may be different causes of lymphadenopathy, depending on where it is in your body. Some types go away on their own. Lymphadenopathy can occur anywhere that you have lymph glands, including these areas:  Neck (cervical lymphadenopathy).  Chest (mediastinal lymphadenopathy).  Lungs (hilar lymphadenopathy).  Underarms (axillary lymphadenopathy).  Groin (inguinal lymphadenopathy). When your immune system responds to germs, infection-fighting cells and fluid build up in your lymph glands. This causes some swelling and enlargement. If the lymph glands do not go back to normal after you have an infection or disease, your health care provider may do tests. These tests help to monitor your condition and find the reason why the glands are still swollen and enlarged. Follow these instructions at home:  Get plenty of rest.  Take over-the-counter and prescription medicines only as told by your health care provider. Your health care provider may recommend over-the-counter medicines for pain.  If directed, apply heat to swollen lymph glands as often as told by your health care provider. Use the heat source that your health care provider recommends, such as a moist heat pack or a heating pad. ? Place a towel between your skin and the heat source. ? Leave the heat on for 20-30 minutes. ? Remove the heat if your skin turns bright red. This is especially important if you are unable to feel pain, heat, or cold. You may have a greater risk of getting burned.  Check your affected lymph glands every day for changes. Check other lymph gland areas as told by your health  care provider. Check for changes such as: ? More swelling. ? Sudden increase in size. ? Redness or pain. ? Hardness.  Keep all follow-up visits as told by your health care provider. This is important. Contact a health care provider if you have:  Swelling that gets worse or spreads to other areas.  Problems with breathing.  Lymph glands that: ? Are still swollen after 2 weeks. ? Have suddenly gotten bigger. ? Are red, painful, or hard.  A fever or chills.  Fatigue.  A sore throat.  Pain in your abdomen.  Weight loss.  Night sweats. Get help right away if you have:  Fluid leaking from an enlarged lymph gland.  Severe pain.  Chest pain.  Shortness of breath. Summary  Lymphadenopathy means that your lymph glands are swollen or larger than normal (enlarged).  Lymph glands (also called lymph nodes) are collections of tissue that filter bacteria, viruses, and waste from the bloodstream. They are part of your body's disease-fighting system (immune system).  Lymphadenopathy can occur anywhere that you have lymph glands.  If your enlarged and swollen lymph glands do not go back to normal after you have an infection or disease, your health care provider may do tests to monitor your condition and find the reason why the glands are still swollen and enlarged.  Check your affected lymph glands every day for changes. Check other lymph gland areas as told by your health care provider. This information is not intended to replace advice given to you by your health care provider. Make sure you discuss any questions you have with your health care provider. Document Revised: 07/17/2017 Document Reviewed: 06/19/2017 Elsevier Patient   Education  [employer]. Open Lymph Node Biopsy An open lymph node biopsy is a procedure to remove a lymph node so that it can be examined under a microscope. Lymph nodes are part of the body's disease-fighting system (immune system). The immune  system protects the body from infections, germs, and diseases. An open lymph node biopsy may be done to:  Look for germs or cancer cells in your lymph node.  Find out why your lymph node is swollen.  Find out more about a condition you have. Lymph nodes are found in many locations in the body. Biopsies are often done on lymph nodes in the head, neck, armpit, or groin. Tell a health care provider about:  Any allergies you have.  All medicines you are taking, including vitamins, herbs, eye drops, creams, and over-the-counter medicines.  Any problems you or family members have had with anesthetic medicines.  Any blood disorders you have.  Any surgeries you have had.  Any medical conditions you have or have had.  Whether you are pregnant or may be pregnant. What are the risks? Generally, this is a safe procedure. However, problems may occur, including:  Infection.  Bleeding.  Allergic reactions to medicines.  Damage to other structures or organs, such as a nerve.  Scarring. What happens before the procedure? Medicines Ask your health care provider about:  Changing or stopping your regular medicines. This is especially important if you are taking diabetes medicines or blood thinners.  Taking medicines such as aspirin and ibuprofen. These medicines can thin your blood. Do not take these medicines unless your health care provider tells you to take them.  Taking over-the-counter medicines, vitamins, herbs, and supplements. General instructions  Follow instructions from your health care provider about eating or drinking restrictions.  You may have an exam or testing.  You may have a blood or urine sample taken.  Plan to have someone take you home from the hospital or clinic.  If you will be going home right after the procedure, plan to have someone with you for 24 hours.  Ask your health care provider how your surgical site will be marked or identified.  Ask your  health care provider what steps will be taken to help prevent infection. These may include: ? Removing hair at the biopsy site. ? Washing skin with a germ-killing soap. ? Taking antibiotic medicine. What happens during the procedure?   An IV will be inserted into one of your veins.  You will be given one or more of the following: ? A medicine to help you relax (sedative). ? A medicine to numb the area (local anesthetic).  An incision will be made in the area where your lymph node is located.  Your lymph node will be removed.  Your incision will be closed with stitches (sutures).  An antibiotic ointment may be applied to your incision.  A bandage (dressing) will be placed over your incision. The procedure may vary among health care providers and hospitals. What happens after the procedure?  Your blood pressure, heart rate, breathing rate, and blood oxygen level will be monitored until you leave the hospital or clinic.  Do not drive for 24 hours if you were given a sedative during your procedure.  It is up to you to get the results of your procedure. Ask your health care provider, or the department that is doing the procedure, when your results will be ready. Summary  An open lymph node biopsy is a procedure to  remove a lymph node so that it can be checked for infections, germs, and disease.  Generally, this is a safe procedure. However, problems may occur, including bleeding, infection, allergic reaction to medicines, and damage to other structures or organs.  Follow your health care provider's instructions before the procedure. These may include changing or stopping some medicines and restricting what you eat and drink.  During the procedure, an incision will be made in the area of the lymph node, the lymph node will be removed, and the incision will be closed with sutures.  You will be monitored after the procedure. Do not drive for 24 hours if you were given a sedative  during your procedure. This information is not intended to replace advice given to you by your health care provider. Make sure you discuss any questions you have with your health care provider. Document Revised: 03/11/2018 Document Reviewed: 03/11/2018 Elsevier Patient Education  2020 [employer].

## 2019-11-02 NOTE — Progress Notes (Signed)
Donna Fitzgerald; [id number]; May 26, 1966   HPI Patient is a 54 year old black female who was referred to my care by Dr. Ellin Saba of oncology for a right axillary lymph node biopsy.  Patient was noted to have diffuse lymphadenopathy and a recent PET scan showed adenopathy in the mediastinum and right axillary regions.  Initial fine-needle aspiration of the right axillary lymph node biopsy was insufficient.  Patient denies any fever or chills.  Patient currently has 0 out of 10 pain. Past Medical History:  Diagnosis Date  . Arthritis   . HLD (hyperlipidemia)   . Hypertension     Past Surgical History:  Procedure Laterality Date  . FINGER SURGERY Right 1995   [place]  . LUMBAR SPINE SURGERY      Family History  Problem Relation Age of Onset  . Hypertension Mother   . Alzheimer's disease Mother   . Diabetes Mother   . Heart disease Father   . Hypertension Father   . Hypertension Sister   . Heart disease Sister   . Heart disease Sister   . Hypertension Sister     Current Outpatient Medications on File Prior to Visit  Medication Sig Dispense Refill  . albuterol (PROVENTIL HFA;VENTOLIN HFA) 108 (90 Base) MCG/ACT inhaler Inhale 2 puffs into the lungs every 4 (four) hours as needed for wheezing or shortness of breath. 1 Inhaler 1  . amitriptyline (ELAVIL) 100 MG tablet TAKE 1 TABLET (100 MG TOTAL) BY MOUTH AT BEDTIME. (Patient taking differently: Take 100 mg by mouth at bedtime. ) 30 tablet 0  . amLODipine (NORVASC) 10 MG tablet Take 1 tablet (10 mg total) by mouth daily. 90 tablet 3  . aspirin 81 MG tablet Take 81 mg by mouth daily.    . enalapril (VASOTEC) 20 MG tablet TAKE 1 TABLET BY MOUTH EVERY DAY (Patient taking differently: Take 20 mg by mouth daily. ) 90 tablet 0  . ibuprofen (ADVIL) 600 MG tablet Take 600 mg by mouth every 6 (six) hours as needed.     . metoprolol (LOPRESSOR) 50 MG tablet TAKE 1 TABLET BY MOUTH TWICE A DAY (Patient taking differently: Take 50 mg by mouth 2  (two) times daily. ) 60 tablet 0  . omeprazole (PRILOSEC) 20 MG capsule Take 1 capsule (20 mg total) by mouth every morning. 30 capsule 3  . pravastatin (PRAVACHOL) 80 MG tablet Take 80 mg by mouth daily.    . Tiotropium Bromide-Olodaterol (STIOLTO RESPIMAT) 2.5-2.5 MCG/ACT AERS Inhale 2 puffs into the lungs daily. 4 g 0  . gabapentin (NEURONTIN) 300 MG capsule Take 1 capsule (300 mg total) by mouth 2 (two) times daily for 30 days. 60 capsule 0   No current facility-administered medications on file prior to visit.    No Known Allergies  Social History   Substance and Sexual Activity  Alcohol Use Yes   Comment: ocassional on the weekends    Social History   Tobacco Use  Smoking Status Current Every Day Smoker  . Packs/day: 0.50  . Years: 30.00  . Pack years: 15.00  . Types: Cigarettes  Smokeless Tobacco Never Used    Review of Systems  Constitutional: Positive for malaise/fatigue.  HENT: Negative.   Eyes: Negative.   Respiratory: Negative.   Cardiovascular: Negative.   Gastrointestinal: Positive for heartburn.  Musculoskeletal: Positive for back pain.  Skin: Negative.   Neurological: Negative.   Endo/Heme/Allergies: Negative.   Psychiatric/Behavioral: Negative.     Objective   Vitals:   11/01/19 1517  BP: 98/61  Pulse: 82  Resp: 12  Temp: 98.1 F (36.7 C)  SpO2: 92%    Physical Exam Vitals reviewed.  Constitutional:      Appearance: Normal appearance. She is obese. She is not ill-appearing.  HENT:     Head: Normocephalic and atraumatic.  Cardiovascular:     Rate and Rhythm: Normal rate and regular rhythm.     Heart sounds: Normal heart sounds. No murmur. No friction rub. No gallop.   Pulmonary:     Effort: Pulmonary effort is normal. No respiratory distress.     Breath sounds: Normal breath sounds. No stridor. No wheezing, rhonchi or rales.  Skin:    General: Skin is warm and dry.  Neurological:     Mental Status: She is alert and oriented to  person, place, and time.   Lymphatics: Difficult to palpate any specific lymphadenopathy in the right axilla due to body habitus.  No supraclavicular lymphadenopathy noted.  PET scan images personally reviewed  Assessment  Right axillary lymphadenopathy, generalized lymphadenopathy Plan   Patient is scheduled for a right axillary lymph node biopsy on 11/16/2019.  The risks and benefits of the procedure including bleeding and infection were fully explained to the patient, who gave informed consent.

## 2019-11-04 LAB — ACID FAST CULTURE WITH REFLEXED SENSITIVITIES (MYCOBACTERIA): Acid Fast Culture: NEGATIVE

## 2019-11-09 ENCOUNTER — Ambulatory Visit (HOSPITAL_COMMUNITY): Payer: Commercial Managed Care - PPO | Admitting: Hematology

## 2019-11-10 ENCOUNTER — Ambulatory Visit: Payer: Commercial Managed Care - PPO | Admitting: General Surgery

## 2019-11-11 NOTE — Patient Instructions (Signed)
Donna Fitzgerald  11/11/2019     @PREFPERIOPPHARMACY @   Your procedure is scheduled on  11/16/2019 .  [facility] at  [phone]  A.M.  Call this number if you have problems the morning of surgery:  [phone]   Remember:  Do not eat or drink after midnight.                        Take these medicines the morning of surgery with A SIP OF WATER  Amlodipine, gabapentin, metoprolol, omeprazole. Use your inhaler before you come.    Do not wear jewelry, make-up or nail polish.  Do not wear lotions, powders, or perfumes, or deodorant. Please brush your teeth.  Do not shave 48 hours prior to surgery.  Men may shave face and neck.  Do not bring valuables to the hospital.  [facility] any belongings or valuables.  Contacts, dentures or bridgework may not be worn into surgery.  Leave your suitcase in the car.  After surgery it may be brought to your room.  For patients admitted to the hospital, discharge time will be determined by your treatment team.  Patients discharged the day of surgery will not be allowed to drive home.   Name and phone number of your driver:   family Special instructions:  DO NOT smoke the morning of your procedure.  Please read over the following fact sheets that you were given. Anesthesia Post-op Instructions and Care and Recovery After Surgery       Axillary Lymph Node Dissection, Care After This sheet gives you information about how to care for yourself after your procedure. Your health care provider may also give you more specific instructions. If you have problems or questions, contact your health care provider. What can I expect after the procedure? After the procedure, it is common to have:  Pain and soreness around your incision area.  Trouble moving your arm or shoulder.  A small amount of swelling in your arm.  Numbness on the upper and inside parts of your arm. Follow these instructions at  home: Medicines  Take over-the-counter and prescription medicines only as told by your health care provider.  If you were prescribed an antibiotic medicine, take it as told by your health care provider. Do not stop taking the antibiotic even if you start to feel better. Incision care   Follow instructions from your health care provider about how to take care of your incision. Make sure you: ? Wash your hands with soap and water before you change your bandage (dressing). If soap and water are not available, use hand sanitizer. ? Change your dressing as told by your health care provider. ? Leave stitches (sutures), skin glue, or adhesive strips in place. These skin closures may need to stay in place for 2 weeks or longer. If adhesive strip edges start to loosen and curl up, you may trim the loose edges. Do not remove adhesive strips completely unless your health care provider tells you to do that.  Check your incision area every day for signs of infection. Check for: ? Redness, swelling, or pain. ? Fluid or blood. ? Warmth. ? Pus or a bad smell. Activity  Do arm and shoulder exercises as told by your health care provider. This may prevent movement problems and stiffness.  Return to your normal activities as told by your health care provider. Ask your health care  provider what activities are safe for you. Avoid any activities that cause pain. Driving  Do not drive for 24 hours if you were given a medicine to help you relax (sedative).  Do not drive or use heavy machinery while taking prescription pain medicine. General instructions  If a drainage tube was left in your breast, care for it as told by your health care provider. The drain may stay in place for up to 7-10 days.  Wear a compression garment on your arm as told by your health care provider. This may help to prevent blood clots and reduce swelling in your arm.  Do not use any products that contain nicotine or tobacco, such as  cigarettes and e-cigarettes. If you need help quitting, ask your health care provider.  Do not take baths, swim, or use a hot tub until your health care provider approves. Ask your health care provider if you may take showers. You may only be allowed to take sponge baths for bathing.  Do not have your blood pressure taken, have blood drawn, or get injections or IVs in the arm on the side where your lymph nodes were removed.  Follow instructions from your health care provider about how often you should be screened for extra fluid around your lymph nodes (lymphedema).  Keep all follow-up visits as told by your health care provider. This is important. Contact a health care provider if:  Your arm is swollen, tight, and painful.  You have redness, swelling, or pain around your incision.  You have fluid or blood coming from your incision.  Your incision feels warm to the touch.  You have pus or a bad smell coming from your incision. Get help right away if:  You have severe pain that does not get better with medicine.  You have a fever or chills.  You are confused.  You have chest pain.  You have shortness of breath. Summary  After the procedure, it is common to have pain and soreness and trouble moving your arm or shoulder.  A small amount of arm swelling is normal after the procedure. However, you should contact your health care provider if your arm is swollen, tight, and painful.  Wear a compression garment on your arm as told by your health care provider. This may help to prevent blood clots and reduce swelling in your arm.  Do arm and shoulder exercises as directed to help prevent movement problems and stiffness.  If a drainage tube was left in your breast, care for it as told by your health care provider. This information is not intended to replace advice given to you by your health care provider. Make sure you discuss any questions you have with your health care  provider. Document Revised: 11/23/2018 Document Reviewed: 10/01/2016 Elsevier Patient Education  [place] After These instructions provide you with information about caring for yourself after your procedure. Your health care provider may also give you more specific instructions. Your treatment has been planned according to current medical practices, but problems sometimes occur. Call your health care provider if you have any problems or questions after your procedure. What can I expect after the procedure? After your procedure, you may:  Feel sleepy for several hours.  Feel clumsy and have poor balance for several hours.  Feel forgetful about what happened after the procedure.  Have poor judgment for several hours.  Feel nauseous or vomit.  Have a sore throat if you had  a breathing tube during the procedure. Follow these instructions at home: For at least 24 hours after the procedure:      Have a responsible adult stay with you. It is important to have someone help care for you until you are awake and alert.  Rest as needed.  Do not: ? Participate in activities in which you could fall or become injured. ? Drive. ? Use heavy machinery. ? Drink alcohol. ? Take sleeping pills or medicines that cause drowsiness. ? Make important decisions or sign legal documents. ? Take care of children on your own. Eating and drinking  Follow the diet that is recommended by your health care provider.  If you vomit, drink water, juice, or soup when you can drink without vomiting.  Make sure you have little or no nausea before eating solid foods. General instructions  Take over-the-counter and prescription medicines only as told by your health care provider.  If you have sleep apnea, surgery and certain medicines can increase your risk for breathing problems. Follow instructions from your health care provider about wearing your sleep  device: ? Anytime you are sleeping, including during daytime naps. ? While taking prescription pain medicines, sleeping medicines, or medicines that make you drowsy.  If you smoke, do not smoke without supervision.  Keep all follow-up visits as told by your health care provider. This is important. Contact a health care provider if:  You keep feeling nauseous or you keep vomiting.  You feel light-headed.  You develop a rash.  You have a fever. Get help right away if:  You have trouble breathing. Summary  For several hours after your procedure, you may feel sleepy and have poor judgment.  Have a responsible adult stay with you for at least 24 hours or until you are awake and alert. This information is not intended to replace advice given to you by your health care provider. Make sure you discuss any questions you have with your health care provider. Document Revised: 11/02/2017 Document Reviewed: 11/25/2015 Elsevier Patient Education  2020 [employer]. How to Use Chlorhexidine for Bathing Chlorhexidine gluconate (CHG) is a germ-killing (antiseptic) solution that is used to clean the skin. It can get rid of the bacteria that normally live on the skin and can keep them away for about 24 hours. To clean your skin with CHG, you may be given:  A CHG solution to use in the shower or as part of a sponge bath.  A prepackaged cloth that contains CHG. Cleaning your skin with CHG may help lower the risk for infection:  While you are staying in the intensive care unit of the hospital.  If you have a vascular access, such as a central line, to provide short-term or long-term access to your veins.  If you have a catheter to drain urine from your bladder.  If you are on a ventilator. A ventilator is a machine that helps you breathe by moving air in and out of your lungs.  After surgery. What are the risks? Risks of using CHG include:  A skin reaction.  Hearing loss, if CHG gets in  your ears.  Eye injury, if CHG gets in your eyes and is not rinsed out.  The CHG product catching fire. Make sure that you avoid smoking and flames after applying CHG to your skin. Do not use CHG:  If you have a chlorhexidine allergy or have previously reacted to chlorhexidine.  On babies younger than 81 months of age. How to  use CHG solution  Use CHG only as told by your health care provider, and follow the instructions on the label.  Use the full amount of CHG as directed. Usually, this is one bottle. During a shower Follow these steps when using CHG solution during a shower (unless your health care provider gives you different instructions): 1. Start the shower. 2. Use your normal soap and shampoo to wash your face and hair. 3. Turn off the shower or move out of the shower stream. 4. Pour the CHG onto a clean washcloth. Do not use any type of brush or rough-edged sponge. 5. Starting at your neck, lather your body down to your toes. Make sure you follow these instructions: ? If you will be having surgery, pay special attention to the part of your body where you will be having surgery. Scrub this area for at least 1 minute. ? Do not use CHG on your head or face. If the solution gets into your ears or eyes, rinse them well with water. ? Avoid your genital area. ? Avoid any areas of skin that have broken skin, cuts, or scrapes. ? Scrub your back and under your arms. Make sure to wash skin folds. 6. Let the lather sit on your skin for 1-2 minutes or as long as told by your health care provider. 7. Thoroughly rinse your entire body in the shower. Make sure that all body creases and crevices are rinsed well. 8. Dry off with a clean towel. Do not put any substances on your body afterward--such as powder, lotion, or perfume--unless you are told to do so by your health care provider. Only use lotions that are recommended by the manufacturer. 9. Put on clean clothes or pajamas. 10. If it is  the night before your surgery, sleep in clean sheets.  During a sponge bath Follow these steps when using CHG solution during a sponge bath (unless your health care provider gives you different instructions): 1. Use your normal soap and shampoo to wash your face and hair. 2. Pour the CHG onto a clean washcloth. 3. Starting at your neck, lather your body down to your toes. Make sure you follow these instructions: ? If you will be having surgery, pay special attention to the part of your body where you will be having surgery. Scrub this area for at least 1 minute. ? Do not use CHG on your head or face. If the solution gets into your ears or eyes, rinse them well with water. ? Avoid your genital area. ? Avoid any areas of skin that have broken skin, cuts, or scrapes. ? Scrub your back and under your arms. Make sure to wash skin folds. 4. Let the lather sit on your skin for 1-2 minutes or as long as told by your health care provider. 5. Using a different clean, wet washcloth, thoroughly rinse your entire body. Make sure that all body creases and crevices are rinsed well. 6. Dry off with a clean towel. Do not put any substances on your body afterward--such as powder, lotion, or perfume--unless you are told to do so by your health care provider. Only use lotions that are recommended by the manufacturer. 7. Put on clean clothes or pajamas. 8. If it is the night before your surgery, sleep in clean sheets. How to use CHG prepackaged cloths  Only use CHG cloths as told by your health care provider, and follow the instructions on the label.  Use the CHG cloth on clean, dry skin.  Do not use the CHG cloth on your head or face unless your health care provider tells you to.  When washing with the CHG cloth: ? Avoid your genital area. ? Avoid any areas of skin that have broken skin, cuts, or scrapes. Before surgery Follow these steps when using a CHG cloth to clean before surgery (unless your health  care provider gives you different instructions): 1. Using the CHG cloth, vigorously scrub the part of your body where you will be having surgery. Scrub using a back-and-forth motion for 3 minutes. The area on your body should be completely wet with CHG when you are done scrubbing. 2. Do not rinse. Discard the cloth and let the area air-dry. Do not put any substances on the area afterward, such as powder, lotion, or perfume. 3. Put on clean clothes or pajamas. 4. If it is the night before your surgery, sleep in clean sheets.  For general bathing Follow these steps when using CHG cloths for general bathing (unless your health care provider gives you different instructions). 1. Use a separate CHG cloth for each area of your body. Make sure you wash between any folds of skin and between your fingers and toes. Wash your body in the following order, switching to a new cloth after each step: ? The front of your neck, shoulders, and chest. ? Both of your arms, under your arms, and your hands. ? Your stomach and groin area, avoiding the genitals. ? Your right leg and foot. ? Your left leg and foot. ? The back of your neck, your back, and your buttocks. 2. Do not rinse. Discard the cloth and let the area air-dry. Do not put any substances on your body afterward--such as powder, lotion, or perfume--unless you are told to do so by your health care provider. Only use lotions that are recommended by the manufacturer. 3. Put on clean clothes or pajamas. Contact a health care provider if:  Your skin gets irritated after scrubbing.  You have questions about using your solution or cloth. Get help right away if:  Your eyes become very red or swollen.  Your eyes itch badly.  Your skin itches badly and is red or swollen.  Your hearing changes.  You have trouble seeing.  You have swelling or tingling in your mouth or throat.  You have trouble breathing.  You swallow any  chlorhexidine. Summary  Chlorhexidine gluconate (CHG) is a germ-killing (antiseptic) solution that is used to clean the skin. Cleaning your skin with CHG may help to lower your risk for infection.  You may be given CHG to use for bathing. It may be in a bottle or in a prepackaged cloth to use on your skin. Carefully follow your health care provider's instructions and the instructions on the product label.  Do not use CHG if you have a chlorhexidine allergy.  Contact your health care provider if your skin gets irritated after scrubbing. This information is not intended to replace advice given to you by your health care provider. Make sure you discuss any questions you have with your health care provider. Document Revised: 10/21/2018 Document Reviewed: 07/02/2017 Elsevier Patient Education  2020 [employer].

## 2019-11-11 NOTE — H&P (Signed)
Donna Fitzgerald; [id number]; May 26, 1966   HPI Patient is a 54 year old black female who was referred to my care by Dr. Ellin Saba of oncology for a right axillary lymph node biopsy.  Patient was noted to have diffuse lymphadenopathy and a recent PET scan showed adenopathy in the mediastinum and right axillary regions.  Initial fine-needle aspiration of the right axillary lymph node biopsy was insufficient.  Patient denies any fever or chills.  Patient currently has 0 out of 10 pain. Past Medical History:  Diagnosis Date  . Arthritis   . HLD (hyperlipidemia)   . Hypertension     Past Surgical History:  Procedure Laterality Date  . FINGER SURGERY Right 1995   [place]  . LUMBAR SPINE SURGERY      Family History  Problem Relation Age of Onset  . Hypertension Mother   . Alzheimer's disease Mother   . Diabetes Mother   . Heart disease Father   . Hypertension Father   . Hypertension Sister   . Heart disease Sister   . Heart disease Sister   . Hypertension Sister     Current Outpatient Medications on File Prior to Visit  Medication Sig Dispense Refill  . albuterol (PROVENTIL HFA;VENTOLIN HFA) 108 (90 Base) MCG/ACT inhaler Inhale 2 puffs into the lungs every 4 (four) hours as needed for wheezing or shortness of breath. 1 Inhaler 1  . amitriptyline (ELAVIL) 100 MG tablet TAKE 1 TABLET (100 MG TOTAL) BY MOUTH AT BEDTIME. (Patient taking differently: Take 100 mg by mouth at bedtime. ) 30 tablet 0  . amLODipine (NORVASC) 10 MG tablet Take 1 tablet (10 mg total) by mouth daily. 90 tablet 3  . aspirin 81 MG tablet Take 81 mg by mouth daily.    . enalapril (VASOTEC) 20 MG tablet TAKE 1 TABLET BY MOUTH EVERY DAY (Patient taking differently: Take 20 mg by mouth daily. ) 90 tablet 0  . ibuprofen (ADVIL) 600 MG tablet Take 600 mg by mouth every 6 (six) hours as needed.     . metoprolol (LOPRESSOR) 50 MG tablet TAKE 1 TABLET BY MOUTH TWICE A DAY (Patient taking differently: Take 50 mg by mouth 2  (two) times daily. ) 60 tablet 0  . omeprazole (PRILOSEC) 20 MG capsule Take 1 capsule (20 mg total) by mouth every morning. 30 capsule 3  . pravastatin (PRAVACHOL) 80 MG tablet Take 80 mg by mouth daily.    . Tiotropium Bromide-Olodaterol (STIOLTO RESPIMAT) 2.5-2.5 MCG/ACT AERS Inhale 2 puffs into the lungs daily. 4 g 0  . gabapentin (NEURONTIN) 300 MG capsule Take 1 capsule (300 mg total) by mouth 2 (two) times daily for 30 days. 60 capsule 0   No current facility-administered medications on file prior to visit.    No Known Allergies  Social History   Substance and Sexual Activity  Alcohol Use Yes   Comment: ocassional on the weekends    Social History   Tobacco Use  Smoking Status Current Every Day Smoker  . Packs/day: 0.50  . Years: 30.00  . Pack years: 15.00  . Types: Cigarettes  Smokeless Tobacco Never Used    Review of Systems  Constitutional: Positive for malaise/fatigue.  HENT: Negative.   Eyes: Negative.   Respiratory: Negative.   Cardiovascular: Negative.   Gastrointestinal: Positive for heartburn.  Musculoskeletal: Positive for back pain.  Skin: Negative.   Neurological: Negative.   Endo/Heme/Allergies: Negative.   Psychiatric/Behavioral: Negative.     Objective   Vitals:   11/01/19 1517  BP: 98/61  Pulse: 82  Resp: 12  Temp: 98.1 F (36.7 C)  SpO2: 92%    Physical Exam Vitals reviewed.  Constitutional:      Appearance: Normal appearance. She is obese. She is not ill-appearing.  HENT:     Head: Normocephalic and atraumatic.  Cardiovascular:     Rate and Rhythm: Normal rate and regular rhythm.     Heart sounds: Normal heart sounds. No murmur. No friction rub. No gallop.   Pulmonary:     Effort: Pulmonary effort is normal. No respiratory distress.     Breath sounds: Normal breath sounds. No stridor. No wheezing, rhonchi or rales.  Skin:    General: Skin is warm and dry.  Neurological:     Mental Status: She is alert and oriented to  person, place, and time.   Lymphatics: Difficult to palpate any specific lymphadenopathy in the right axilla due to body habitus.  No supraclavicular lymphadenopathy noted.  PET scan images personally reviewed  Assessment  Right axillary lymphadenopathy, generalized lymphadenopathy Plan   Patient is scheduled for a right axillary lymph node biopsy on 11/16/2019.  The risks and benefits of the procedure including bleeding and infection were fully explained to the patient, who gave informed consent. 

## 2019-11-13 ENCOUNTER — Other Ambulatory Visit: Payer: Self-pay

## 2019-11-13 DIAGNOSIS — R0600 Dyspnea, unspecified: Secondary | ICD-10-CM

## 2019-11-14 ENCOUNTER — Encounter (HOSPITAL_COMMUNITY): Payer: Self-pay

## 2019-11-14 ENCOUNTER — Other Ambulatory Visit: Payer: Self-pay

## 2019-11-14 ENCOUNTER — Other Ambulatory Visit (HOSPITAL_COMMUNITY)
Admission: RE | Admit: 2019-11-14 | Discharge: 2019-11-14 | Disposition: A | Discharge status: Home / Self Care | Payer: Commercial Managed Care - PPO | Source: Ambulatory Visit | Attending: General Surgery | Admitting: General Surgery

## 2019-11-14 ENCOUNTER — Encounter (HOSPITAL_COMMUNITY)
Admission: RE | Admit: 2019-11-14 | Discharge: 2019-11-14 | Disposition: A | Discharge status: Home / Self Care | Payer: Commercial Managed Care - PPO | Source: Ambulatory Visit | Attending: General Surgery | Admitting: General Surgery

## 2019-11-14 DIAGNOSIS — Z20822 Contact with and (suspected) exposure to covid-19: Secondary | ICD-10-CM | POA: Insufficient documentation

## 2019-11-14 DIAGNOSIS — Z01818 Encounter for other preprocedural examination: Secondary | ICD-10-CM | POA: Insufficient documentation

## 2019-11-14 DIAGNOSIS — R9431 Abnormal electrocardiogram [ECG] [EKG]: Secondary | ICD-10-CM | POA: Insufficient documentation

## 2019-11-14 LAB — SARS CORONAVIRUS 2 (TAT 6-24 HRS): SARS Coronavirus 2: NEGATIVE

## 2019-11-14 MED ORDER — STIOLTO RESPIMAT 2.5-2.5 MCG/ACT IN AERS: 2.0000 | INHALATION_SPRAY | Freq: Every day | RESPIRATORY_TRACT | 5 refills | Status: DC

## 2019-11-15 ENCOUNTER — Other Ambulatory Visit (HOSPITAL_COMMUNITY): Payer: Self-pay | Admitting: *Deleted

## 2019-11-15 DIAGNOSIS — R590 Localized enlarged lymph nodes: Secondary | ICD-10-CM

## 2019-11-16 ENCOUNTER — Ambulatory Visit (HOSPITAL_COMMUNITY)
Admission: RE | Admit: 2019-11-16 | Discharge: 2019-11-16 | Disposition: A | Discharge status: Home / Self Care | Payer: Commercial Managed Care - PPO | Source: Ambulatory Visit | Attending: General Surgery | Admitting: General Surgery

## 2019-11-16 ENCOUNTER — Encounter (HOSPITAL_COMMUNITY): Payer: Self-pay | Admitting: General Surgery

## 2019-11-16 ENCOUNTER — Encounter (HOSPITAL_COMMUNITY)
Admission: RE | Disposition: A | Discharge status: Home / Self Care | Payer: Self-pay | Source: Ambulatory Visit | Attending: General Surgery

## 2019-11-16 ENCOUNTER — Other Ambulatory Visit: Payer: Self-pay

## 2019-11-16 ENCOUNTER — Ambulatory Visit (HOSPITAL_COMMUNITY): Payer: Commercial Managed Care - PPO | Admitting: Anesthesiology

## 2019-11-16 DIAGNOSIS — I10 Essential (primary) hypertension: Secondary | ICD-10-CM | POA: Insufficient documentation

## 2019-11-16 DIAGNOSIS — M1990 Unspecified osteoarthritis, unspecified site: Secondary | ICD-10-CM | POA: Diagnosis not present

## 2019-11-16 DIAGNOSIS — Z8616 Personal history of COVID-19: Secondary | ICD-10-CM | POA: Diagnosis not present

## 2019-11-16 DIAGNOSIS — E785 Hyperlipidemia, unspecified: Secondary | ICD-10-CM | POA: Insufficient documentation

## 2019-11-16 DIAGNOSIS — R591 Generalized enlarged lymph nodes: Secondary | ICD-10-CM | POA: Diagnosis not present

## 2019-11-16 DIAGNOSIS — F17210 Nicotine dependence, cigarettes, uncomplicated: Secondary | ICD-10-CM | POA: Insufficient documentation

## 2019-11-16 DIAGNOSIS — Z7982 Long term (current) use of aspirin: Secondary | ICD-10-CM | POA: Diagnosis not present

## 2019-11-16 DIAGNOSIS — Z79899 Other long term (current) drug therapy: Secondary | ICD-10-CM | POA: Insufficient documentation

## 2019-11-16 DIAGNOSIS — K219 Gastro-esophageal reflux disease without esophagitis: Secondary | ICD-10-CM | POA: Insufficient documentation

## 2019-11-16 HISTORY — PX: AXILLARY LYMPH NODE BIOPSY: SHX5737

## 2019-11-16 LAB — POCT I-STAT, CHEM 8
BUN: 18 mg/dL (ref 6–20)
Calcium, Ion: 1.21 mmol/L (ref 1.15–1.40)
Chloride: 107 mmol/L (ref 98–111)
Creatinine, Ser: 0.7 mg/dL (ref 0.44–1.00)
Glucose, Bld: 115 mg/dL — ABNORMAL HIGH (ref 70–99)
HCT: 38 % (ref 36.0–46.0)
Hemoglobin: 12.9 g/dL (ref 12.0–15.0)
Potassium: 3.8 mmol/L (ref 3.5–5.1)
Sodium: 140 mmol/L (ref 135–145)
TCO2: 24 mmol/L (ref 22–32)

## 2019-11-16 SURGERY — AXILLARY LYMPH NODE BIOPSY
Anesthesia: General | Site: Axilla | Laterality: Right

## 2019-11-16 MED ORDER — MIDAZOLAM HCL 5 MG/5ML IJ SOLN
INTRAMUSCULAR | Status: DC | PRN
  Administered 2019-11-16: 2 mg via INTRAVENOUS

## 2019-11-16 MED ORDER — PROPOFOL 10 MG/ML IV BOLUS
INTRAVENOUS | Status: AC
  Filled 2019-11-16: qty 20

## 2019-11-16 MED ORDER — KETOROLAC TROMETHAMINE 30 MG/ML IJ SOLN
30.0000 mg | Freq: Once | INTRAMUSCULAR | Status: AC
  Administered 2019-11-16: 30 mg via INTRAVENOUS
  Filled 2019-11-16: qty 1

## 2019-11-16 MED ORDER — DEXAMETHASONE SODIUM PHOSPHATE 4 MG/ML IJ SOLN
INTRAMUSCULAR | Status: DC | PRN
  Administered 2019-11-16: 10 mg via INTRAVENOUS

## 2019-11-16 MED ORDER — PROPOFOL 10 MG/ML IV BOLUS
INTRAVENOUS | Status: DC | PRN
  Administered 2019-11-16: 200 mg via INTRAVENOUS

## 2019-11-16 MED ORDER — SUCCINYLCHOLINE CHLORIDE 200 MG/10ML IV SOSY: PREFILLED_SYRINGE | INTRAVENOUS | Status: DC | PRN

## 2019-11-16 MED ORDER — BACITRACIN-NEOMYCIN-POLYMYXIN 400-5-5000 EX OINT
TOPICAL_OINTMENT | CUTANEOUS | Status: DC | PRN
  Administered 2019-11-16: 1 via TOPICAL

## 2019-11-16 MED ORDER — MEPERIDINE HCL 50 MG/ML IJ SOLN: 6.2500 mg | INTRAMUSCULAR | Status: DC | PRN

## 2019-11-16 MED ORDER — BACITRACIN-NEOMYCIN-POLYMYXIN 400-5-5000 EX OINT
TOPICAL_OINTMENT | CUTANEOUS | Status: AC
  Filled 2019-11-16: qty 1

## 2019-11-16 MED ORDER — ONDANSETRON HCL 4 MG/2ML IJ SOLN
INTRAMUSCULAR | Status: AC
  Filled 2019-11-16: qty 2

## 2019-11-16 MED ORDER — LIDOCAINE 2% (20 MG/ML) 5 ML SYRINGE
INTRAMUSCULAR | Status: AC
  Filled 2019-11-16: qty 5

## 2019-11-16 MED ORDER — METOCLOPRAMIDE HCL 5 MG/ML IJ SOLN
INTRAMUSCULAR | Status: AC
  Filled 2019-11-16: qty 2

## 2019-11-16 MED ORDER — CHLORHEXIDINE GLUCONATE CLOTH 2 % EX PADS: 6.0000 | MEDICATED_PAD | Freq: Once | CUTANEOUS | Status: DC

## 2019-11-16 MED ORDER — ROCURONIUM BROMIDE 100 MG/10ML IV SOLN
INTRAVENOUS | Status: DC | PRN
  Administered 2019-11-16: 50 mg via INTRAVENOUS

## 2019-11-16 MED ORDER — PROPOFOL 10 MG/ML IV BOLUS: INTRAVENOUS | Status: DC | PRN

## 2019-11-16 MED ORDER — EPHEDRINE 5 MG/ML INJ
INTRAVENOUS | Status: AC
  Filled 2019-11-16: qty 10

## 2019-11-16 MED ORDER — CEFAZOLIN SODIUM-DEXTROSE 2-4 GM/100ML-% IV SOLN
2.0000 g | INTRAVENOUS | Status: AC
  Administered 2019-11-16: 2 g via INTRAVENOUS
  Filled 2019-11-16: qty 100

## 2019-11-16 MED ORDER — PHENYLEPHRINE HCL (PRESSORS) 10 MG/ML IV SOLN
INTRAVENOUS | Status: DC | PRN
  Administered 2019-11-16 (×2): 40 ug via INTRAVENOUS
  Administered 2019-11-16: 80 ug via INTRAVENOUS
  Administered 2019-11-16 (×2): 40 ug via INTRAVENOUS

## 2019-11-16 MED ORDER — DEXAMETHASONE SODIUM PHOSPHATE 10 MG/ML IJ SOLN
INTRAMUSCULAR | Status: AC
  Filled 2019-11-16: qty 1

## 2019-11-16 MED ORDER — SUCCINYLCHOLINE CHLORIDE 200 MG/10ML IV SOSY
PREFILLED_SYRINGE | INTRAVENOUS | Status: AC
  Filled 2019-11-16: qty 10

## 2019-11-16 MED ORDER — ROCURONIUM BROMIDE 10 MG/ML (PF) SYRINGE
PREFILLED_SYRINGE | INTRAVENOUS | Status: AC
  Filled 2019-11-16: qty 10

## 2019-11-16 MED ORDER — EPHEDRINE SULFATE 50 MG/ML IJ SOLN
INTRAMUSCULAR | Status: DC | PRN
  Administered 2019-11-16 (×4): 10 mg via INTRAVENOUS

## 2019-11-16 MED ORDER — 0.9 % SODIUM CHLORIDE (POUR BTL) OPTIME
TOPICAL | Status: DC | PRN
  Administered 2019-11-16: 1000 mL

## 2019-11-16 MED ORDER — HYDROCODONE-ACETAMINOPHEN 5-325 MG PO TABS: 1.0000 | ORAL_TABLET | ORAL | 0 refills | Status: DC | PRN

## 2019-11-16 MED ORDER — BUPIVACAINE LIPOSOME 1.3 % IJ SUSP
INTRAMUSCULAR | Status: AC
  Filled 2019-11-16: qty 20

## 2019-11-16 MED ORDER — METOCLOPRAMIDE HCL 5 MG/ML IJ SOLN
INTRAMUSCULAR | Status: DC | PRN
  Administered 2019-11-16: 10 mg via INTRAVENOUS

## 2019-11-16 MED ORDER — FENTANYL CITRATE (PF) 100 MCG/2ML IJ SOLN
INTRAMUSCULAR | Status: DC | PRN
  Administered 2019-11-16: 100 ug via INTRAVENOUS

## 2019-11-16 MED ORDER — SUGAMMADEX SODIUM 200 MG/2ML IV SOLN
INTRAVENOUS | Status: DC | PRN
  Administered 2019-11-16: 200 mg via INTRAVENOUS

## 2019-11-16 MED ORDER — IPRATROPIUM-ALBUTEROL 0.5-2.5 (3) MG/3ML IN SOLN
3.0000 mL | RESPIRATORY_TRACT | Status: DC
  Administered 2019-11-16 (×2): 3 mL via RESPIRATORY_TRACT
  Filled 2019-11-16: qty 3

## 2019-11-16 MED ORDER — HYDROMORPHONE HCL 1 MG/ML IJ SOLN: 0.2500 mg | INTRAMUSCULAR | Status: DC | PRN

## 2019-11-16 MED ORDER — FENTANYL CITRATE (PF) 100 MCG/2ML IJ SOLN
INTRAMUSCULAR | Status: AC
  Filled 2019-11-16: qty 2

## 2019-11-16 MED ORDER — ONDANSETRON HCL 4 MG/2ML IJ SOLN: 4.0000 mg | Freq: Once | INTRAMUSCULAR | Status: DC | PRN

## 2019-11-16 MED ORDER — MIDAZOLAM HCL 2 MG/2ML IJ SOLN
INTRAMUSCULAR | Status: AC
  Filled 2019-11-16: qty 2

## 2019-11-16 MED ORDER — LACTATED RINGERS IV SOLN
Freq: Once | INTRAVENOUS | Status: AC
  Administered 2019-11-16: 1000 mL via INTRAVENOUS

## 2019-11-16 MED ORDER — BUPIVACAINE LIPOSOME 1.3 % IJ SUSP
INTRAMUSCULAR | Status: DC | PRN
  Administered 2019-11-16: 11 mL

## 2019-11-16 MED ORDER — IPRATROPIUM-ALBUTEROL 0.5-2.5 (3) MG/3ML IN SOLN
RESPIRATORY_TRACT | Status: AC
  Filled 2019-11-16: qty 3

## 2019-11-16 MED ORDER — PHENYLEPHRINE 40 MCG/ML (10ML) SYRINGE FOR IV PUSH (FOR BLOOD PRESSURE SUPPORT)
PREFILLED_SYRINGE | INTRAVENOUS | Status: AC
  Filled 2019-11-16: qty 10

## 2019-11-16 MED ORDER — LACTATED RINGERS IV SOLN: INTRAVENOUS | Status: DC | PRN

## 2019-11-16 MED ORDER — LIDOCAINE 2% (20 MG/ML) 5 ML SYRINGE
INTRAMUSCULAR | Status: DC | PRN
  Administered 2019-11-16: 30 mg via INTRAVENOUS

## 2019-11-16 SURGICAL SUPPLY — 32 items
APPLIER CLIP 9.375 SM OPEN (CLIP) ×3
BANDAGE STRIP 1X3 FLEXIBLE (GAUZE/BANDAGES/DRESSINGS) ×3 IMPLANT
BLADE SURG 15 STRL LF DISP TIS (BLADE) ×1 IMPLANT
BLADE SURG 15 STRL SS (BLADE) ×3
CHLORAPREP W/TINT 26 (MISCELLANEOUS) ×3 IMPLANT
CLIP APPLIE 9.375 SM OPEN (CLIP) ×1 IMPLANT
CLOTH BEACON ORANGE TIMEOUT ST (SAFETY) ×3 IMPLANT
CONT SPEC 4OZ CLIKSEAL STRL BL (MISCELLANEOUS) ×3 IMPLANT
COVER LIGHT HANDLE STERIS (MISCELLANEOUS) ×6 IMPLANT
COVER WAND RF STERILE (DRAPES) ×3 IMPLANT
DERMABOND ADVANCED (GAUZE/BANDAGES/DRESSINGS) ×2
DERMABOND ADVANCED .7 DNX12 (GAUZE/BANDAGES/DRESSINGS) ×1 IMPLANT
ELECT REM PT RETURN 9FT ADLT (ELECTROSURGICAL) ×3
ELECTRODE REM PT RTRN 9FT ADLT (ELECTROSURGICAL) ×1 IMPLANT
GLOVE BIO SURGEON STRL SZ7 (GLOVE) ×3 IMPLANT
GLOVE BIOGEL PI IND STRL 7.0 (GLOVE) ×2 IMPLANT
GLOVE BIOGEL PI INDICATOR 7.0 (GLOVE) ×4
GLOVE SURG SS PI 7.5 STRL IVOR (GLOVE) ×3 IMPLANT
GOWN STRL REUS W/TWL LRG LVL3 (GOWN DISPOSABLE) ×6 IMPLANT
INST SET MINOR GENERAL (KITS) ×3 IMPLANT
KIT TURNOVER KIT A (KITS) ×3 IMPLANT
MANIFOLD NEPTUNE II (INSTRUMENTS) ×3 IMPLANT
NEEDLE HYPO 18GX1.5 BLUNT FILL (NEEDLE) ×3 IMPLANT
NEEDLE HYPO 22GX1.5 SAFETY (NEEDLE) ×3 IMPLANT
PACK MINOR (CUSTOM PROCEDURE TRAY) ×3 IMPLANT
PAD ARMBOARD 7.5X6 YLW CONV (MISCELLANEOUS) ×3 IMPLANT
SET BASIN LINEN APH (SET/KITS/TRAYS/PACK) ×3 IMPLANT
SPONGE LAP 18X18 RF (DISPOSABLE) ×3 IMPLANT
SUT MNCRL AB 4-0 PS2 18 (SUTURE) ×3 IMPLANT
SUT VIC AB 3-0 SH 27 (SUTURE) ×3
SUT VIC AB 3-0 SH 27X BRD (SUTURE) ×1 IMPLANT
SYR 20ML LL LF (SYRINGE) ×6 IMPLANT

## 2019-11-16 NOTE — Anesthesia Postprocedure Evaluation (Signed)
Anesthesia Post Note  Patient: Donna Fitzgerald  Procedure(s) Performed: AXILLARY LYMPH NODE BIOPSY (Right Axilla)  Patient location during evaluation: PACU Anesthesia Type: General Level of consciousness: awake and alert Pain management: pain level controlled Vital Signs Assessment: post-procedure vital signs reviewed and stable Respiratory status: spontaneous breathing, nonlabored ventilation, respiratory function stable and patient connected to nasal cannula oxygen Cardiovascular status: blood pressure returned to baseline and stable Postop Assessment: no apparent nausea or vomiting Anesthetic complications: no     Last Vitals:  Vitals:   11/16/19 1030 11/16/19 1045  BP: 101/71 102/60  Pulse: 83 81  Resp: 18 16  Temp: 36.6 C 36.6 C  SpO2: 100% 97%    Last Pain:  Vitals:   11/16/19 1045  TempSrc: Oral  PainSc: 0-No pain                 Tomothy Eddins

## 2019-11-16 NOTE — Transfer of Care (Signed)
Immediate Anesthesia Transfer of Care Note  Patient: Donna Fitzgerald  Procedure(s) Performed: AXILLARY LYMPH NODE BIOPSY (Right Axilla)  Patient Location: PACU  Anesthesia Type:General  Level of Consciousness: awake, alert , oriented and sedated  Airway & Oxygen Therapy: Patient Spontanous Breathing and Patient connected to face mask oxygen  Post-op Assessment: Report given to RN, Post -op Vital signs reviewed and stable and Patient moving all extremities X 4  Post vital signs: Reviewed and stable  Last Vitals:  Vitals Value Taken Time  BP 115/78 11/16/19 0832  Temp 36.9 C 11/16/19 0830  Pulse 87 11/16/19 0839  Resp 26 11/16/19 0839  SpO2 100 % 11/16/19 0839  Vitals shown include unvalidated device data.  Last Pain:  Vitals:   11/16/19 0830  TempSrc:   PainSc: 0-No pain      Patients Stated Pain Goal: 7 (11/16/19 0650)  Complications: No apparent anesthesia complications

## 2019-11-16 NOTE — Interval H&P Note (Signed)
History and Physical Interval Note:  11/16/2019 7:07 AM  Donna Fitzgerald  has presented today for surgery, with the diagnosis of Mediastinal lymphadenopathy.  The various methods of treatment have been discussed with the patient and family. After consideration of risks, benefits and other options for treatment, the patient has consented to  Procedure(s): AXILLARY LYMPH NODE BIOPSY (Right) as a surgical intervention.  The patient's history has been reviewed, patient examined, no change in status, stable for surgery.  I have reviewed the patient's chart and labs.  Questions were answered to the patient's satisfaction.     Franky Macho

## 2019-11-16 NOTE — Op Note (Signed)
Patient:  Donna Fitzgerald  DOB:  Nov 11, 1965  MRN:  [id number]   Preop Diagnosis: Generalized lymphadenopathy  Postop Diagnosis: Same  Procedure: Right axillary lymph node biopsy  Surgeon: Franky Macho, MD  Anes: General endotracheal  Indications: Patient is a 54 year old black female who was referred to my care by oncology for a right axillary lymph node biopsy.  She suffers from generalized lymphadenopathy.  The risks and benefits of the procedure including bleeding, infection, and right arm pain were fully explained to the patient, who gave informed consent.  Procedure note: The patient was placed in supine position.  After induction of general endotracheal anesthesia, the right axilla was prepped and draped using the usual sterile technique with ChloraPrep.  Surgical site confirmation was performed.  Incision was made in the right axilla.  The dissection was taken down to the right axilla and a cluster of matted lymph nodes was found.  They were excised and any bleeding was controlled using small clips.  The axillary lymph nodes were then sent to pathology for flow cytometry.  The subcutaneous layer was reapproximated using 3-0 Vicryl interrupted suture.  Exparel was instilled into the surrounding wound.  A small superficial abrasion of the skin was noted just inferior to the incision line.  Triple antibiotic ointment was applied.  The skin was closed using a 4-0 Monocryl subcuticular suture.  Dermabond was applied.  All tape and needle counts were correct at the end of the procedure.  The patient was extubated in the operating room and transferred to PACU in stable condition.  Complications: None  EBL: Minimal  Specimen: Right axillary lymph nodes

## 2019-11-16 NOTE — Discharge Instructions (Signed)
   Monitored Anesthesia Care, Care After These instructions provide you with information about caring for yourself after your procedure. Your health care provider may also give you more specific instructions. Your treatment has been planned according to current medical practices, but problems sometimes occur. Call your health care provider if you have any problems or questions after your procedure. What can I expect after the procedure? After your procedure, you may:  Feel sleepy for several hours.  Feel clumsy and have poor balance for several hours.  Feel forgetful about what happened after the procedure.  Have poor judgment for several hours.  Feel nauseous or vomit.  Have a sore throat if you had a breathing tube during the procedure. Follow these instructions at home: For at least 24 hours after the procedure:      Have a responsible adult stay with you. It is important to have someone help care for you until you are awake and alert.  Rest as needed.  Do not: ? Participate in activities in which you could fall or become injured. ? Drive. ? Use heavy machinery. ? Drink alcohol. ? Take sleeping pills or medicines that cause drowsiness. ? Make important decisions or sign legal documents. ? Take care of children on your own. Eating and drinking  Follow the diet that is recommended by your health care provider.  If you vomit, drink water, juice, or soup when you can drink without vomiting.  Make sure you have little or no nausea before eating solid foods. General instructions  Take over-the-counter and prescription medicines only as told by your health care provider.  If you have sleep apnea, surgery and certain medicines can increase your risk for breathing problems. Follow instructions from your health care provider about wearing your sleep device: ? Anytime you are sleeping, including during daytime naps. ? While taking prescription pain medicines, sleeping  medicines, or medicines that make you drowsy.  If you smoke, do not smoke without supervision.  Keep all follow-up visits as told by your health care provider. This is important. Contact a health care provider if:  You keep feeling nauseous or you keep vomiting.  You feel light-headed.  You develop a rash.  You have a fever. Get help right away if:  You have trouble breathing. Summary  For several hours after your procedure, you may feel sleepy and have poor judgment.  Have a responsible adult stay with you for at least 24 hours or until you are awake and alert. This information is not intended to replace advice given to you by your health care provider. Make sure you discuss any questions you have with your health care provider. Document Revised: 11/02/2017 Document Reviewed: 11/25/2015 Elsevier Patient Education  2020 Elsevier Inc.  

## 2019-11-16 NOTE — Anesthesia Procedure Notes (Signed)
Procedure Name: Intubation Date/Time: 11/16/2019 7:33 AM Performed by: Alvy Bimler, CRNA Pre-anesthesia Checklist: Patient identified, Patient being monitored, Timeout performed, Emergency Drugs available and Suction available Patient Re-evaluated:Patient Re-evaluated prior to induction Oxygen Delivery Method: Circle System Utilized Preoxygenation: Pre-oxygenation with 100% oxygen Induction Type: IV induction Ventilation: Mask ventilation without difficulty Laryngoscope Size: Mac and 3 Grade View: Grade II Tube type: Oral Tube size: 7.0 mm Number of attempts: 1 Airway Equipment and Method: stylet Placement Confirmation: ETT inserted through vocal cords under direct vision,  positive ETCO2 and breath sounds checked- equal and bilateral Secured at: 21 cm Tube secured with: Tape Dental Injury: Teeth and Oropharynx as per pre-operative assessment

## 2019-11-16 NOTE — Anesthesia Preprocedure Evaluation (Addendum)
Anesthesia Evaluation  Patient identified by MRN, date of birth, ID band Patient awake    Reviewed: Allergy & Precautions, NPO status , Patient's Chart, lab work & pertinent test results, reviewed documented beta blocker date and time   History of Anesthesia Complications Negative for: history of anesthetic complications  Airway Mallampati: II  TM Distance: >3 FB Neck ROM: Full    Dental  (+) Missing, Dental Advisory Given   Pulmonary shortness of breath and with exertion, pneumonia (Covid pneumonia-06/2019), resolved, Current Smoker and Patient abstained from smoking.,  Mediastinal lymphadenopathy   Pulmonary exam normal breath sounds clear to auscultation       Cardiovascular Exercise Tolerance: Good hypertension, Pt. on medications and Pt. on home beta blockers + DOE  Normal cardiovascular exam Rhythm:Regular Rate:Normal  14-Nov-2019 13:20:41 [facility] ROUTINE RECORD Normal sinus rhythm Possible Left atrial enlargement Borderline ECG Compared to previous tracing there is a TWI in V2 Confirmed by Zoila Shutter [phone]) on 11/14/2019 5:52:48 PM   Neuro/Psych negative neurological ROS  negative psych ROS   GI/Hepatic GERD  Medicated and Controlled,(+)     substance abuse  alcohol use,   Endo/Other  negative endocrine ROS  Renal/GU      Musculoskeletal  (+) Arthritis ,   Abdominal   Peds  Hematology negative hematology ROS (+)   Anesthesia Other Findings IMPRESSION: 1. No demonstrable pulmonary embolus. No thoracic aortic aneurysm or dissection. Scattered foci of coronary artery calcification noted.  2. Multifocal adenopathy, slightly increased from prior study. Etiology for adenopathy uncertain. Neoplastic etiology must be of concern.  3. Areas of atelectatic change. Mosaic appearance at several levels most likely represents a degree of small airways obstructive disease. No  consolidation or ground-glass type opacity evident. No pleural effusions appreciable.  4. Mild reflux of contrast into the inferior vena cava and hepatic veins may be indicative of a degree of increase in right heart pressure.  5.  Hepatic steatosis.   Electronically Signed   By: Bretta Bang III M.D.   On: 07/12/2019 19:31  Reproductive/Obstetrics                           Anesthesia Physical Anesthesia Plan  ASA: III  Anesthesia Plan: General   Post-op Pain Management:    Induction: Intravenous  PONV Risk Score and Plan: 3 and Ondansetron, Dexamethasone, Midazolam and Metaclopromide  Airway Management Planned: Oral ETT  Additional Equipment:   Intra-op Plan:   Post-operative Plan: Extubation in OR  Informed Consent: I have reviewed the patients History and Physical, chart, labs and discussed the procedure including the risks, benefits and alternatives for the proposed anesthesia with the patient or authorized representative who has indicated his/her understanding and acceptance.     Dental advisory given  Plan Discussed with: CRNA and Surgeon  Anesthesia Plan Comments:       Anesthesia Quick Evaluation

## 2019-11-16 NOTE — Progress Notes (Signed)
  November 16, 2019  Patient: Donna Fitzgerald  Date of Birth: May 26, 1966  Date of Visit: 11/16/2019    To Whom It May Concern:  [name] was seen and treated in Day Surgery on November 16, 2019. [name]  may return to work on 11/21/2019.  Sincerely,  Dr Loraine Leriche Jari Favre RN

## 2019-11-21 LAB — SURGICAL PATHOLOGY

## 2019-11-23 ENCOUNTER — Inpatient Hospital Stay (HOSPITAL_COMMUNITY): Payer: Commercial Managed Care - PPO | Admitting: Nurse Practitioner

## 2019-11-23 ENCOUNTER — Other Ambulatory Visit: Payer: Self-pay

## 2019-11-23 NOTE — Assessment & Plan Note (Signed)
1.  Mediastinal and right axillary adenopathy: -Patient had CT of the chest on 10/26/2018 which showed multifocal pneumonia and multifocal mediastinal adenopathy.  She was treated with antibiotics at that time. -CT chest PE protocol on 07/12/2019 showed lymphadenopathy anterior to the Carina, AP window, peritracheal, right hilar and several prominent subcarinal lymph nodes.  Largest lymph node was measuring 2.4 cm.  Lung showed mosaic attenuation at several sites. -She was evaluated by Dr. Silvestre Gunner and PET CT scan was done on 09/07/2019.  This showed 1.2 cm prevascular lymph node, right paratracheal lymph node, subcarinal lymph node, right hilar lymph node.  Within the right axillary there is prominent lymph nodes measuring 0.9 cm short axis with SUV of 5.8.  There is increased uptake in the distal esophagus, nonspecific with SUV of 5.32.  1.7 cm peripancreatic lymph node with SUV max of 2.68. -Patient denies any fevers or night sweats.  She lost 8 pounds however was trying to lose weight. -She had right axillary lymph node core biopsy on 09/21/2019 which showed limited lymphoid tissue.  Lymph node tissue primarily composed of small round to slightly irregular lymphocytes mixed with variable number of plasma cells.  No well-formed granuloma or malignancy noted.  Insufficient cells for flow cytometry. 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  She smokes 8 to 10 cigarettes a day for the last 25 years.  She does report brownish sputum in small amounts. -High-resolution CT scan is being planned for sarcoidosis work-up. -We will check LDH level and an ACE level today.  I do not feel any axillary adenopathy. -If LDH is elevated we will plan for excision biopsy of the right axillary lymph node or core biopsy of the mediastinal lymph node by bronchoscopy.  2.  Distal esophageal uptake: -PET CT scan showed increased uptake in the distal esophagus with SUV of 5.32 with no mass. -She is seeing GI  next week for possible endoscopy.  3.  Anemia: -She has normocytic anemia intermittently. -We will check CBC, ferritin and iron panel B12 and folic acid.

## 2019-11-23 NOTE — Progress Notes (Unsigned)
[facility] [address], [place] [address]   CLINIC:  Medical Oncology/Hematology  PCP:  [facility] [address] [place] [address] [phone]   REASON FOR VISIT:  Follow-up for ***  CURRENT THERAPY: ***  BRIEF ONCOLOGIC HISTORY:  Oncology History   No history exists.     CANCER STAGING: Cancer Staging No matching staging information was found for the patient.   INTERVAL HISTORY:  [name] 54 y.o. female returns for routine follow-up and consideration for next cycle of chemotherapy.   Due for cycle #*** of *** today.   Overall, she tells me she has been feeling pretty well. Energy levels ***; appetite ***.   Overall, she feels ready for next cycle of chemo today.     REVIEW OF SYSTEMS:  Review of Systems - Oncology   PAST MEDICAL/SURGICAL HISTORY:  Past Medical History:  Diagnosis Date  . Arthritis   . HLD (hyperlipidemia)   . Hypertension    Past Surgical History:  Procedure Laterality Date  . AXILLARY LYMPH NODE BIOPSY Right 11/16/2019   Procedure: AXILLARY LYMPH NODE BIOPSY;  Surgeon: Franky Macho, MD;  Location: [facility];  Service: General;  Laterality: Right;  . FINGER SURGERY Right 1995   [place]  . LUMBAR SPINE SURGERY       SOCIAL HISTORY:  Social History   Socioeconomic History  . Marital status: Married    Spouse name: Earvin Hansen  . Number of children: 1  . Years of education: Not on file  . Highest education level: Not on file  Occupational History  . Occupation: CNA  Tobacco Use  . Smoking status: Current Every Day Smoker    Packs/day: 0.50    Years: 30.00    Pack years: 15.00    Types: Cigarettes  . Smokeless tobacco: Never Used  Substance and Sexual Activity  . Alcohol use: Yes    Comment: ocassional on the weekends  . Drug use: No  . Sexual activity: Not Currently  Other Topics Concern  . Not on file  Social History Narrative  . Not on file   Social Determinants  of Health   Financial Resource Strain: Low Risk   . Difficulty of Paying Living Expenses: Not hard at all  Food Insecurity: No Food Insecurity  . Worried About Programme researcher, broadcasting/film/video in the Last Year: Never true  . Ran Out of Food in the Last Year: Never true  Transportation Needs: No Transportation Needs  . Lack of Transportation (Medical): No  . Lack of Transportation (Non-Medical): No  Physical Activity: Inactive  . Days of Exercise per Week: 0 days  . Minutes of Exercise per Session: 0 min  Stress: No Stress Concern Present  . Feeling of Stress : Not at all  Social Connections: Slightly Isolated  . Frequency of Communication with Friends and Family: Three times a week  . Frequency of Social Gatherings with Friends and Family: Three times a week  . Attends Religious Services: More than 4 times per year  . Active Member of Clubs or Organizations: No  . Attends Banker Meetings: Never  . Marital Status: Married  Catering manager Violence: Not At Risk  . Fear of Current or Ex-Partner: No  . Emotionally Abused: No  . Physically Abused: No  . Sexually Abused: No    FAMILY HISTORY:  Family History  Problem Relation Age of Onset  . Hypertension Mother   . Alzheimer's disease Mother   .  Diabetes Mother   . Heart disease Father   . Hypertension Father   . Hypertension Sister   . Heart disease Sister   . Heart disease Sister   . Hypertension Sister     CURRENT MEDICATIONS:  Outpatient Encounter Medications as of 11/23/2019  Medication Sig  . albuterol (PROVENTIL HFA;VENTOLIN HFA) 108 (90 Base) MCG/ACT inhaler Inhale 2 puffs into the lungs every 4 (four) hours as needed for wheezing or shortness of breath.  Marland Kitchen amitriptyline (ELAVIL) 100 MG tablet TAKE 1 TABLET (100 MG TOTAL) BY MOUTH AT BEDTIME. (Patient taking differently: Take 200 mg by mouth at bedtime. )  . amLODipine (NORVASC) 10 MG tablet Take 1 tablet (10 mg total) by mouth daily.  Marland Kitchen aspirin 81 MG tablet Take 81  mg by mouth daily.  . cholecalciferol (VITAMIN D3) 25 MCG (1000 UNIT) tablet Take 2,000 Units by mouth daily.  Marland Kitchen HYDROcodone-acetaminophen (NORCO) 5-325 MG tablet Take 1 tablet by mouth every 4 (four) hours as needed for moderate pain.  Marland Kitchen ibuprofen (ADVIL) 600 MG tablet Take 600 mg by mouth every 6 (six) hours as needed for moderate pain.   . metoprolol (LOPRESSOR) 50 MG tablet TAKE 1 TABLET BY MOUTH TWICE A DAY (Patient taking differently: Take 50 mg by mouth 2 (two) times daily. )  . Omega-3 Fatty Acids (FISH OIL) 1000 MG CAPS Take 1,000 mg by mouth daily.  Marland Kitchen omeprazole (PRILOSEC) 20 MG capsule Take 1 capsule (20 mg total) by mouth every morning. (Patient taking differently: Take 20 mg by mouth in the morning. )  . pravastatin (PRAVACHOL) 80 MG tablet Take 80 mg by mouth daily.  . Tiotropium Bromide-Olodaterol (STIOLTO RESPIMAT) 2.5-2.5 MCG/ACT AERS Inhale 2 puffs into the lungs daily.   No facility-administered encounter medications on file as of 11/23/2019.    ALLERGIES:  No Known Allergies   PHYSICAL EXAM:  ECOG Performance status: ***  There were no vitals filed for this visit. There were no vitals filed for this visit.  Physical Exam   LABORATORY DATA:  I have reviewed the labs as listed.  CBC    Component Value Date/Time   WBC 8.3 10/19/2019 1352   RBC 3.88 10/19/2019 1352   HGB 12.9 11/16/2019 0710   HCT 38.0 11/16/2019 0710   PLT 367 10/19/2019 1352   MCV 93.8 10/19/2019 1352   MCH 28.9 10/19/2019 1352   MCHC 30.8 10/19/2019 1352   RDW 16.9 (H) 10/19/2019 1352   LYMPHSABS 2.0 10/19/2019 1352   MONOABS 0.6 10/19/2019 1352   EOSABS 0.2 10/19/2019 1352   BASOSABS 0.0 10/19/2019 1352   CMP Latest Ref Rng & Units 11/16/2019 07/12/2019 10/27/2018  Glucose 70 - 99 mg/dL 115(H) 102(H) 157(H)  BUN 6 - 20 mg/dL 18 13 8   Creatinine 0.44 - 1.00 mg/dL 0.70 0.68 0.65  Sodium 135 - 145 mmol/L 140 136 136  Potassium 3.5 - 5.1 mmol/L 3.8 3.3(L) 3.8  Chloride 98 - 111 mmol/L  107 106 106  CO2 22 - 32 mmol/L - 21(L) 22  Calcium 8.9 - 10.3 mg/dL - 8.5(L) 8.2(L)  Total Protein 6.5 - 8.1 g/dL - - 7.3  Total Bilirubin 0.3 - 1.2 mg/dL - - 0.4  Alkaline Phos 38 - 126 U/L - - 70  AST 15 - 41 U/L - - 36  ALT 0 - 44 U/L - - 26       DIAGNOSTIC IMAGING:  I have independently reviewed the scans and discussed with the patient.   I  have reviewed Mathis Bud, NP's note and agree with the documentation.  I personally performed a face-to-face visit, made revisions and my assessment and plan is as follows.    ASSESSMENT & PLAN:   Mediastinal lymphadenopathy 1.  Mediastinal and right axillary adenopathy: -Patient had CT of the chest on 10/26/2018 which showed multifocal pneumonia and multifocal mediastinal adenopathy.  She was treated with antibiotics at that time. -CT chest PE protocol on 07/12/2019 showed lymphadenopathy anterior to the Carina, AP window, peritracheal, right hilar and several prominent subcarinal lymph nodes.  Largest lymph node was measuring 2.4 cm.  Lung showed mosaic attenuation at several sites. -She was evaluated by Dr. Silvestre Gunner and PET CT scan was done on 09/07/2019.  This showed 1.2 cm prevascular lymph node, right paratracheal lymph node, subcarinal lymph node, right hilar lymph node.  Within the right axillary there is prominent lymph nodes measuring 0.9 cm short axis with SUV of 5.8.  There is increased uptake in the distal esophagus, nonspecific with SUV of 5.32.  1.7 cm peripancreatic lymph node with SUV max of 2.68. -Patient denies any fevers or night sweats.  She lost 8 pounds however was trying to lose weight. -She had right axillary lymph node core biopsy on 09/21/2019 which showed limited lymphoid tissue.  Lymph node tissue primarily composed of small round to slightly irregular lymphocytes mixed with variable number of plasma cells.  No well-formed granuloma or malignancy noted.  Insufficient cells for flow cytometry. 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  She smokes 8 to 10 cigarettes a day for the last 25 years.  She does report brownish sputum in small amounts. -High-resolution CT scan is being planned for sarcoidosis work-up. -We will check LDH level and an ACE level today.  I do not feel any axillary adenopathy. -If LDH is elevated we will plan for excision biopsy of the right axillary lymph node or core biopsy of the mediastinal lymph node by bronchoscopy.  2.  Distal esophageal uptake: -PET CT scan showed increased uptake in the distal esophagus with SUV of 5.32 with no mass. -She is seeing GI next week for possible endoscopy.  3.  Anemia: -She has normocytic anemia intermittently. -We will check CBC, ferritin and iron panel B12 and folic acid.        Orders placed this encounter:  No orders of the defined types were placed in this encounter.     Doreatha Massed, MD [facility] [phone]

## 2019-11-24 ENCOUNTER — Inpatient Hospital Stay (HOSPITAL_COMMUNITY): Payer: Commercial Managed Care - PPO | Attending: Hematology | Admitting: Hematology

## 2019-11-24 DIAGNOSIS — R590 Localized enlarged lymph nodes: Secondary | ICD-10-CM

## 2019-11-24 NOTE — Progress Notes (Signed)
Virtual Visit via Telephone Note  I connected with Donna Fitzgerald on 11/24/19 at  3:40 PM EDT by telephone and verified that I am speaking with the correct person using two identifiers.   I discussed the limitations, risks, security and privacy concerns of performing an evaluation and management service by telephone and the availability of in person appointments. I also discussed with the patient that there may be a patient responsible charge related to this service. The patient expressed understanding and agreed to proceed.   History of Present Illness: She was seen in our clinic for mediastinal and right axillary adenopathy at the request of Dr. Isaiah Serge.  PET scan on 09/07/2019 showed 1.2 cm prevascular lymph node, right paratracheal lymph node, subcarinal lymph node, right hilar lymph node and prominent right axillary lymph nodes measuring 0.9 cm with SUV 5.8.  Increased uptake in the distal esophagus nonspecific SUV 5.82.  1.7 cm peripancreatic lymph node with SUV max of 2.68.  She does not have any B symptoms.  She lost some weight but was trying to lose.   Observations/Objective: Denies any fevers or night sweats.  Reports decrease in energy levels.  Appetite is 75%.  Shortness of breath on exertion is stable.  Reports some whitish area below the biopsy site.  Assessment and Plan:  1.  Mediastinal and right axillary adenopathy: -I discussed the biopsy findings on 11/16/2018 one of the right axillary lymph node.  It showed reactive lymphoid hyperplasia.  No granulomata or metastatic malignancy identified. -She has a follow-up with Dr. Concha Se for EGD and colonoscopy on 11/30/2019.  This is still located distal esophageal nonspecific uptake on recent PET scan. -I have recommended a CT of the chest with contrast and labs including LDH in 2 months.   Follow Up Instructions: RTC 2 months with labs and scan.   I discussed the assessment and treatment plan with the patient. The patient was  provided an opportunity to ask questions and all were answered. The patient agreed with the plan and demonstrated an understanding of the instructions.   The patient was advised to call back or seek an in-person evaluation if the symptoms worsen or if the condition fails to improve as anticipated.  I provided 12 minutes of non-face-to-face time during this encounter.   Doreatha Massed, MD

## 2019-11-26 NOTE — Addendum Note (Signed)
Addendum  created 11/26/19 8101 by Franco Nones, CRNA   Charge Capture section accepted

## 2019-11-28 ENCOUNTER — Ambulatory Visit (INDEPENDENT_AMBULATORY_CARE_PROVIDER_SITE_OTHER): Payer: Commercial Managed Care - PPO

## 2019-11-28 ENCOUNTER — Other Ambulatory Visit: Payer: Self-pay

## 2019-11-28 ENCOUNTER — Other Ambulatory Visit: Payer: Self-pay | Admitting: Internal Medicine

## 2019-11-28 DIAGNOSIS — Z1159 Encounter for screening for other viral diseases: Secondary | ICD-10-CM

## 2019-11-28 LAB — SARS CORONAVIRUS 2 (TAT 6-24 HRS): SARS Coronavirus 2: NEGATIVE

## 2019-11-30 ENCOUNTER — Ambulatory Visit (AMBULATORY_SURGERY_CENTER): Payer: Commercial Managed Care - PPO | Admitting: Internal Medicine

## 2019-11-30 ENCOUNTER — Encounter: Payer: Self-pay | Admitting: Internal Medicine

## 2019-11-30 ENCOUNTER — Other Ambulatory Visit: Payer: Self-pay

## 2019-11-30 VITALS — BP 114/69 | HR 72 | Temp 97.8°F | Resp 22 | Ht 61.0 in | Wt 185.0 lb

## 2019-11-30 DIAGNOSIS — Z538 Procedure and treatment not carried out for other reasons: Secondary | ICD-10-CM | POA: Diagnosis not present

## 2019-11-30 DIAGNOSIS — Z1211 Encounter for screening for malignant neoplasm of colon: Secondary | ICD-10-CM | POA: Diagnosis not present

## 2019-11-30 DIAGNOSIS — R948 Abnormal results of function studies of other organs and systems: Secondary | ICD-10-CM | POA: Diagnosis present

## 2019-11-30 MED ORDER — SODIUM CHLORIDE 0.9 % IV SOLN: 500.0000 mL | Freq: Once | INTRAVENOUS | Status: DC

## 2019-11-30 NOTE — Op Note (Signed)
[facility] Patient Name: Donna Fitzgerald Procedure Date: 11/30/2019 1:55 PM MRN: [id number] Endoscopist: Iva Boop , MD Age: 54 Referring MD:  Date of Birth: [birth year] Gender: Female Account #: [id number] Procedure:                Colonoscopy Indications:              Screening for colorectal malignant neoplasm, This                            is the patient's first colonoscopy Medicines:                Propofol per Anesthesia, Monitored Anesthesia Care Procedure:                Pre-Anesthesia Assessment:                           - Prior to the procedure, a History and Physical                            was performed, and patient medications and                            allergies were reviewed. The patient's tolerance of                            previous anesthesia was also reviewed. The risks                            and benefits of the procedure and the sedation                            options and risks were discussed with the patient.                            All questions were answered, and informed consent                            was obtained. Prior Anticoagulants: The patient has                            taken no previous anticoagulant or antiplatelet                            agents. ASA Grade Assessment: III - A patient with                            severe systemic disease. After reviewing the risks                            and benefits, the patient was deemed in                            satisfactory condition to undergo the procedure.  After obtaining informed consent, the colonoscope                            was passed under direct vision. Throughout the                            procedure, the patient's blood pressure, pulse, and                            oxygen saturations were monitored continuously. The                            Colonoscope was introduced through the anus with                             the intention of advancing to the cecum. The scope                            was advanced to the descending colon before the                            procedure was aborted. Medications were given. The                            colonoscopy was somewhat difficult due to poor                            endoscopic visualization. The patient tolerated the                            procedure well. The quality of the bowel                            preparation was inadequate. Scope In: 2:10:53 PM Scope Out: 2:13:30 PM Total Procedure Duration: 0 hours 2 minutes 37 seconds  Findings:                 The perianal and digital rectal examinations were                            normal.                           Food debris in rectum and colon - large amounts of                            vegfetable material similar to what I saw in                            stomach but this impaired visualization so that                            adequate views not possible. Procedure aborted. Complications:            No immediate  complications. Estimated Blood Loss:     Estimated blood loss: none. Impression:               - Preparation of the colon was inadequate. There                            was a large amount of food debris - vegetable fiber                            it looked like. Similar to what I saw in stomach. ?                            if dietary restrictions were observed.                           - No specimens collected. Recommendation:           - Patient has a contact number available for                            emergencies. The signs and symptoms of potential                            delayed complications were discussed with the                            patient. Return to normal activities tomorrow.                            Written discharge instructions were provided to the                            patient.                           - Resume previous diet.                            - Continue present medications.                           - Repeat colonoscopy within a few months because                            the bowel preparation was poor. Sort out prep - ?                            was this failure to follow food restrictions or a                            prep issue or both. Gatha Mayer, MD 11/30/2019 2:31:07 PM This report has been signed electronically.

## 2019-11-30 NOTE — Op Note (Signed)
[place] Patient Name: Donna Fitzgerald Procedure Date: 11/30/2019 1:56 PM MRN: [id number] Endoscopist: Gatha Mayer , MD Age: 54 Referring MD:  Date of Birth: 09-09-1965 Gender: Female Account #: [id number] Procedure:                Upper GI endoscopy Indications:              Abnormal PET scan of the GI tract Medicines:                Propofol per Anesthesia, Monitored Anesthesia Care Procedure:                Pre-Anesthesia Assessment:                           - Prior to the procedure, a History and Physical                            was performed, and patient medications and                            allergies were reviewed. The patient's tolerance of                            previous anesthesia was also reviewed. The risks                            and benefits of the procedure and the sedation                            options and risks were discussed with the patient.                            All questions were answered, and informed consent                            was obtained. Prior Anticoagulants: The patient has                            taken no previous anticoagulant or antiplatelet                            agents. ASA Grade Assessment: III - A patient with                            severe systemic disease. After reviewing the risks                            and benefits, the patient was deemed in                            satisfactory condition to undergo the procedure.                           After obtaining informed consent, the endoscope was  passed under direct vision. Throughout the                            procedure, the patient's blood pressure, pulse, and                            oxygen saturations were monitored continuously. The                            Endoscope was introduced through the mouth, and                            advanced to the second part of duodenum. The upper         GI endoscopy was accomplished without difficulty.                            The patient tolerated the procedure well. Scope In: Scope Out: Findings:                 The examined esophagus was normal.                           A small amount of food (residue) was found in the                            gastric body and in the gastric antrum.                           Food (residue) was found in the duodenal bulb.                           The exam was otherwise without abnormality.                           The cardia and gastric fundus were normal on                            retroflexion. Complications:            No immediate complications. Estimated Blood Loss:     Estimated blood loss: none. Impression:               - Normal esophagus. No abnormality to explain                            abnormal signal on PET                           - A small amount of food (residue) in the stomach.                            Vegetable material (this was also seen in her colon                            at subsequent colonoscopy)                           -  Retained food in the duodenum.                           - The examination was otherwise normal.                           - No specimens collected. Recommendation:           - Patient has a contact number available for                            emergencies. The signs and symptoms of potential                            delayed complications were discussed with the                            patient. Return to normal activities tomorrow.                            Written discharge instructions were provided to the                            patient.                           - Resume previous diet.                           - Continue present medications.                           - See the other procedure note for documentation of                            additional recommendations. Iva Boop, MD 11/30/2019 2:23:24 PM This  report has been signed electronically.

## 2019-11-30 NOTE — Progress Notes (Signed)
To PACU, VSS. Report to [email or web address] 

## 2019-11-30 NOTE — Patient Instructions (Addendum)
The esophagus, stomach and upper intestine look normal. Good news!  I could not complete the colonoscopy due to a large amount of vegetable material - paritally digested, in the colon.  You also had some of this in the stomach.  We will need to repeat the colonoscopy and change up the prep to get you clear so I can give you a good exam.  I appreciate the opportunity to care for you. Iva Boop, MD, [facility]   Please read handouts provided. Colonoscopy rescheduled to February 01, 2020 at 10;30 am, arrival 9;30 am. Continue present medications.      YOU HAD AN ENDOSCOPIC PROCEDURE TODAY AT THE [facility]:   Refer to the procedure report that was given to you for any specific questions about what was found during the examination.  If the procedure report does not answer your questions, please call your gastroenterologist to clarify.  If you requested that your care partner not be given the details of your procedure findings, then the procedure report has been included in a sealed envelope for you to review at your convenience later.  YOU SHOULD EXPECT: Some feelings of bloating in the abdomen. Passage of more gas than usual.  Walking can help get rid of the air that was put into your GI tract during the procedure and reduce the bloating. If you had a lower endoscopy (such as a colonoscopy or flexible sigmoidoscopy) you may notice spotting of blood in your stool or on the toilet paper. If you underwent a bowel prep for your procedure, you may not have a normal bowel movement for a few days.  Please Note:  You might notice some irritation and congestion in your nose or some drainage.  This is from the oxygen used during your procedure.  There is no need for concern and it should clear up in a day or so.  SYMPTOMS TO REPORT IMMEDIATELY:   Following lower endoscopy (colonoscopy or flexible sigmoidoscopy):  Excessive amounts of blood in the stool  Significant tenderness or worsening  of abdominal pains  Swelling of the abdomen that is new, acute  Fever of 100F or higher   Following upper endoscopy (EGD)  Vomiting of blood or coffee ground material  New chest pain or pain under the shoulder blades  Painful or persistently difficult swallowing  New shortness of breath  Fever of 100F or higher  Black, tarry-looking stools  For urgent or emergent issues, a gastroenterologist can be reached at any hour by calling [phone]. Do not use MyChart messaging for urgent concerns.    DIET:  We do recommend a small meal at first, but then you may proceed to your regular diet.  Drink plenty of fluids but you should avoid alcoholic beverages for 24 hours.  ACTIVITY:  You should plan to take it easy for the rest of today and you should NOT DRIVE or use heavy machinery until tomorrow (because of the sedation medicines used during the test).    FOLLOW UP: Our staff will call the number listed on your records 48-72 hours following your procedure to check on you and address any questions or concerns that you may have regarding the information given to you following your procedure. If we do not reach you, we will leave a message.  We will attempt to reach you two times.  During this call, we will ask if you have developed any symptoms of COVID 19. If you develop any symptoms (ie: fever, flu-like symptoms,  shortness of breath, cough etc.) before then, please call [phone].  If you test positive for Covid 19 in the 2 weeks post procedure, please call and report this information to [place].    If any biopsies were taken you will be contacted by phone or by letter within the next 1-3 weeks.  Please call us at [phone] if you have not heard about the biopsies in 3 weeks.    SIGNATURES/CONFIDENTIALITY: You and/or your care partner have signed paperwork which will be entered into your electronic medical record.  These signatures attest to the fact that that the information above on  your After Visit Summary has been reviewed and is understood.  Full responsibility of the confidentiality of this discharge information lies with you and/or your care-partner.

## 2019-11-30 NOTE — Progress Notes (Signed)
Temp by JB  Vitals by CW 

## 2019-12-02 ENCOUNTER — Telehealth: Payer: Self-pay | Admitting: *Deleted

## 2019-12-02 ENCOUNTER — Telehealth: Payer: Self-pay

## 2019-12-02 NOTE — Telephone Encounter (Signed)
First attempt, left VM.  

## 2019-12-02 NOTE — Telephone Encounter (Signed)
  Follow up Call-  Call back number 11/30/2019  Post procedure Call Back phone  # [phone]  Permission to leave phone message Yes  Some recent data might be hidden     Patient questions:  Do you have a fever, pain , or abdominal swelling? No. Pain Score  0 *  Have you tolerated food without any problems? Yes.    Have you been able to return to your normal activities? Yes.    Do you have any questions about your discharge instructions: Diet   No. Medications  No. Follow up visit  No.  Do you have questions or concerns about your Care? No.  Actions: * If pain score is 4 or above: No action needed, pain <4. 1. \Have you developed a fever since your procedure? no  2.   Have you had an respiratory symptoms (SOB or cough) since your procedure? no  3.   Have you tested positive for COVID 19 since your procedure no  4.   Have you had any family members/close contacts diagnosed with the COVID 19 since your procedure?  no   If yes to any of these questions please route to Laverna Peace, RN and Charlett Lango, RN

## 2020-01-09 ENCOUNTER — Telehealth: Payer: Self-pay | Admitting: *Deleted

## 2020-01-09 NOTE — Telephone Encounter (Signed)
thx- will proceed as scheduled

## 2020-01-09 NOTE — Telephone Encounter (Signed)
John,  I need your help with this pt.   She saw Dr Leone Payor in the office 10-26-2019-  She had an endo/colon 11-30-2019 in the [facility] - no issues- states in her reports ASA III- she had a poor prep for her colon and he is bringing her back for a repeat- she did not have a PV prior to her 11-30-2019 procedures- just OV in March-  In her record it states this-   Difficult Intubation  Difficult direct laryngoscopy due to anterior glottic opening, easy glidescope, easy mask ventilation.  Please advise since she already had 2 procedures here 11-30-2019  ThanksHilda Lias PV

## 2020-01-09 NOTE — Telephone Encounter (Signed)
Donna Fitzgerald,  I have reviewed this pt's chart.  During her most recent operative procedure on 11/16/19 she was intubated using laryngoscope with a MAC 3 blade.  They did not need to use any advanced modalities.  Consequently, she  is cleared for anesthetic care at [facility].   Thanks much,  Osvaldo Angst

## 2020-01-23 ENCOUNTER — Other Ambulatory Visit: Payer: Self-pay

## 2020-01-23 ENCOUNTER — Other Ambulatory Visit: Payer: Self-pay | Admitting: Nurse Practitioner

## 2020-01-23 ENCOUNTER — Ambulatory Visit (AMBULATORY_SURGERY_CENTER): Payer: Self-pay | Admitting: *Deleted

## 2020-01-23 VITALS — Ht 61.5 in | Wt 192.0 lb

## 2020-01-23 DIAGNOSIS — Z1211 Encounter for screening for malignant neoplasm of colon: Secondary | ICD-10-CM

## 2020-01-23 MED ORDER — SUTAB 1479-225-188 MG PO TABS: 1.0000 | ORAL_TABLET | ORAL | 0 refills | Status: DC

## 2020-01-23 NOTE — Progress Notes (Signed)
Patient is here in-person for PV. Patient denies any allergies to eggs or soy. Patient denies any problems with anesthesia/sedation. Patient denies any oxygen use at home. Patient denies taking any diet/weight loss medications or blood thinners. Patient is not being treated for MRSA or C-diff. Patient is aware of our care-partner policy and Covid-19 safety protocol. Pt completed both covid vaccines in Feb. 2021 per pt. Patient denies any changes in medical hx since last GI visit.  Prep Prescription coupon given to the patient.

## 2020-01-25 ENCOUNTER — Ambulatory Visit (HOSPITAL_COMMUNITY)
Admission: RE | Admit: 2020-01-25 | Discharge: 2020-01-25 | Disposition: A | Discharge status: Home / Self Care | Payer: Commercial Managed Care - PPO | Source: Ambulatory Visit | Attending: Hematology | Admitting: Hematology

## 2020-01-25 ENCOUNTER — Inpatient Hospital Stay (HOSPITAL_COMMUNITY): Payer: Commercial Managed Care - PPO | Attending: Hematology

## 2020-01-25 ENCOUNTER — Other Ambulatory Visit: Payer: Self-pay

## 2020-01-25 DIAGNOSIS — R590 Localized enlarged lymph nodes: Secondary | ICD-10-CM | POA: Insufficient documentation

## 2020-01-25 DIAGNOSIS — Z79899 Other long term (current) drug therapy: Secondary | ICD-10-CM | POA: Insufficient documentation

## 2020-01-25 DIAGNOSIS — Z833 Family history of diabetes mellitus: Secondary | ICD-10-CM | POA: Diagnosis not present

## 2020-01-25 DIAGNOSIS — Z7982 Long term (current) use of aspirin: Secondary | ICD-10-CM | POA: Diagnosis not present

## 2020-01-25 DIAGNOSIS — E785 Hyperlipidemia, unspecified: Secondary | ICD-10-CM | POA: Diagnosis not present

## 2020-01-25 DIAGNOSIS — Z8249 Family history of ischemic heart disease and other diseases of the circulatory system: Secondary | ICD-10-CM | POA: Diagnosis not present

## 2020-01-25 DIAGNOSIS — Z791 Long term (current) use of non-steroidal anti-inflammatories (NSAID): Secondary | ICD-10-CM | POA: Diagnosis not present

## 2020-01-25 DIAGNOSIS — F17210 Nicotine dependence, cigarettes, uncomplicated: Secondary | ICD-10-CM | POA: Insufficient documentation

## 2020-01-25 LAB — CBC WITH DIFFERENTIAL/PLATELET
Abs Immature Granulocytes: 0.02 10*3/uL (ref 0.00–0.07)
Basophils Absolute: 0 10*3/uL (ref 0.0–0.1)
Basophils Relative: 1 %
Eosinophils Absolute: 0.1 10*3/uL (ref 0.0–0.5)
Eosinophils Relative: 1 %
HCT: 38.3 % (ref 36.0–46.0)
Hemoglobin: 12.4 g/dL (ref 12.0–15.0)
Immature Granulocytes: 0 %
Lymphocytes Relative: 22 %
Lymphs Abs: 1.8 10*3/uL (ref 0.7–4.0)
MCH: 28.8 pg (ref 26.0–34.0)
MCHC: 32.4 g/dL (ref 30.0–36.0)
MCV: 89.1 fL (ref 80.0–100.0)
Monocytes Absolute: 0.6 10*3/uL (ref 0.1–1.0)
Monocytes Relative: 7 %
Neutro Abs: 5.9 10*3/uL (ref 1.7–7.7)
Neutrophils Relative %: 69 %
Platelets: 285 10*3/uL (ref 150–400)
RBC: 4.3 MIL/uL (ref 3.87–5.11)
RDW: 16.4 % — ABNORMAL HIGH (ref 11.5–15.5)
WBC: 8.4 10*3/uL (ref 4.0–10.5)
nRBC: 0 % (ref 0.0–0.2)

## 2020-01-25 LAB — POCT I-STAT CREATININE: Creatinine, Ser: 0.7 mg/dL (ref 0.44–1.00)

## 2020-01-25 LAB — COMPREHENSIVE METABOLIC PANEL
ALT: 31 U/L (ref 0–44)
AST: 23 U/L (ref 15–41)
Albumin: 4.1 g/dL (ref 3.5–5.0)
Alkaline Phosphatase: 85 U/L (ref 38–126)
Anion gap: 11 (ref 5–15)
BUN: 16 mg/dL (ref 6–20)
CO2: 23 mmol/L (ref 22–32)
Calcium: 9.2 mg/dL (ref 8.9–10.3)
Chloride: 103 mmol/L (ref 98–111)
Creatinine, Ser: 0.77 mg/dL (ref 0.44–1.00)
GFR calc Af Amer: >60 mL/min (ref >60)
GFR calc non Af Amer: >60 mL/min (ref >60)
Glucose, Bld: 114 mg/dL — ABNORMAL HIGH (ref 70–99)
Potassium: 3.6 mmol/L (ref 3.5–5.1)
Sodium: 137 mmol/L (ref 135–145)
Total Bilirubin: 0.8 mg/dL (ref 0.3–1.2)
Total Protein: 8.3 g/dL — ABNORMAL HIGH (ref 6.5–8.1)

## 2020-01-25 LAB — LACTATE DEHYDROGENASE: LDH: 230 U/L — ABNORMAL HIGH (ref 98–192)

## 2020-01-25 MED ORDER — IOHEXOL 300 MG/ML  SOLN
75.0000 mL | Freq: Once | INTRAMUSCULAR | Status: AC | PRN
  Administered 2020-01-25: 75 mL via INTRAVENOUS

## 2020-01-30 ENCOUNTER — Other Ambulatory Visit: Payer: Self-pay

## 2020-01-30 ENCOUNTER — Inpatient Hospital Stay (HOSPITAL_BASED_OUTPATIENT_CLINIC_OR_DEPARTMENT_OTHER): Payer: Commercial Managed Care - PPO | Admitting: Hematology

## 2020-01-30 VITALS — BP 106/61 | HR 77 | Temp 98.8°F | Resp 18 | Wt 187.5 lb

## 2020-01-30 DIAGNOSIS — R591 Generalized enlarged lymph nodes: Secondary | ICD-10-CM

## 2020-01-30 DIAGNOSIS — R590 Localized enlarged lymph nodes: Secondary | ICD-10-CM | POA: Diagnosis not present

## 2020-01-30 DIAGNOSIS — R599 Enlarged lymph nodes, unspecified: Secondary | ICD-10-CM

## 2020-01-30 NOTE — Patient Instructions (Signed)
[facility] at [facility] Discharge Instructions  You were seen today by Dr. Ellin Saba. He went over your recent results. Please continue your routine care with your primary care provider. You will be scheduled for a CT scan of your chest in 6 months. Dr. Ellin Saba will see you back after the scan for labs and follow up.   Thank you for choosing [facility] at [facility] to provide your oncology and hematology care.  To afford each patient quality time with our provider, please arrive at least 15 minutes before your scheduled appointment time.   If you have a lab appointment with the Cancer Center please come in thru the Main Entrance and check in at the main information desk  You need to re-schedule your appointment should you arrive 10 or more minutes late.  We strive to give you quality time with our providers, and arriving late affects you and other patients whose appointments are after yours.  Also, if you no show three or more times for appointments you may be dismissed from the clinic at the providers discretion.     Again, thank you for choosing [facility].  Our hope is that these requests will decrease the amount of time that you wait before being seen by our physicians.       _____________________________________________________________  Should you have questions after your visit to [facility], please contact our office at [phone] between the hours of 8:00 a.m. and 4:30 p.m.  Voicemails left after 4:00 p.m. will not be returned until the following business day.  For prescription refill requests, have your pharmacy contact our office and allow 72 hours.    Cancer Center Support Programs:   > Cancer Support Group  2nd Tuesday of the month 1pm-2pm, Journey Room

## 2020-01-30 NOTE — Progress Notes (Signed)
[address] [place], [place] [address]   CLINIC:  Medical Oncology/Hematology  PCP:  [facility] [address] [place] [address] [phone]   REASON FOR VISIT:  Follow-up for mediastinal and right axillary adenopathy  PRIOR THERAPY: None  CURRENT THERAPY: Observation  BRIEF ONCOLOGIC HISTORY:  Oncology History   No history exists.    CANCER STAGING: Cancer Staging No matching staging information was found for the patient.  INTERVAL HISTORY:  [name], a 54 y.o. female, returns for routine follow-up of her mediastinal and right axillary adenopathy. Donna Fitzgerald was last contacted via phone on 11/24/2019.  Today she reports feeling well. She denies having any F/C/night sweats or weight loss. She has a productive cough with brown sputum.  She still smokes 1/2 PPD. She denies knowledge of sarcoidosis in her family.   REVIEW OF SYSTEMS:  Review of Systems  Constitutional: Positive for fatigue (severe). Negative for appetite change, chills, diaphoresis, fever and unexpected weight change.  Respiratory: Positive for cough (productive w/ brown sputum).   All other systems reviewed and are negative.   PAST MEDICAL/SURGICAL HISTORY:  Past Medical History:  Diagnosis Date  . Acute respiratory failure with hypoxia ([place]) 10/26/2018  . Arthritis   . Difficult intubation   . GERD (gastroesophageal reflux disease)   . HLD (hyperlipidemia)   . Hypertension   . Influenza A 10/26/2018  . Pneumonia 10/26/2018   Past Surgical History:  Procedure Laterality Date  . AXILLARY LYMPH NODE BIOPSY Right 11/16/2019   Procedure: AXILLARY LYMPH NODE BIOPSY;  Surgeon: Aviva Signs, MD;  Location: [facility];  Service: General;  Laterality: Right;  . FINGER SURGERY Right 1995   [place]  . LUMBAR SPINE SURGERY      SOCIAL HISTORY:  Social History   Socioeconomic History  . Marital status: Married    Spouse name: Berneta Sages  .  Number of children: 1  . Years of education: Not on file  . Highest education level: Not on file  Occupational History  . Occupation: CNA  Tobacco Use  . Smoking status: Current Every Day Smoker    Packs/day: 0.50    Years: 30.00    Pack years: 15.00    Types: Cigarettes  . Smokeless tobacco: Never Used  Vaping Use  . Vaping Use: Some days  Substance and Sexual Activity  . Alcohol use: Yes    Comment: ocassional on the weekends  . Drug use: No  . Sexual activity: Not Currently  Other Topics Concern  . Not on file  Social History Narrative  . Not on file   Social Determinants of Health   Financial Resource Strain: Low Risk   . Difficulty of Paying Living Expenses: Not hard at all  Food Insecurity: No Food Insecurity  . Worried About Charity fundraiser in the Last Year: Never true  . Ran Out of Food in the Last Year: Never true  Transportation Needs: No Transportation Needs  . Lack of Transportation (Medical): No  . Lack of Transportation (Non-Medical): No  Physical Activity: Inactive  . Days of Exercise per Week: 0 days  . Minutes of Exercise per Session: 0 min  Stress: No Stress Concern Present  . Feeling of Stress : Not at all  Social Connections: Moderately Integrated  . Frequency of Communication with Friends and Family: Three times a week  . Frequency of Social Gatherings with Friends and Family: Three times a week  .  Attends Religious Services: More than 4 times per year  . Active Member of Clubs or Organizations: No  . Attends Archivist Meetings: Never  . Marital Status: Married  Human resources officer Violence: Not At Risk  . Fear of Current or Ex-Partner: No  . Emotionally Abused: No  . Physically Abused: No  . Sexually Abused: No    FAMILY HISTORY:  Family History  Problem Relation Age of Onset  . Hypertension Mother   . Alzheimer's disease Mother   . Diabetes Mother   . Heart disease Father   . Hypertension Father   . Hypertension Sister    . Heart disease Sister   . Heart disease Sister   . Hypertension Sister   . Colon cancer Neg Hx   . Esophageal cancer Neg Hx   . Stomach cancer Neg Hx   . Colon polyps Neg Hx     CURRENT MEDICATIONS:  Current Outpatient Medications  Medication Sig Dispense Refill  . albuterol (PROVENTIL HFA;VENTOLIN HFA) 108 (90 Base) MCG/ACT inhaler Inhale 2 puffs into the lungs every 4 (four) hours as needed for wheezing or shortness of breath. 1 Inhaler 1  . amitriptyline (ELAVIL) 100 MG tablet TAKE 1 TABLET (100 MG TOTAL) BY MOUTH AT BEDTIME. (Patient taking differently: Take 200 mg by mouth at bedtime. ) 30 tablet 0  . amLODipine (NORVASC) 10 MG tablet Take 1 tablet (10 mg total) by mouth daily. 90 tablet 3  . aspirin 81 MG tablet Take 81 mg by mouth daily.    . cholecalciferol (VITAMIN D3) 25 MCG (1000 UNIT) tablet Take 2,000 Units by mouth daily.    Marland Kitchen gabapentin (NEURONTIN) 300 MG capsule Take 300 mg by mouth 3 (three) times daily.    Marland Kitchen HYDROcodone-acetaminophen (NORCO) 5-325 MG tablet Take 1 tablet by mouth every 4 (four) hours as needed for moderate pain. 30 tablet 0  . ibuprofen (ADVIL) 600 MG tablet Take 600 mg by mouth every 6 (six) hours as needed for moderate pain.     . metoprolol (LOPRESSOR) 50 MG tablet TAKE 1 TABLET BY MOUTH TWICE A DAY (Patient taking differently: Take 50 mg by mouth 2 (two) times daily. ) 60 tablet 0  . Omega-3 Fatty Acids (FISH OIL) 1000 MG CAPS Take 1,000 mg by mouth daily.    Marland Kitchen omeprazole (PRILOSEC) 20 MG capsule Take 1 capsule (20 mg total) by mouth daily. 90 capsule 1  . pravastatin (PRAVACHOL) 80 MG tablet Take 80 mg by mouth daily.    . Sodium Sulfate-Mag Sulfate-KCl (SUTAB) [id number] MG TABS Take 1 kit by mouth as directed. 24 tablet 0  . Tiotropium Bromide-Olodaterol (STIOLTO RESPIMAT) 2.5-2.5 MCG/ACT AERS Inhale 2 puffs into the lungs daily. 4 g 5   No current facility-administered medications for this visit.    ALLERGIES:  No Known  Allergies  PHYSICAL EXAM:  Performance status (ECOG): 0 - Asymptomatic  There were no vitals filed for this visit. Wt Readings from Last 3 Encounters:  01/23/20 192 lb (87.1 kg)  11/30/19 185 lb (83.9 kg)  11/14/19 185 lb (83.9 kg)   Physical Exam Vitals reviewed.  Constitutional:      Appearance: Normal appearance.  Cardiovascular:     Rate and Rhythm: Normal rate and regular rhythm.     Heart sounds: Normal heart sounds.  Pulmonary:     Effort: Pulmonary effort is normal.     Breath sounds: Normal breath sounds.  Abdominal:     General: There is no distension.  Palpations: Abdomen is soft. There is no mass.  Neurological:     General: No focal deficit present.     Mental Status: She is alert and oriented to person, place, and time.  Psychiatric:        Mood and Affect: Mood normal.        Behavior: Behavior normal.      LABORATORY DATA:  I have reviewed the labs as listed.  CBC Latest Ref Rng & Units 01/25/2020 11/16/2019 10/19/2019  WBC 4.0 - 10.5 K/uL 8.4 - 8.3  Hemoglobin 12.0 - 15.0 g/dL 12.4 12.9 11.2(L)  Hematocrit 36 - 46 % 38.3 38.0 36.4  Platelets 150 - 400 K/uL 285 - 367   CMP Latest Ref Rng & Units 01/25/2020 01/25/2020 11/16/2019  Glucose 70 - 99 mg/dL - 114(H) 115(H)  BUN 6 - 20 mg/dL - 16 18  Creatinine 0.44 - 1.00 mg/dL 0.70 0.77 0.70  Sodium 135 - 145 mmol/L - 137 140  Potassium 3.5 - 5.1 mmol/L - 3.6 3.8  Chloride 98 - 111 mmol/L - 103 107  CO2 22 - 32 mmol/L - 23 -  Calcium 8.9 - 10.3 mg/dL - 9.2 -  Total Protein 6.5 - 8.1 g/dL - 8.3(H) -  Total Bilirubin 0.3 - 1.2 mg/dL - 0.8 -  Alkaline Phos 38 - 126 U/L - 85 -  AST 15 - 41 U/L - 23 -  ALT 0 - 44 U/L - 31 -    DIAGNOSTIC IMAGING:  I have independently reviewed the scans and discussed with the patient. CT Chest W Contrast  Result Date: 01/25/2020 CLINICAL DATA:  Follow-up mediastinal and axillary adenopathy. EXAM: CT CHEST WITH CONTRAST TECHNIQUE: Multidetector CT imaging of the chest was  performed during intravenous contrast administration. CONTRAST:  68m OMNIPAQUE IOHEXOL 300 MG/ML  SOLN COMPARISON:  CT chest 07/12/2019. FINDINGS: Cardiovascular: Mild cardiac enlargement. No pericardial effusion. Calcifications in the LAD and left circumflex coronary arteries. Mediastinum/Nodes: Normal appearance of the thyroid gland. The trachea appears patent and is midline. Normal appearance of the esophagus. Small bilateral supraclavicular and axillary lymph nodes are again noted, similar to previous exam. Mediastinal adenopathy is again noted. High right paratracheal lymph node measures 1.4 cm, image 30/3. Stable. Low right paratracheal lymph node measures 2.3 cm, image 49/3. Previously this measured the same. As ago esophageal lymph node measures 1.9 cm, image 59/3. Stable. 1.9 cm right hilar lymph node is unchanged, image 56/3. AP window lymph node measures 1.6 cm, image 54/3.  Also stable. Lungs/Pleura: No pleural effusion identified. No airspace consolidation, atelectasis or pneumothorax. Bilateral perifissural micro nodularity noted. Diffuse interstitial prominence is identified with a lower lung zone gradient no suspicious pulmonary nodules identified. A mild mosaic attenuation pattern is also noted. Upper Abdomen: No acute abnormality. Musculoskeletal: No chest wall abnormality. No acute or significant osseous findings. IMPRESSION: 1. Persistent but stable appearance of mediastinal and right hilar adenopathy. 2. Subtle, perifissural micronodularity is suspected. Perilymphatic nodularity may be seen with pulmonary sarcoid. 3. Mild mosaic attenuation pattern which may be seen with small airways disease. 4. Aortic atherosclerosis, in addition to lad and left circumflex coronary artery disease. Please note that although the presence of coronary artery calcium documents the presence of coronary artery disease, the severity of this disease and any potential stenosis cannot be assessed on this non-gated CT  examination. Assessment for potential risk factor modification, dietary therapy or pharmacologic therapy may be warranted, if clinically indicated. Aortic Atherosclerosis (ICD10-I70.0). Electronically Signed   By: TLovena Le  Clovis Riley M.D.   On: 01/25/2020 15:21     ASSESSMENT:  1.  Mediastinal and right axillary adenopathy: -CT chest PE protocol on 07/12/2019 showed lymphadenopathy anterior to the carina, AP window, pretracheal, right hilar and several prominent subcarinal lymph nodes, largest measuring 2.4 cm.  Lung showed mosaic attenuation at several sites. -Evaluated by Dr. Vaughan Browner on the PET scan was done on 09/07/2019.  This showed 1.2 cm prevascular lymph node, right paratracheal lymph node, subcarinal lymph node, right hilar lymph node.  Right axilla lymph node measuring 0.9 cm short axis with SUV 5.8.  There is increased uptake in the distal esophagus, nonspecific.  1.7 cm peripancreatic lymph node with SUV max of 2.68. -Right axillary lymph node biopsy on 09/21/2019 showed limited lymphoid tissue.  Lymphoid tissue primarily composed of small round to slightly irregular lymphocytes mixed with variable number of plasma cells.  No well-formed granuloma or malignancy noted. -She smokes about 8 cigarettes/day for 25 years.  She reports brownish expectoration and small amounts for a long time. -Right axillary lymph node biopsy on 11/16/2019 shows reactive lymphoid hyperplasia. -CT chest on 01/25/2020 showed persistent but stable appearance of mediastinal and right hilar adenopathy.  Subtle perifissural micronodularity is suspected.  Mild mosaic attenuation pattern which may be seen with small airway disease.   PLAN:  1.  Mediastinal and right axillary adenopathy: -She does not report any B symptoms. -I reviewed CT of the chest with contrast dated 01/25/2020 results which is stable. -I have reviewed her labs which are grossly within normal limits.  LDH is minimally elevated at 230 and stable. -I have  recommended a repeat CT of the chest in 6 months.  We will also repeat LDH.  If there is any significant changes, will consider PET scan/biopsy of the mediastinal lymph nodes.    Orders placed this encounter:  No orders of the defined types were placed in this encounter.    Donna Jack, MD [place] [phone]   I, Milinda Antis, am acting as a scribe for Dr. Sanda Linger.  I, Donna Jack MD, have reviewed the above documentation for accuracy and completeness, and I agree with the above.

## 2020-01-31 ENCOUNTER — Telehealth: Payer: Self-pay | Admitting: Internal Medicine

## 2020-01-31 NOTE — Telephone Encounter (Signed)
Hey Dr. Leone Payor, this patient was scheduled for a colonoscopy tomorrow 6/16 and called to cancel. State that she has had a family emergency and will not be able to make it. She wants to call back to reschedule. Would you like to charge the patient?

## 2020-01-31 NOTE — Telephone Encounter (Signed)
No charge. 

## 2020-02-01 ENCOUNTER — Encounter: Payer: Commercial Managed Care - PPO | Admitting: Internal Medicine

## 2020-02-10 ENCOUNTER — Telehealth: Payer: Self-pay | Admitting: Pulmonary Disease

## 2020-02-10 NOTE — Telephone Encounter (Signed)
Dr Isaiah Serge- Patient is ordered HRCT for 02/2020, but she had a chest CT w/ contrast 01/25/20 by Dr Ellin Saba.    Message routed to Dr Isaiah Serge to advise

## 2020-02-10 NOTE — Telephone Encounter (Signed)
Dr Isaiah Serge ordered a CT Chest High Resolution for this patient on 10/12/19 to ber scheduled in July.  PL had a CT w/ contrast on 01/25/20.  Does Dr. Isaiah Serge still want me to schedule his ordered CT?   Thank you,  [place]

## 2020-02-14 NOTE — Telephone Encounter (Signed)
CT order has been canceled.

## 2020-02-14 NOTE — Telephone Encounter (Signed)
We can cancel the CT for 02/2020

## 2020-08-01 ENCOUNTER — Ambulatory Visit (HOSPITAL_COMMUNITY)
Admission: RE | Admit: 2020-08-01 | Discharge: 2020-08-01 | Disposition: A | Discharge status: Home / Self Care | Payer: Commercial Managed Care - PPO | Source: Ambulatory Visit | Attending: Hematology | Admitting: Hematology

## 2020-08-01 ENCOUNTER — Other Ambulatory Visit: Payer: Self-pay

## 2020-08-01 ENCOUNTER — Inpatient Hospital Stay (HOSPITAL_COMMUNITY): Payer: Commercial Managed Care - PPO | Attending: Oncology

## 2020-08-01 DIAGNOSIS — Z79899 Other long term (current) drug therapy: Secondary | ICD-10-CM | POA: Insufficient documentation

## 2020-08-01 DIAGNOSIS — I10 Essential (primary) hypertension: Secondary | ICD-10-CM | POA: Insufficient documentation

## 2020-08-01 DIAGNOSIS — F17210 Nicotine dependence, cigarettes, uncomplicated: Secondary | ICD-10-CM | POA: Insufficient documentation

## 2020-08-01 DIAGNOSIS — R590 Localized enlarged lymph nodes: Secondary | ICD-10-CM | POA: Diagnosis not present

## 2020-08-01 DIAGNOSIS — R599 Enlarged lymph nodes, unspecified: Secondary | ICD-10-CM

## 2020-08-01 LAB — CBC WITH DIFFERENTIAL/PLATELET
Abs Immature Granulocytes: 0.03 10*3/uL (ref 0.00–0.07)
Basophils Absolute: 0.1 10*3/uL (ref 0.0–0.1)
Basophils Relative: 1 %
Eosinophils Absolute: 0.1 10*3/uL (ref 0.0–0.5)
Eosinophils Relative: 2 %
HCT: 39.8 % (ref 36.0–46.0)
Hemoglobin: 13 g/dL (ref 12.0–15.0)
Immature Granulocytes: 0 %
Lymphocytes Relative: 25 %
Lymphs Abs: 2.2 10*3/uL (ref 0.7–4.0)
MCH: 30.4 pg (ref 26.0–34.0)
MCHC: 32.7 g/dL (ref 30.0–36.0)
MCV: 93.2 fL (ref 80.0–100.0)
Monocytes Absolute: 0.7 10*3/uL (ref 0.1–1.0)
Monocytes Relative: 8 %
Neutro Abs: 5.6 10*3/uL (ref 1.7–7.7)
Neutrophils Relative %: 64 %
Platelets: 318 10*3/uL (ref 150–400)
RBC: 4.27 MIL/uL (ref 3.87–5.11)
RDW: 15.8 % — ABNORMAL HIGH (ref 11.5–15.5)
WBC: 8.8 10*3/uL (ref 4.0–10.5)
nRBC: 0 % (ref 0.0–0.2)

## 2020-08-01 LAB — COMPREHENSIVE METABOLIC PANEL
ALT: 22 U/L (ref 0–44)
AST: 20 U/L (ref 15–41)
Albumin: 4 g/dL (ref 3.5–5.0)
Alkaline Phosphatase: 79 U/L (ref 38–126)
Anion gap: 9 (ref 5–15)
BUN: 14 mg/dL (ref 6–20)
CO2: 23 mmol/L (ref 22–32)
Calcium: 9.1 mg/dL (ref 8.9–10.3)
Chloride: 106 mmol/L (ref 98–111)
Creatinine, Ser: 0.8 mg/dL (ref 0.44–1.00)
GFR, Estimated: >60 mL/min (ref >60)
Glucose, Bld: 95 mg/dL (ref 70–99)
Potassium: 3.9 mmol/L (ref 3.5–5.1)
Sodium: 138 mmol/L (ref 135–145)
Total Bilirubin: 0.5 mg/dL (ref 0.3–1.2)
Total Protein: 7.9 g/dL (ref 6.5–8.1)

## 2020-08-01 LAB — LACTATE DEHYDROGENASE: LDH: 203 U/L — ABNORMAL HIGH (ref 98–192)

## 2020-08-01 MED ORDER — IOHEXOL 300 MG/ML  SOLN
75.0000 mL | Freq: Once | INTRAMUSCULAR | Status: AC | PRN
  Administered 2020-08-01: 75 mL via INTRAVENOUS

## 2020-08-08 ENCOUNTER — Ambulatory Visit (HOSPITAL_COMMUNITY): Payer: Commercial Managed Care - PPO | Admitting: Hematology

## 2020-08-09 ENCOUNTER — Encounter (HOSPITAL_COMMUNITY): Payer: Self-pay | Admitting: Hematology and Oncology

## 2020-08-09 ENCOUNTER — Other Ambulatory Visit: Payer: Self-pay

## 2020-08-09 ENCOUNTER — Inpatient Hospital Stay (HOSPITAL_COMMUNITY): Payer: Commercial Managed Care - PPO | Admitting: Hematology and Oncology

## 2020-08-09 ENCOUNTER — Ambulatory Visit (HOSPITAL_COMMUNITY): Payer: Commercial Managed Care - PPO | Admitting: Hematology and Oncology

## 2020-08-09 VITALS — BP 145/87 | HR 97 | Temp 98.5°F | Resp 18 | Wt 193.8 lb

## 2020-08-09 DIAGNOSIS — R590 Localized enlarged lymph nodes: Secondary | ICD-10-CM | POA: Diagnosis not present

## 2020-08-09 NOTE — Patient Instructions (Signed)
[facility] at [facility] Discharge Instructions  You were seen today by Dr. Iruku. Follow up as scheduled.   Thank you for choosing [facility] at [facility] to provide your oncology and hematology care.  To afford each patient quality time with our provider, please arrive at least 15 minutes before your scheduled appointment time.   If you have a lab appointment with the Cancer Center please come in thru the Main Entrance and check in at the main information desk.  You need to re-schedule your appointment should you arrive 10 or more minutes late.  We strive to give you quality time with our providers, and arriving late affects you and other patients whose appointments are after yours.  Also, if you no show three or more times for appointments you may be dismissed from the clinic at the providers discretion.     Again, thank you for choosing [facility].  Our hope is that these requests will decrease the amount of time that you wait before being seen by our physicians.       _____________________________________________________________  Should you have questions after your visit to [facility], please contact our office at [phone] and follow the prompts.  Our office hours are 8:00 a.m. and 4:30 p.m. Monday - Friday.  Please note that voicemails left after 4:00 p.m. may not be returned until the following business day.  We are closed weekends and major holidays.  You do have access to a nurse 24-7, just call the main number to the clinic [phone] and do not press any options, hold on the line and a nurse will answer the phone.    For prescription refill requests, have your pharmacy contact our office and allow 72 hours.    Due to Covid, you will need to wear a mask upon entering the hospital. If you do not have a mask, a mask will be given to you at the Main Entrance upon arrival. For doctor visits, patients may  have 1 support person age 18 or older with them. For treatment visits, patients can not have anyone with them due to social distancing guidelines and our immunocompromised population.     

## 2020-08-09 NOTE — Progress Notes (Signed)
[facility] [address] [place], [place] [address]   CLINIC:  Medical Oncology/Hematology  PCP:  Center, [facility] [address] / [place] [id number] [phone]   REASON FOR VISIT:  Follow-up for mediastinal and right axillary adenopathy  PRIOR THERAPY: None  CURRENT THERAPY: Observation  BRIEF ONCOLOGIC HISTORY:  Oncology History   No history exists.    CANCER STAGING: Cancer Staging No matching staging information was found for the patient.  INTERVAL HISTORY:  [name], a 54 y.o. female, returns for routine follow-up of her mediastinal and right axillary adenopathy.   Today she reports feeling well. She denies having any F/C/night sweats or weight loss No change in breathing, bowel habits or urinary habits,. No other health issues.  REVIEW OF SYSTEMS:  Review of Systems  Constitutional: Positive for fatigue. Negative for appetite change, chills, diaphoresis, fever and unexpected weight change.  Respiratory: Negative for cough.   All other systems reviewed and are negative.   PAST MEDICAL/SURGICAL HISTORY:  Past Medical History:  Diagnosis Date  . Acute respiratory failure with hypoxia (HCC) 10/26/2018  . Arthritis   . Difficult intubation   . GERD (gastroesophageal reflux disease)   . HLD (hyperlipidemia)   . Hypertension   . Influenza A 10/26/2018  . Pneumonia 10/26/2018   Past Surgical History:  Procedure Laterality Date  . AXILLARY LYMPH NODE BIOPSY Right 11/16/2019   Procedure: AXILLARY LYMPH NODE BIOPSY;  Surgeon: Franky MachoJenkins, Mark, MD;  Location: [facility];  Service: General;  Laterality: Right;  . FINGER SURGERY Right 1995   [place]  . LUMBAR SPINE SURGERY      SOCIAL HISTORY:  Social History   Socioeconomic History  . Marital status: Married    Spouse name: Donna HansenGerald  . Number of children: 1  . Years of education: Not on file  . Highest education level: Not on file  Occupational History  .  Occupation: CNA  Tobacco Use  . Smoking status: Current Every Day Smoker    Packs/day: 0.50    Years: 30.00    Pack years: 15.00    Types: Cigarettes  . Smokeless tobacco: Never Used  Vaping Use  . Vaping Use: Some days  Substance and Sexual Activity  . Alcohol use: Yes    Comment: ocassional on the weekends  . Drug use: No  . Sexual activity: Not Currently  Other Topics Concern  . Not on file  Social History Narrative  . Not on file   Social Determinants of Health   Financial Resource Strain: Low Risk   . Difficulty of Paying Living Expenses: Not hard at all  Food Insecurity: No Food Insecurity  . Worried About Programme researcher, broadcasting/film/videounning Out of Food in the Last Year: Never true  . Ran Out of Food in the Last Year: Never true  Transportation Needs: No Transportation Needs  . Lack of Transportation (Medical): No  . Lack of Transportation (Non-Medical): No  Physical Activity: Inactive  . Days of Exercise per Week: 0 days  . Minutes of Exercise per Session: 0 min  Stress: No Stress Concern Present  . Feeling of Stress : Not at all  Social Connections: Moderately Integrated  . Frequency of Communication with Friends and Family: Three times a week  . Frequency of Social Gatherings with Friends and Family: Three times a week  . Attends Religious Services: More than 4 times per year  . Active Member of Clubs or Organizations: No  . Attends Club  or Organization Meetings: Never  . Marital Status: Married  Catering manager Violence: Not At Risk  . Fear of Current or Ex-Partner: No  . Emotionally Abused: No  . Physically Abused: No  . Sexually Abused: No    FAMILY HISTORY:  Family History  Problem Relation Age of Onset  . Hypertension Mother   . Alzheimer's disease Mother   . Diabetes Mother   . Heart disease Father   . Hypertension Father   . Hypertension Sister   . Heart disease Sister   . Heart disease Sister   . Hypertension Sister   . Colon cancer Neg Hx   . Esophageal cancer Neg  Hx   . Stomach cancer Neg Hx   . Colon polyps Neg Hx     CURRENT MEDICATIONS:  Current Outpatient Medications  Medication Sig Dispense Refill  . albuterol (PROVENTIL HFA;VENTOLIN HFA) 108 (90 Base) MCG/ACT inhaler Inhale 2 puffs into the lungs every 4 (four) hours as needed for wheezing or shortness of breath. 1 Inhaler 1  . amitriptyline (ELAVIL) 100 MG tablet TAKE 1 TABLET (100 MG TOTAL) BY MOUTH AT BEDTIME. (Patient taking differently: Take 200 mg by mouth at bedtime.) 30 tablet 0  . amLODipine (NORVASC) 10 MG tablet Take 1 tablet (10 mg total) by mouth daily. 90 tablet 3  . aspirin 81 MG tablet Take 81 mg by mouth daily.    . cholecalciferol (VITAMIN D3) 25 MCG (1000 UNIT) tablet Take 2,000 Units by mouth daily.    Marland Kitchen gabapentin (NEURONTIN) 300 MG capsule Take 300 mg by mouth 3 (three) times daily.    Marland Kitchen HYDROcodone-acetaminophen (NORCO) 5-325 MG tablet Take 1 tablet by mouth every 4 (four) hours as needed for moderate pain. 30 tablet 0  . ibuprofen (ADVIL) 600 MG tablet Take 600 mg by mouth every 6 (six) hours as needed for moderate pain.    . metoprolol (LOPRESSOR) 50 MG tablet TAKE 1 TABLET BY MOUTH TWICE A DAY (Patient taking differently: Take 50 mg by mouth 2 (two) times daily.) 60 tablet 0  . Omega-3 Fatty Acids (FISH OIL) 1000 MG CAPS Take 1,000 mg by mouth daily.    . pravastatin (PRAVACHOL) 80 MG tablet Take 80 mg by mouth daily.    Marland Kitchen omeprazole (PRILOSEC) 20 MG capsule Take 1 capsule (20 mg total) by mouth daily. 90 capsule 1   No current facility-administered medications for this visit.    ALLERGIES:  No Known Allergies  PHYSICAL EXAM:  Performance status (ECOG): 0 - Asymptomatic  Vitals:   08/09/20 1205  BP: (!) 145/87  Pulse: 97  Resp: 18  Temp: 98.5 F (36.9 C)  SpO2: 98%   Wt Readings from Last 3 Encounters:  08/09/20 193 lb 12.6 oz (87.9 kg)  01/30/20 187 lb 8 oz (85 kg)  01/23/20 192 lb (87.1 kg)   Physical Exam Vitals reviewed.  Constitutional:       Appearance: Normal appearance.  Cardiovascular:     Rate and Rhythm: Normal rate and regular rhythm.     Heart sounds: Normal heart sounds.  Pulmonary:     Effort: Pulmonary effort is normal.     Breath sounds: Normal breath sounds.  Abdominal:     General: There is no distension.     Palpations: Abdomen is soft. There is no mass.  Neurological:     General: No focal deficit present.     Mental Status: She is alert and oriented to person, place, and time.  Psychiatric:  Mood and Affect: Mood normal.        Behavior: Behavior normal.      LABORATORY DATA:  I have reviewed the labs as listed.  CBC Latest Ref Rng & Units 08/01/2020 01/25/2020 11/16/2019  WBC 4.0 - 10.5 K/uL 8.8 8.4 -  Hemoglobin 12.0 - 15.0 g/dL 31.5 40.0 86.7  Hematocrit 36.0 - 46.0 % 39.8 38.3 38.0  Platelets 150 - 400 K/uL 318 285 -   CMP Latest Ref Rng & Units 08/01/2020 01/25/2020 01/25/2020  Glucose 70 - 99 mg/dL 95 - 619(J)  BUN 6 - 20 mg/dL 14 - 16  Creatinine 0.93 - 1.00 mg/dL 2.67 1.24 5.80  Sodium 135 - 145 mmol/L 138 - 137  Potassium 3.5 - 5.1 mmol/L 3.9 - 3.6  Chloride 98 - 111 mmol/L 106 - 103  CO2 22 - 32 mmol/L 23 - 23  Calcium 8.9 - 10.3 mg/dL 9.1 - 9.2  Total Protein 6.5 - 8.1 g/dL 7.9 - 8.3(H)  Total Bilirubin 0.3 - 1.2 mg/dL 0.5 - 0.8  Alkaline Phos 38 - 126 U/L 79 - 85  AST 15 - 41 U/L 20 - 23  ALT 0 - 44 U/L 22 - 31    DIAGNOSTIC IMAGING:  I have independently reviewed the scans and discussed with the patient. CT Chest W Contrast  Result Date: 08/01/2020 CLINICAL DATA:  Follow-up mediastinal and right axillary adenopathy EXAM: CT CHEST WITH CONTRAST TECHNIQUE: Multidetector CT imaging of the chest was performed during intravenous contrast administration. CONTRAST:  71mL OMNIPAQUE IOHEXOL 300 MG/ML  SOLN COMPARISON:  01/25/2020, 07/12/2019 FINDINGS: Cardiovascular: Scattered aortic atherosclerosis. Normal heart size. Left coronary artery calcifications. No pericardial effusion.  Mediastinum/Nodes: Unchanged prevascular, AP window, pretracheal, subcarinal, and right hilar lymphadenopathy, an index prevascular node measuring 2.4 x 1.4 cm (series 2, image 43), an index pretracheal node measuring 2.3 x 1.4 cm (series 2, image 35). Thyroid gland, trachea, and esophagus demonstrate no significant findings. Lungs/Pleura: No significant change in mosaic attenuation of the airspaces. Mild diffuse bronchial wall thickening throughout. No pleural effusion or pneumothorax. Upper Abdomen: No acute abnormality. Musculoskeletal: No chest wall mass or suspicious bone lesions identified. IMPRESSION: 1. Unchanged mediastinal and right hilar lymphadenopathy, previously biopsied with benign reactive histology. 2. No significant change in mosaic attenuation of the airspaces and mild diffuse bronchial wall thickening throughout. Findings are most consistent with small airways disease. 3. Coronary artery disease. Aortic Atherosclerosis (ICD10-I70.0). Electronically Signed   By: Lauralyn Primes M.D.   On: 08/01/2020 13:53     ASSESSMENT:   1.  Mediastinal and right axillary adenopathy: -CT chest PE protocol on 07/12/2019 showed lymphadenopathy anterior to the carina, AP window, pretracheal, right hilar and several prominent subcarinal lymph nodes, largest measuring 2.4 cm.  Lung showed mosaic attenuation at several sites. -Evaluated by Dr. Isaiah Serge on the PET scan was done on 09/07/2019.  This showed 1.2 cm prevascular lymph node, right paratracheal lymph node, subcarinal lymph node, right hilar lymph node.  Right axilla lymph node measuring 0.9 cm short axis with SUV 5.8.  There is increased uptake in the distal esophagus, nonspecific.  1.7 cm peripancreatic lymph node with SUV max of 2.68. -Right axillary lymph node biopsy on 09/21/2019 showed limited lymphoid tissue.  Lymphoid tissue primarily composed of small round to slightly irregular lymphocytes mixed with variable number of plasma cells.  No well-formed  granuloma or malignancy noted. -She smokes about 8 cigarettes/day for 25 years.  She reports brownish expectoration and small amounts for a  long time. -Right axillary lymph node biopsy on 11/16/2019 shows reactive lymphoid hyperplasia. -CT chest on 01/25/2020 showed persistent but stable appearance of mediastinal and right hilar adenopathy.  Subtle perifissural micronodularity is suspected.  Mild mosaic attenuation pattern which may be seen with small airway disease. Most recent CT chest with unchanged lymphadenopathy, previously biopsied with benign reactive histology.   PLAN:  1.  Mediastinal and right axillary adenopathy: -She does not report any B symptoms. -I reviewed CT of the chest with contrast dated 08/01/2020 results which showed unchanged mediastinal and right hilar LN. -I have reviewed her labs which are grossly within normal limits.  LDH is minimally elevated at 203 and not concerning -She can RTC in 6 months with labs. - I reviewed her CT and PET images, no significant change in adenopathy but she does have considerable sized LN, I would continue FU with image in 6 months, but I will discuss with Dr Kirtland Bouchard as well. Age appropriate cancer screening discussed.   Orders placed this encounter:  No orders of the defined types were placed in this encounter.   Rachel Moulds MD

## 2020-11-22 IMAGING — CT CT CHEST W/ CM
2 of 3 series · 15 of 36 positions shown, 18 images · IV contrast (Omnipaque or Isovue)
Comparison: CT chest 07/12/2019.

CLINICAL DATA: Follow-up mediastinal and axillary adenopathy.

EXAM:
CT CHEST WITH CONTRAST
TECHNIQUE: Multidetector CT imaging of the chest was performed during
intravenous contrast administration.
CONTRAST:  75mL OMNIPAQUE IOHEXOL 300 MG/ML  SOLN

[Series 3: routine chest with · axial · 0.54mm/px · z∈[+1319,+1549]mm · 12 of 135 slices shown, 15 images]
[im 10/135  mediastinal]
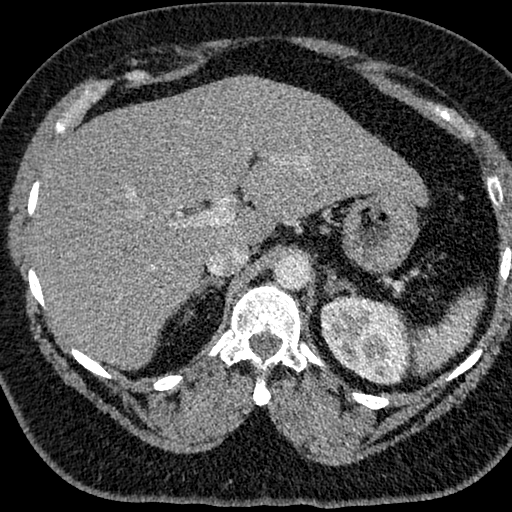
[im 10/135  lung]
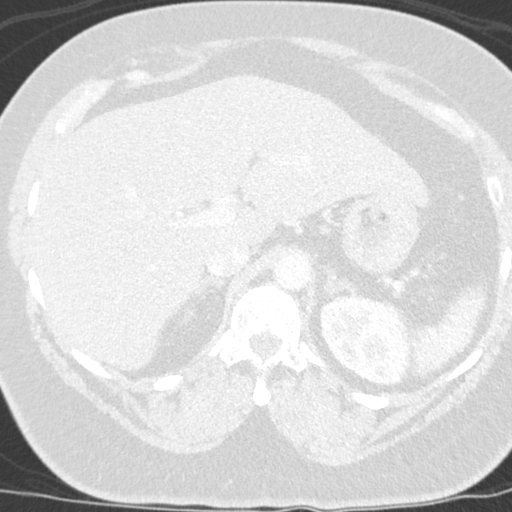
[im 20/135  lung]
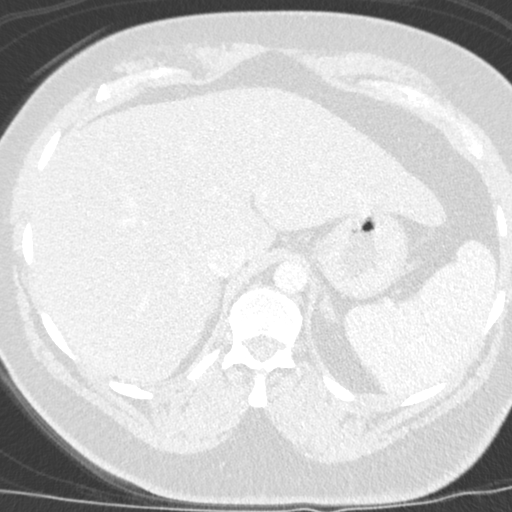
[im 30/135  lung]
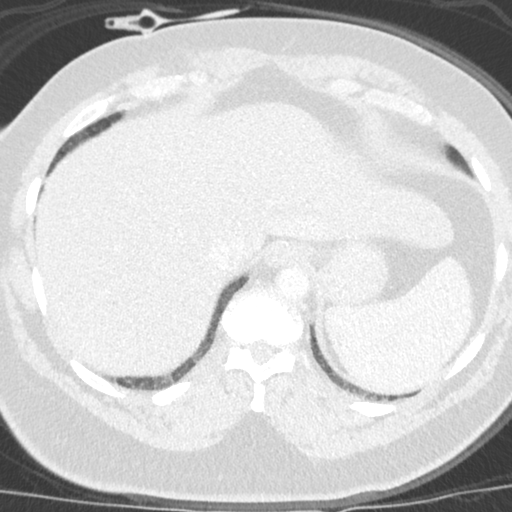
[im 40/135  lung]
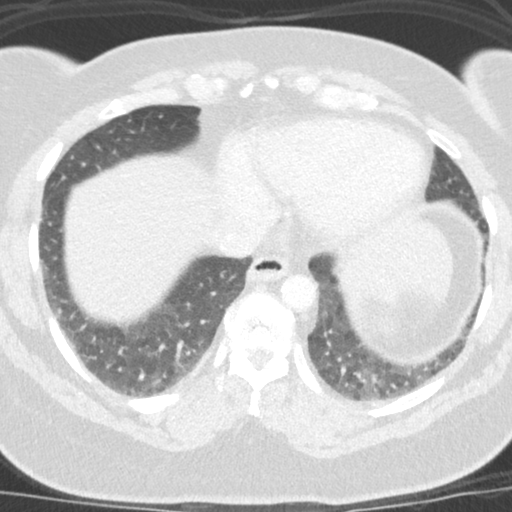
[im 50/135  mediastinal]
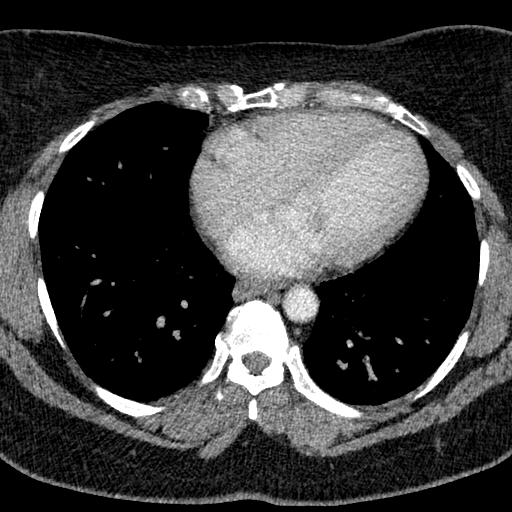
[im 50/135  lung]
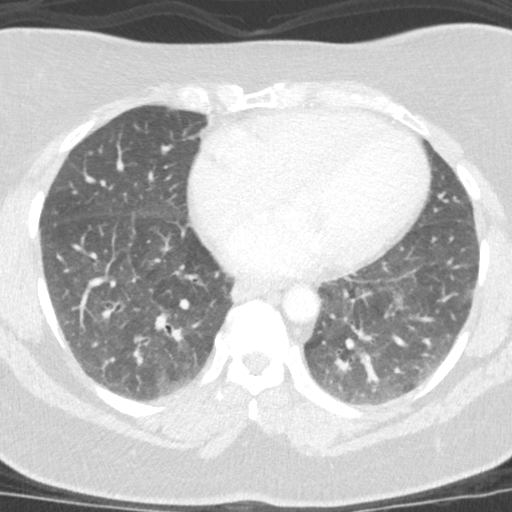
[im 60/135  lung]
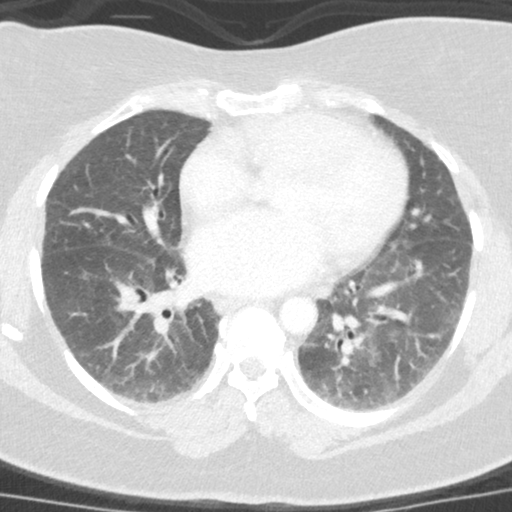
[im 75/135  lung]
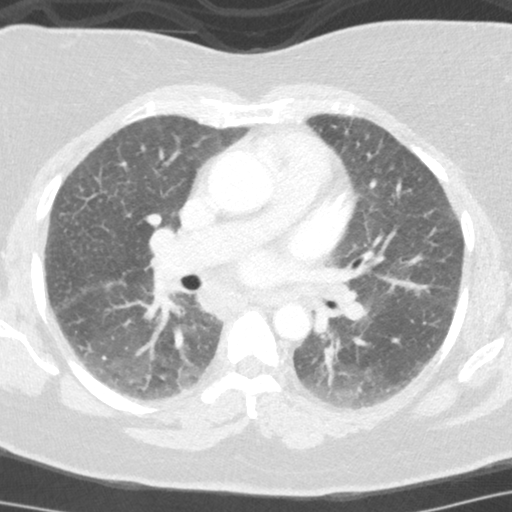
[im 85/135  lung]
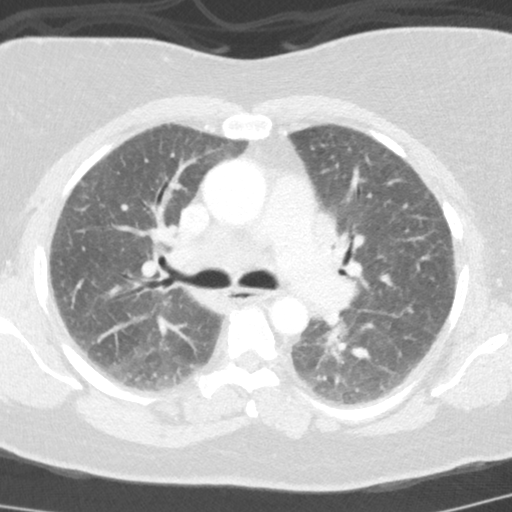
[im 95/135  mediastinal]
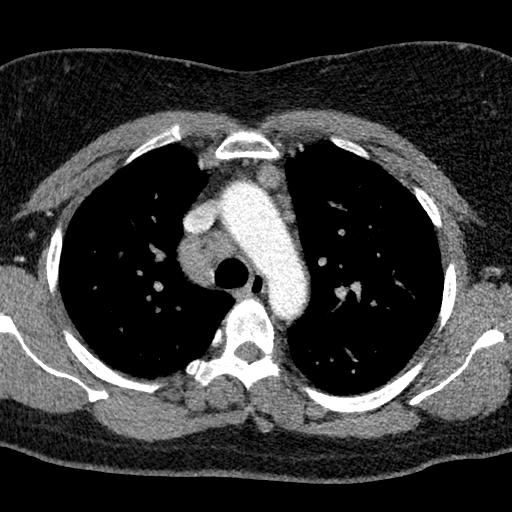
[im 95/135  lung]
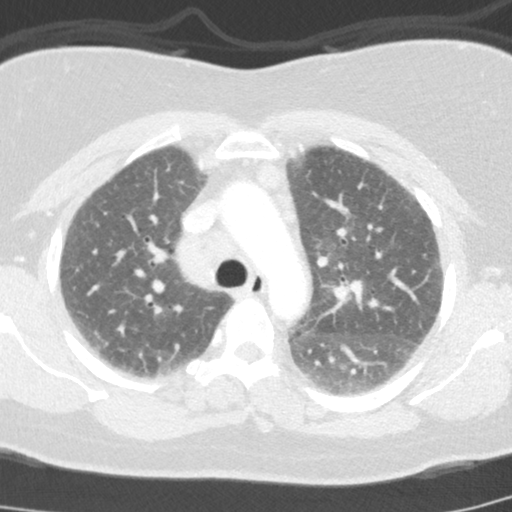
[im 105/135  lung]
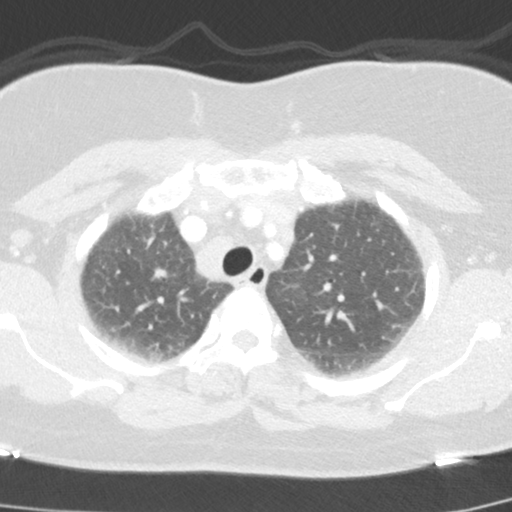
[im 115/135  lung]
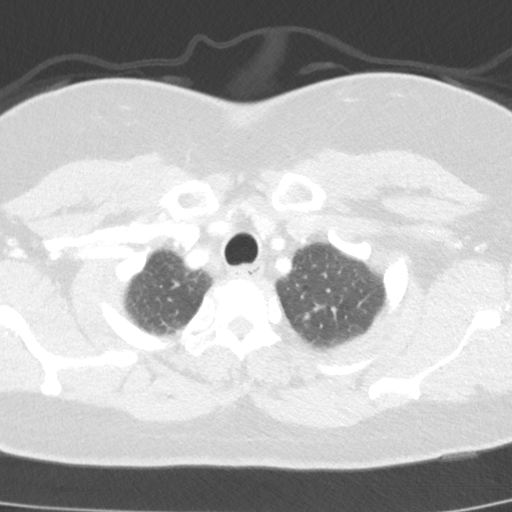
[im 125/135  lung]
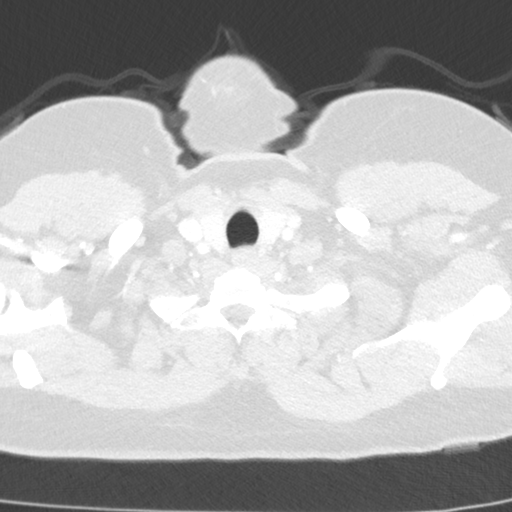

[Series 6: coronal · coronal · 0.53mm/px · 3 of 134 slices shown]
[im 27/134  lung]
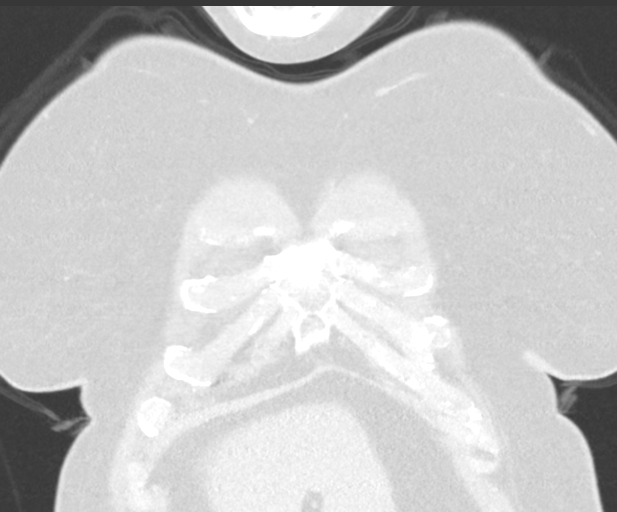
[im 54/134  lung]
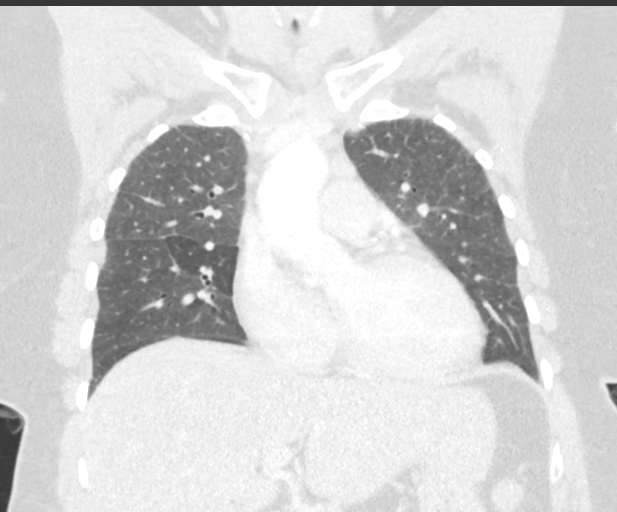
[im 80/134  lung]
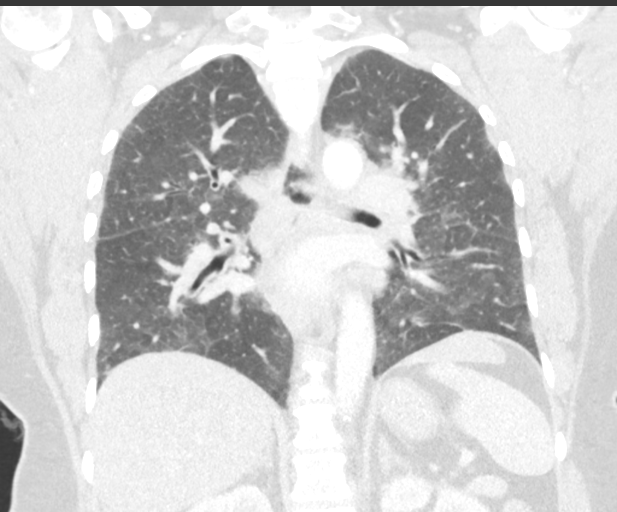

[15 of 36 positions shown; findings below may reference images not displayed]

FINDINGS: Cardiovascular: Mild cardiac enlargement. No pericardial effusion.
Calcifications in the LAD and left circumflex coronary arteries.

Mediastinum/Nodes: Normal appearance of the thyroid gland. The
trachea appears patent and is midline. Normal appearance of the
esophagus. Small bilateral supraclavicular and axillary lymph nodes
are again noted, similar to previous exam.

Mediastinal adenopathy is again noted. High right paratracheal lymph
node measures 1.4 cm, image [DATE]. Stable.

Low right paratracheal lymph node measures 2.3 cm, image 49/3.
Previously this measured the same.

As ago esophageal lymph node measures 1.9 cm, image 59/3. Stable.
1.9 cm right hilar lymph node is unchanged, image 56/3.

AP window lymph node measures 1.6 cm, image 54/3.  Also stable.

Lungs/Pleura: No pleural effusion identified. No airspace
consolidation, atelectasis or pneumothorax. Bilateral perifissural
micro nodularity noted. Diffuse interstitial prominence is
identified with a lower lung zone gradient no suspicious pulmonary
nodules identified. A mild mosaic attenuation pattern is also noted.

Upper Abdomen: No acute abnormality.

Musculoskeletal: No chest wall abnormality. No acute or significant
osseous findings.
IMPRESSION: 1. Persistent but stable appearance of mediastinal and right hilar
adenopathy.
2. Subtle, perifissural micronodularity is suspected. Perilymphatic
nodularity may be seen with pulmonary sarcoid.
3. Mild mosaic attenuation pattern which may be seen with small
airways disease.
4. Aortic atherosclerosis, in addition to lad and left circumflex
coronary artery disease. Please note that although the presence of
coronary artery calcium documents the presence of coronary artery
disease, the severity of this disease and any potential stenosis
cannot be assessed on this non-gated CT examination. Assessment for
potential risk factor modification, dietary therapy or pharmacologic
therapy may be warranted, if clinically indicated.

Aortic Atherosclerosis ([id number]).

## 2021-01-21 ENCOUNTER — Emergency Department (HOSPITAL_COMMUNITY): Payer: Commercial Managed Care - PPO

## 2021-01-21 ENCOUNTER — Emergency Department (HOSPITAL_COMMUNITY)
Admission: EM | Admit: 2021-01-21 | Discharge: 2021-01-21 | Disposition: A | Discharge status: Home / Self Care | Payer: Commercial Managed Care - PPO | Attending: Emergency Medicine | Admitting: Emergency Medicine

## 2021-01-21 DIAGNOSIS — Z7982 Long term (current) use of aspirin: Secondary | ICD-10-CM | POA: Insufficient documentation

## 2021-01-21 DIAGNOSIS — I10 Essential (primary) hypertension: Secondary | ICD-10-CM | POA: Diagnosis not present

## 2021-01-21 DIAGNOSIS — R6883 Chills (without fever): Secondary | ICD-10-CM | POA: Diagnosis not present

## 2021-01-21 DIAGNOSIS — R0602 Shortness of breath: Secondary | ICD-10-CM | POA: Diagnosis not present

## 2021-01-21 DIAGNOSIS — F17210 Nicotine dependence, cigarettes, uncomplicated: Secondary | ICD-10-CM | POA: Diagnosis not present

## 2021-01-21 DIAGNOSIS — Z79899 Other long term (current) drug therapy: Secondary | ICD-10-CM | POA: Diagnosis not present

## 2021-01-21 DIAGNOSIS — Z20822 Contact with and (suspected) exposure to covid-19: Secondary | ICD-10-CM | POA: Insufficient documentation

## 2021-01-21 DIAGNOSIS — R059 Cough, unspecified: Secondary | ICD-10-CM | POA: Insufficient documentation

## 2021-01-21 LAB — TROPONIN I (HIGH SENSITIVITY)
Troponin I (High Sensitivity): 3 ng/L (ref <18)
Troponin I (High Sensitivity): 4 ng/L (ref <18)

## 2021-01-21 LAB — CBC WITH DIFFERENTIAL/PLATELET
Abs Immature Granulocytes: 0.03 10*3/uL (ref 0.00–0.07)
Basophils Absolute: 0 10*3/uL (ref 0.0–0.1)
Basophils Relative: 0 %
Eosinophils Absolute: 0.1 10*3/uL (ref 0.0–0.5)
Eosinophils Relative: 2 %
HCT: 36.4 % (ref 36.0–46.0)
Hemoglobin: 11.8 g/dL — ABNORMAL LOW (ref 12.0–15.0)
Immature Granulocytes: 0 %
Lymphocytes Relative: 26 %
Lymphs Abs: 2.1 10*3/uL (ref 0.7–4.0)
MCH: 30.6 pg (ref 26.0–34.0)
MCHC: 32.4 g/dL (ref 30.0–36.0)
MCV: 94.3 fL (ref 80.0–100.0)
Monocytes Absolute: 0.7 10*3/uL (ref 0.1–1.0)
Monocytes Relative: 9 %
Neutro Abs: 5.2 10*3/uL (ref 1.7–7.7)
Neutrophils Relative %: 63 %
Platelets: 292 10*3/uL (ref 150–400)
RBC: 3.86 MIL/uL — ABNORMAL LOW (ref 3.87–5.11)
RDW: 15.6 % — ABNORMAL HIGH (ref 11.5–15.5)
WBC: 8.2 10*3/uL (ref 4.0–10.5)
nRBC: 0 % (ref 0.0–0.2)

## 2021-01-21 LAB — BASIC METABOLIC PANEL
Anion gap: 7 (ref 5–15)
BUN: 11 mg/dL (ref 6–20)
CO2: 24 mmol/L (ref 22–32)
Calcium: 8.7 mg/dL — ABNORMAL LOW (ref 8.9–10.3)
Chloride: 106 mmol/L (ref 98–111)
Creatinine, Ser: 0.75 mg/dL (ref 0.44–1.00)
GFR, Estimated: >60 mL/min (ref >60)
Glucose, Bld: 86 mg/dL (ref 70–99)
Potassium: 3.4 mmol/L — ABNORMAL LOW (ref 3.5–5.1)
Sodium: 137 mmol/L (ref 135–145)

## 2021-01-21 LAB — BLOOD GAS, VENOUS
Acid-Base Excess: 0.6 mmol/L (ref 0.0–2.0)
Bicarbonate: 24.4 mmol/L (ref 20.0–28.0)
FIO2: 21
O2 Saturation: 78.3 %
Patient temperature: 36.9
pCO2, Ven: 41.7 mmHg — ABNORMAL LOW (ref 44.0–60.0)
pH, Ven: 7.393 (ref 7.250–7.430)
pO2, Ven: 44.7 mmHg (ref 32.0–45.0)

## 2021-01-21 LAB — RESP PANEL BY RT-PCR (FLU A&B, COVID) ARPGX2
Influenza A by PCR: NEGATIVE
Influenza B by PCR: NEGATIVE
SARS Coronavirus 2 by RT PCR: NEGATIVE

## 2021-01-21 LAB — BRAIN NATRIURETIC PEPTIDE: B Natriuretic Peptide: 61 pg/mL (ref 0.0–100.0)

## 2021-01-21 LAB — D-DIMER, QUANTITATIVE: D-Dimer, Quant: 0.66 ug/mL-FEU — ABNORMAL HIGH (ref 0.00–0.50)

## 2021-01-21 MED ORDER — IOHEXOL 350 MG/ML SOLN
25.0000 mL | Freq: Once | INTRAVENOUS | Status: AC | PRN
  Administered 2021-01-21: 25 mL via INTRAVENOUS

## 2021-01-21 MED ORDER — ALBUTEROL SULFATE HFA 108 (90 BASE) MCG/ACT IN AERS
2.0000 | INHALATION_SPRAY | Freq: Once | RESPIRATORY_TRACT | Status: AC
  Administered 2021-01-21: 2 via RESPIRATORY_TRACT
  Filled 2021-01-21: qty 6.7

## 2021-01-21 MED ORDER — AEROCHAMBER PLUS FLO-VU SMALL MISC
1.0000 | Freq: Once | Status: AC
  Administered 2021-01-21: 1

## 2021-01-21 NOTE — ED Notes (Signed)
Patient to xray at this time

## 2021-01-21 NOTE — ED Notes (Signed)
O2 AT 95% WHILE SHE WALKED TO BATHROOM AND BACK TO ROOM.

## 2021-01-21 NOTE — ED Triage Notes (Signed)
Pt reports SHOB x 1 week. Denies fevers.

## 2021-01-21 NOTE — Discharge Instructions (Signed)
You were seen in the emergency department today for your shortness of breath.  Your physical exam, vital signs, blood work, and CT scan were very reassuring.  There is no blood clot in your lungs, there is not a significant mount of fluid in your lungs, and there is no sign of stress on your heart based on your blood work and EKG.  While the exact cause of your shortness of breath remains unclear, there does not appear to be an emergent problem at this time.  I do feel it is important a follow-up with the cardiologist; please see the information for the cardiology office listed below.  Call their office first thing this afternoon or tomorrow morning to schedule follow-up appointment.  Additionally below is the contact information for the cancer center, whom you should call to inform you had a CT of the chest today, and to schedule follow-up as needed for your lymphadenopathy.  Return to the ER for develop any worsening shortness of breath, chest pain, nausea or vomiting does not stop, palpitations, or any other new severe symptoms.

## 2021-01-21 NOTE — ED Provider Notes (Signed)
[facility] - Closed EMERGENCY DEPARTMENT Provider Note   CSN: [id number] Arrival date & time: 01/21/21  3149     History Chief Complaint  Patient presents with  . Shortness of Breath    Donna Fitzgerald is a 55 y.o. female who presents with concern for shortness of breath aggressive over the last week.  Also endorses cough with productive clear mucus, denies any fevers at home but does endorse chills.  Denies any nausea, vomiting, diarrhea, has been vaccinated for COVID-19. She endorses feeling similarly in the past which was diagnosed with pneumonia.  Of note patient does have history of mediastinal lymphadenopathy that has been stable on CT scans, most recent in 07/2020.  She is scheduled for repeat CT of the chest on 01/28/2021.  Biopsy was negative for malignancy, patient has not had any symptoms of lymphoma up to this point.  Have personally reviewed this patient's medical records.  She is history of hypertension, hyperlipidemia, and mediastinal lymphadenopathy.  She is not on any anticoagulation.  She denies any recent travel or prolonged immobilization, denies any hormone replacement, history of blood clot, denies any recent surgical procedure.   HPI     Past Medical History:  Diagnosis Date  . Acute respiratory failure with hypoxia (HCC) 10/26/2018  . Arthritis   . Difficult intubation   . GERD (gastroesophageal reflux disease)   . HLD (hyperlipidemia)   . Hypertension   . Influenza A 10/26/2018  . Pneumonia 10/26/2018    Patient Active Problem List   Diagnosis Date Noted  . Generalized lymphadenopathy   . Mediastinal lymphadenopathy 10/19/2019  . Tobacco use 10/26/2018  . S/P lumbar laminectomy 06/14/2016  . Difficult intubation 06/13/2016  . Radiculopathy of lumbar region 05/12/2016  . Insomnia 01/19/2013  . Low back pain 01/19/2013  . Essential hypertension     Past Surgical History:  Procedure Laterality Date  . AXILLARY LYMPH NODE BIOPSY Right 11/16/2019   Procedure:  AXILLARY LYMPH NODE BIOPSY;  Surgeon: Franky Macho, MD;  Location: [facility];  Service: General;  Laterality: Right;  . FINGER SURGERY Right 1995   [place]  . LUMBAR SPINE SURGERY       OB History    Gravida      Para      Term      Preterm      AB      Living  1     SAB      IAB      Ectopic      Multiple      Live Births              Family History  Problem Relation Age of Onset  . Hypertension Mother   . Alzheimer's disease Mother   . Diabetes Mother   . Heart disease Father   . Hypertension Father   . Hypertension Sister   . Heart disease Sister   . Heart disease Sister   . Hypertension Sister   . Colon cancer Neg Hx   . Esophageal cancer Neg Hx   . Stomach cancer Neg Hx   . Colon polyps Neg Hx     Social History   Tobacco Use  . Smoking status: Current Every Day Smoker    Packs/day: 0.50    Years: 30.00    Pack years: 15.00    Types: Cigarettes  . Smokeless tobacco: Never Used  Vaping Use  . Vaping Use: Some days  Substance Use Topics  . Alcohol use: Yes  Comment: ocassional on the weekends  . Drug use: No    Home Medications Prior to Admission medications   Medication Sig Start Date End Date Taking? Authorizing Provider  albuterol (PROVENTIL HFA;VENTOLIN HFA) 108 (90 Base) MCG/ACT inhaler Inhale 2 puffs into the lungs every 4 (four) hours as needed for wheezing or shortness of breath. 10/29/18  Yes Johnson, Clanford L, MD  amitriptyline (ELAVIL) 100 MG tablet TAKE 1 TABLET (100 MG TOTAL) BY MOUTH AT BEDTIME. Patient taking differently: Take 200 mg by mouth at bedtime. 11/14/13  Yes Allayne Butcher B, PA-C  amLODipine (NORVASC) 10 MG tablet Take 1 tablet (10 mg total) by mouth daily. 01/19/13  Yes Dorena Bodo, PA-C  aspirin 81 MG tablet Take 81 mg by mouth daily.   Yes [provider]  cholecalciferol (VITAMIN D3) 25 MCG (1000 UNIT) tablet Take 2,000 Units by mouth daily.   Yes [provider]  gabapentin (NEURONTIN) 300 MG  capsule Take 300 mg by mouth 3 (three) times daily. 12/02/19  Yes [provider]  ibuprofen (ADVIL) 600 MG tablet Take 600 mg by mouth every 6 (six) hours as needed for moderate pain. 08/03/19  Yes [provider]  metoprolol (LOPRESSOR) 50 MG tablet TAKE 1 TABLET BY MOUTH TWICE A DAY Patient taking differently: Take 50 mg by mouth 2 (two) times daily. 11/14/13  Yes Dorena Bodo, PA-C  omeprazole (PRILOSEC) 20 MG capsule Take 1 capsule (20 mg total) by mouth daily. 01/23/20 02/22/20 Yes Meredith Pel, NP  pravastatin (PRAVACHOL) 80 MG tablet Take 80 mg by mouth daily.   Yes [provider]  HYDROcodone-acetaminophen (NORCO) 5-325 MG tablet Take 1 tablet by mouth every 4 (four) hours as needed for moderate pain. Patient not taking: Reported on 01/21/2021 11/16/19   Franky Macho, MD    Allergies    Patient has no known allergies.  Review of Systems   Review of Systems  Constitutional: Positive for chills and fatigue. Negative for activity change, appetite change, diaphoresis and fever.  HENT: Negative.   Respiratory: Positive for cough, chest tightness and shortness of breath.   Cardiovascular: Negative for chest pain, palpitations and leg swelling.  Gastrointestinal: Negative.   Genitourinary: Negative.   Musculoskeletal: Negative.   Skin: Negative.   Neurological: Negative.     Physical Exam Updated Vital Signs BP 106/67   Pulse 74   Temp 98.3 F (36.8 C) (Oral)   Resp 16   Ht  (1.575 m)   Wt 88.5 kg   LMP 05/04/2013   SpO2 94%   BMI 35.67 kg/m   Physical Exam Vitals and nursing note reviewed.  Constitutional:      General: She is not in acute distress.    Appearance: She is obese. She is ill-appearing. She is not toxic-appearing.  HENT:     Head: Normocephalic and atraumatic.     Nose: Nose normal.     Mouth/Throat:     Mouth: Mucous membranes are moist.     Pharynx: Oropharynx is clear. Uvula midline. No oropharyngeal exudate,  posterior oropharyngeal erythema or uvula swelling.     Tonsils: No tonsillar exudate.  Eyes:     General: Lids are normal. Vision grossly intact.        Right eye: No discharge.        Left eye: No discharge.     Extraocular Movements: Extraocular movements intact.     Conjunctiva/sclera: Conjunctivae normal.     Pupils: Pupils are equal,  round, and reactive to light.  Neck:     Trachea: Trachea and phonation normal.  Cardiovascular:     Rate and Rhythm: Normal rate and regular rhythm.     Pulses: Normal pulses.     Heart sounds: Normal heart sounds. No murmur heard.   Pulmonary:     Effort: Pulmonary effort is normal. Tachypnea present. No bradypnea, accessory muscle usage, prolonged expiration, respiratory distress or retractions.     Breath sounds: Examination of the left-lower field reveals decreased breath sounds. Decreased breath sounds present. No wheezing or rales.  Chest:     Chest wall: No mass, lacerations, deformity, swelling, tenderness, crepitus or edema.  Abdominal:     General: Bowel sounds are normal. There is no distension.     Palpations: Abdomen is soft.     Tenderness: There is no abdominal tenderness. There is no right CVA tenderness, left CVA tenderness, guarding or rebound.  Musculoskeletal:        General: No deformity.     Cervical back: Normal range of motion and neck supple. No edema, rigidity or crepitus. No pain with movement, spinous process tenderness or muscular tenderness.     Right lower leg: No edema.     Left lower leg: No edema.  Lymphadenopathy:     Cervical: No cervical adenopathy.  Skin:    General: Skin is warm and dry.     Capillary Refill: Capillary refill takes less than 2 seconds.  Neurological:     Mental Status: She is alert. Mental status is at baseline.     Sensory: Sensation is intact.     Motor: Motor function is intact.     Gait: Gait is intact.  Psychiatric:        Mood and Affect: Mood normal.     ED Results /  Procedures / Treatments   Labs (all labs ordered are listed, but only abnormal results are displayed) Labs Reviewed  CBC WITH DIFFERENTIAL/PLATELET - Abnormal; Notable for the following components:      Result Value   RBC 3.86 (*)    Hemoglobin 11.8 (*)    RDW 15.6 (*)    All other components within normal limits  BASIC METABOLIC PANEL - Abnormal; Notable for the following components:   Potassium 3.4 (*)    Calcium 8.7 (*)    All other components within normal limits  BLOOD GAS, VENOUS - Abnormal; Notable for the following components:   pCO2, Ven 41.7 (*)    All other components within normal limits  D-DIMER, QUANTITATIVE - Abnormal; Notable for the following components:   D-Dimer, Quant 0.66 (*)    All other components within normal limits  RESP PANEL BY RT-PCR (FLU A&B, COVID) ARPGX2  BRAIN NATRIURETIC PEPTIDE  TROPONIN I (HIGH SENSITIVITY)  TROPONIN I (HIGH SENSITIVITY)    EKG EKG: sinus rhythm, no STEMI.  Radiology DG Chest 2 View  Result Date: 01/21/2021 CLINICAL DATA:  55 year old female with shortness of breath for 1 week. EXAM: CHEST - 2 VIEW COMPARISON:  Chest CT 08/01/2020 and earlier. FINDINGS: Upright PA and lateral views. Stable lung volumes and mediastinal contours remain normal. Coarse bilateral pulmonary interstitial opacity has not significantly changed since 2020. Visualized tracheal air column is within normal limits. Mild soft tissue thickening along the right minor fissure is new since 2020 but stable from the December CT. No pneumothorax, pulmonary edema, pleural effusion or acute pulmonary opacity. No acute osseous abnormality identified. Negative visible bowel gas pattern. IMPRESSION: Chronic pulmonary  interstitial changes, stable since a CT in December. No acute cardiopulmonary abnormality. Electronically Signed   By: Odessa Fleming M.D.   On: 01/21/2021 09:19   CT Angio Chest PE W/Cm &/Or Wo Cm  Result Date: 01/21/2021 CLINICAL DATA:  Evaluate for pulmonary  embolus. EXAM: CT ANGIOGRAPHY CHEST WITH CONTRAST TECHNIQUE: Multidetector CT imaging of the chest was performed using the standard protocol during bolus administration of intravenous contrast. Multiplanar CT image reconstructions and MIPs were obtained to evaluate the vascular anatomy. CONTRAST:  100 cc of Omnipaque 350 COMPARISON:  CT chest 08/01/2020 FINDINGS: Cardiovascular: Satisfactory opacification of the pulmonary arteries to the segmental level. No evidence of pulmonary embolism. Heart size upper limits of normal. Reflux of contrast material from the right heart into the IVC and hepatic veins identified compatible with passive venous congestion which may be seen with right heart failure. No pericardial effusion. Coronary artery calcifications noted. Mediastinum/Nodes: Normal appearance of the thyroid gland. The trachea appears patent and is midline. Normal appearance of the esophagus. Mediastinal and hilar adenopathy is again noted as described previously. Left pre-vascular lymph node measures 1.3 cm, image 33/4. Previously 1.1 cm. Right paratracheal lymph node measures 1.4 cm, image 25/4. Unchanged. Subcarinal lymph node measures 1.7 cm, image 44/4. Previously 1.7 cm. Lungs/Pleura: There is diffuse ground-glass attenuation and interlobular septal thickening throughout both lungs compatible with interstitial edema. Mild diffuse bronchial wall thickening. No superimposed airspace consolidation. No pleural effusion. Upper Abdomen: No acute abnormality. Musculoskeletal: No chest wall abnormality. No acute or significant osseous findings. Review of the MIP images confirms the above findings. IMPRESSION: 1. No evidence for acute pulmonary embolus. 2. Cardiac enlargement, and diffuse ground-glass attenuation and interlobular septal thickening compatible with interstitial edema. Correlate for any clinical signs or symptoms of heart failure. 3. Coronary artery calcifications noted. 4. No change in the appearance of  mediastinal and hilar adenopathy. This is been present since 07/12/2019 and although etiology is indeterminate the relative stability favors a benign etiology. 5. Aortic atherosclerosis. Aortic Atherosclerosis (ICD10-I70.0). Electronically Signed   By: Signa Kell M.D.   On: 01/21/2021 12:51    Procedures Procedures  Medications Ordered in ED Medications  albuterol (VENTOLIN HFA) 108 (90 Base) MCG/ACT inhaler 2 puff (2 puffs Inhalation Given 01/21/21 1141)  AeroChamber Plus Flo-Vu Small device MISC 1 each (1 each Other Given by Other 01/21/21 1306)  iohexol (OMNIPAQUE) 350 MG/ML injection 25 mL (25 mLs Intravenous Contrast Given 01/21/21 1228)  iohexol (OMNIPAQUE) 350 MG/ML injection 25 mL (25 mLs Intravenous Contrast Given 01/21/21 1227)  iohexol (OMNIPAQUE) 350 MG/ML injection 25 mL (25 mLs Intravenous Contrast Given 01/21/21 1227)  iohexol (OMNIPAQUE) 350 MG/ML injection 25 mL (25 mLs Intravenous Contrast Given 01/21/21 1227)    ED Course  I have reviewed the triage vital signs and the nursing notes.  Pertinent labs & imaging results that were available during my care of the patient were reviewed by me and considered in my medical decision making (see chart for details).    MDM Rules/Calculators/A&P                         55 year old female who presents with concern for progressively worsening shortness of breath over the last week.  History of mediastinal lymphadenopathy monitored by oncology, no malignancy identified on biopsy in the past.  Differential diagnosis includes but is limited to pleural effusion, pulmonary edema, pneumonia, PE, ACS, arrhythmia, progression of her lymphadenopathy, mass, bronchospasm, anemia, CHF.  Hypertensive and  tachypneic on intake.  Cardiopulmonary exam is significant only for mildly decreased breath sounds in the lung bases, abdominal exam is benign.  Patient is neurovascularly intact all 4 extremities. No lower extremity edema.  CBC with anemia, hemoglobin  11.8.  BMP with mild hypokalemia 3.4, otherwise unremarkable.  VBG without acidosis, troponin is normal, 3, delta Trope normal, 4.  BNP unremarkable.  Respiratory pathogen panel negative.  D-dimer was elevated to 0.66.  Chest x-ray is negative for acute cardiopulmonary disease, CT angiogram was obtained and was negative for PE.  Mediastinal and hilar lymphadenopathy with stable from prior studies.  There is diffuse groundglass attenuation compatible with interstitial edema, however appears clinically stable.    Patient reevaluated after ministration of albuterol with resolution diminished breath sounds in the lung bases.  She was ambulated with maintenance of her oxygen saturation greater than 95% on room air and without significant tachycardia.  No further work-up warranted in the ED as time.  Doubt ACS given normal troponins and EKG, and no PE identified on CT angiogram, no increase in patient's of adenopathy, no mass.  Very mild anemia, however would not explain shortness of breath.  Or the exact cause of the patient's shortness of breath remains unclear, there is not appear to be any emergent etiology at this time.  No further work-up warranted at this time.  We will have patient follow-up with cardiology.  Donna Fitzgerald voiced understanding of her medical evaluation and treatment plan.  Each of her questions was answered to her expressed satisfaction.  Strict return precautions given.  Patient is stable and appropriate for discharge at this time.  This chart was dictated using voice recognition software, Dragon. Despite the best efforts of this provider to proofread and correct errors, errors may still occur which can change documentation meaning.  Final Clinical Impression(s) / ED Diagnoses Final diagnoses:  Shortness of breath    Rx / DC Orders ED Discharge Orders    None       Sherrilee GillesSponseller, Keyla Milone R, PA-C 01/21/21 1901    Derwood KaplanNanavati, Ankit, MD 01/22/21 1253

## 2021-01-25 ENCOUNTER — Other Ambulatory Visit (HOSPITAL_COMMUNITY): Payer: Self-pay | Admitting: *Deleted

## 2021-01-25 DIAGNOSIS — R599 Enlarged lymph nodes, unspecified: Secondary | ICD-10-CM

## 2021-01-25 DIAGNOSIS — R590 Localized enlarged lymph nodes: Secondary | ICD-10-CM

## 2021-01-28 ENCOUNTER — Other Ambulatory Visit: Payer: Self-pay

## 2021-01-28 ENCOUNTER — Inpatient Hospital Stay (HOSPITAL_COMMUNITY): Payer: Commercial Managed Care - PPO | Attending: Hematology

## 2021-01-28 ENCOUNTER — Ambulatory Visit (HOSPITAL_COMMUNITY): Payer: Commercial Managed Care - PPO

## 2021-01-28 DIAGNOSIS — R590 Localized enlarged lymph nodes: Secondary | ICD-10-CM | POA: Insufficient documentation

## 2021-01-28 DIAGNOSIS — R599 Enlarged lymph nodes, unspecified: Secondary | ICD-10-CM

## 2021-01-28 LAB — CBC WITH DIFFERENTIAL/PLATELET
Abs Immature Granulocytes: 0.03 10*3/uL (ref 0.00–0.07)
Basophils Absolute: 0.1 10*3/uL (ref 0.0–0.1)
Basophils Relative: 1 %
Eosinophils Absolute: 0.2 10*3/uL (ref 0.0–0.5)
Eosinophils Relative: 2 %
HCT: 39.8 % (ref 36.0–46.0)
Hemoglobin: 12.9 g/dL (ref 12.0–15.0)
Immature Granulocytes: 0 %
Lymphocytes Relative: 30 %
Lymphs Abs: 2.5 10*3/uL (ref 0.7–4.0)
MCH: 30.1 pg (ref 26.0–34.0)
MCHC: 32.4 g/dL (ref 30.0–36.0)
MCV: 93 fL (ref 80.0–100.0)
Monocytes Absolute: 0.7 10*3/uL (ref 0.1–1.0)
Monocytes Relative: 9 %
Neutro Abs: 4.7 10*3/uL (ref 1.7–7.7)
Neutrophils Relative %: 58 %
Platelets: 352 10*3/uL (ref 150–400)
RBC: 4.28 MIL/uL (ref 3.87–5.11)
RDW: 15.8 % — ABNORMAL HIGH (ref 11.5–15.5)
WBC: 8.2 10*3/uL (ref 4.0–10.5)
nRBC: 0 % (ref 0.0–0.2)

## 2021-01-28 LAB — FOLATE: Folate: 11.4 ng/mL (ref >5.9)

## 2021-01-28 LAB — COMPREHENSIVE METABOLIC PANEL
ALT: 26 U/L (ref 0–44)
AST: 23 U/L (ref 15–41)
Albumin: 3.8 g/dL (ref 3.5–5.0)
Alkaline Phosphatase: 80 U/L (ref 38–126)
Anion gap: 6 (ref 5–15)
BUN: 15 mg/dL (ref 6–20)
CO2: 24 mmol/L (ref 22–32)
Calcium: 9.1 mg/dL (ref 8.9–10.3)
Chloride: 105 mmol/L (ref 98–111)
Creatinine, Ser: 0.88 mg/dL (ref 0.44–1.00)
GFR, Estimated: >60 mL/min (ref >60)
Glucose, Bld: 99 mg/dL (ref 70–99)
Potassium: 4 mmol/L (ref 3.5–5.1)
Sodium: 135 mmol/L (ref 135–145)
Total Bilirubin: 0.5 mg/dL (ref 0.3–1.2)
Total Protein: 8 g/dL (ref 6.5–8.1)

## 2021-01-28 LAB — VITAMIN B12: Vitamin B-12: 1113 pg/mL — ABNORMAL HIGH (ref 180–914)

## 2021-01-28 LAB — IRON AND TIBC
Iron: 58 ug/dL (ref 28–170)
Saturation Ratios: 15 % (ref 10.4–31.8)
TIBC: 379 ug/dL (ref 250–450)
UIBC: 321 ug/dL

## 2021-01-28 LAB — FERRITIN: Ferritin: 204 ng/mL (ref 11–307)

## 2021-01-28 LAB — LACTATE DEHYDROGENASE: LDH: 220 U/L — ABNORMAL HIGH (ref 98–192)

## 2021-02-01 ENCOUNTER — Other Ambulatory Visit (HOSPITAL_COMMUNITY): Payer: Self-pay | Admitting: *Deleted

## 2021-02-01 DIAGNOSIS — R590 Localized enlarged lymph nodes: Secondary | ICD-10-CM

## 2021-02-04 ENCOUNTER — Inpatient Hospital Stay (HOSPITAL_COMMUNITY): Payer: Commercial Managed Care - PPO | Admitting: Hematology

## 2021-02-22 ENCOUNTER — Inpatient Hospital Stay (HOSPITAL_COMMUNITY): Payer: Commercial Managed Care - PPO | Attending: Hematology | Admitting: Hematology and Oncology

## 2021-02-22 ENCOUNTER — Other Ambulatory Visit: Payer: Self-pay

## 2021-02-22 ENCOUNTER — Encounter (HOSPITAL_COMMUNITY): Payer: Self-pay | Admitting: Hematology and Oncology

## 2021-02-22 VITALS — BP 135/75 | Temp 98.4°F | Resp 16 | Wt 193.7 lb

## 2021-02-22 DIAGNOSIS — R590 Localized enlarged lymph nodes: Secondary | ICD-10-CM | POA: Diagnosis not present

## 2021-02-22 DIAGNOSIS — Z8701 Personal history of pneumonia (recurrent): Secondary | ICD-10-CM | POA: Insufficient documentation

## 2021-02-22 DIAGNOSIS — I10 Essential (primary) hypertension: Secondary | ICD-10-CM | POA: Insufficient documentation

## 2021-02-22 DIAGNOSIS — Z79899 Other long term (current) drug therapy: Secondary | ICD-10-CM | POA: Diagnosis not present

## 2021-02-22 DIAGNOSIS — K219 Gastro-esophageal reflux disease without esophagitis: Secondary | ICD-10-CM | POA: Diagnosis not present

## 2021-02-22 DIAGNOSIS — E785 Hyperlipidemia, unspecified: Secondary | ICD-10-CM | POA: Diagnosis not present

## 2021-02-22 DIAGNOSIS — Z7982 Long term (current) use of aspirin: Secondary | ICD-10-CM | POA: Diagnosis not present

## 2021-02-22 NOTE — Progress Notes (Signed)
[facility] [address], [place] [address]   CLINIC:  Medical Oncology/Hematology  PCP:  Center, [facility] [address] [place] [address] [phone]   REASON FOR VISIT:  Follow-up for mediastinal and right axillary adenopathy  PRIOR THERAPY: None  CURRENT THERAPY: Observation  BRIEF ONCOLOGIC HISTORY:  Oncology History   No history exists.    CANCER STAGING: Cancer Staging No matching staging information was found for the patient.  INTERVAL HISTORY:  [name], a 55 y.o. female, returns for routine follow-up of her mediastinal and right axillary adenopathy.   Patient is here for follow-up by herself.  Since last visit, she denies any interim hospitalizations.  She feels well except for fatigue which is a chronic complaint, this is likely from lack of good sleep.  No fevers, drenching night sweats, loss of appetite or loss of weight.  No change in breathing, bowel habits or urinary habits.  She is not up-to-date with her age-appropriate cancer screening. Rest of the pertinent 10 point ROS as mentioned below.  REVIEW OF SYSTEMS:  Review of Systems  Constitutional:  Positive for fatigue. Negative for appetite change, chills, diaphoresis, fever and unexpected weight change.  Respiratory:  Negative for cough.   All other systems reviewed and are negative.  PAST MEDICAL/SURGICAL HISTORY:  Past Medical History:  Diagnosis Date   Acute respiratory failure with hypoxia (HCC) 10/26/2018   Arthritis    Difficult intubation    GERD (gastroesophageal reflux disease)    HLD (hyperlipidemia)    Hypertension    Influenza A 10/26/2018   Pneumonia 10/26/2018   Past Surgical History:  Procedure Laterality Date   AXILLARY LYMPH NODE BIOPSY Right 11/16/2019   Procedure: AXILLARY LYMPH NODE BIOPSY;  Surgeon: Franky Macho, MD;  Location: [facility];  Service: General;  Laterality: Right;   FINGER SURGERY Right 1995   [place]    LUMBAR SPINE SURGERY      SOCIAL HISTORY:  Social History   Socioeconomic History   Marital status: Married    Spouse name: Earvin Hansen   Number of children: 1   Years of education: Not on file   Highest education level: Not on file  Occupational History   Occupation: CNA  Tobacco Use   Smoking status: Every Day    Packs/day: 0.50    Years: 30.00    Pack years: 15.00    Types: Cigarettes   Smokeless tobacco: Never  Vaping Use   Vaping Use: Some days  Substance and Sexual Activity   Alcohol use: Yes    Comment: ocassional on the weekends   Drug use: No   Sexual activity: Not Currently  Other Topics Concern   Not on file  Social History Narrative   Not on file   Social Determinants of Health   Financial Resource Strain: Not on file  Food Insecurity: Not on file  Transportation Needs: Not on file  Physical Activity: Not on file  Stress: Not on file  Social Connections: Not on file  Intimate Partner Violence: Not on file    FAMILY HISTORY:  Family History  Problem Relation Age of Onset   Hypertension Mother    Alzheimer's disease Mother    Diabetes Mother    Heart disease Father    Hypertension Father    Hypertension Sister    Heart disease Sister    Heart disease Sister    Hypertension Sister    Colon cancer Neg Hx  Esophageal cancer Neg Hx    Stomach cancer Neg Hx    Colon polyps Neg Hx     CURRENT MEDICATIONS:  Current Outpatient Medications  Medication Sig Dispense Refill   amitriptyline (ELAVIL) 100 MG tablet TAKE 1 TABLET (100 MG TOTAL) BY MOUTH AT BEDTIME. (Patient taking differently: Take 200 mg by mouth at bedtime.) 30 tablet 0   amLODipine (NORVASC) 10 MG tablet Take 1 tablet (10 mg total) by mouth daily. 90 tablet 3   aspirin 81 MG tablet Take 81 mg by mouth daily.     cholecalciferol (VITAMIN D3) 25 MCG (1000 UNIT) tablet Take 2,000 Units by mouth daily.     gabapentin (NEURONTIN) 300 MG capsule Take 300 mg by mouth 3 (three) times daily.      metoprolol (LOPRESSOR) 50 MG tablet TAKE 1 TABLET BY MOUTH TWICE A DAY (Patient taking differently: Take 50 mg by mouth 2 (two) times daily.) 60 tablet 0   pravastatin (PRAVACHOL) 80 MG tablet Take 80 mg by mouth daily.     albuterol (PROVENTIL HFA;VENTOLIN HFA) 108 (90 Base) MCG/ACT inhaler Inhale 2 puffs into the lungs every 4 (four) hours as needed for wheezing or shortness of breath. (Patient not taking: Reported on 02/22/2021) 1 Inhaler 1   HYDROcodone-acetaminophen (NORCO) 5-325 MG tablet Take 1 tablet by mouth every 4 (four) hours as needed for moderate pain. (Patient not taking: Reported on 02/22/2021) 30 tablet 0   ibuprofen (ADVIL) 600 MG tablet Take 600 mg by mouth every 6 (six) hours as needed for moderate pain. (Patient not taking: Reported on 02/22/2021)     omeprazole (PRILOSEC) 20 MG capsule Take 1 capsule (20 mg total) by mouth daily. 90 capsule 1   No current facility-administered medications for this visit.    ALLERGIES:  No Known Allergies  PHYSICAL EXAM:  Performance status (ECOG): 0 - Asymptomatic  Vitals:   02/22/21 0836  BP: 135/75  Resp: 16  Temp: 98.4 F (36.9 C)  SpO2: 99%   Wt Readings from Last 3 Encounters:  02/22/21 193 lb 11.2 oz (87.9 kg)  01/21/21 195 lb (88.5 kg)  08/09/20 193 lb 12.6 oz (87.9 kg)   Physical Exam Vitals reviewed.  Constitutional:      Appearance: Normal appearance.  Cardiovascular:     Rate and Rhythm: Normal rate and regular rhythm.     Heart sounds: Normal heart sounds.  Pulmonary:     Effort: Pulmonary effort is normal.     Breath sounds: Normal breath sounds.  Abdominal:     General: There is no distension.     Palpations: Abdomen is soft. There is no mass.  Neurological:     General: No focal deficit present.     Mental Status: She is alert and oriented to person, place, and time.  Psychiatric:        Mood and Affect: Mood normal.        Behavior: Behavior normal.     LABORATORY DATA:  I have reviewed the labs as  listed.  CBC Latest Ref Rng & Units 01/28/2021 01/21/2021 08/01/2020  WBC 4.0 - 10.5 K/uL 8.2 8.2 8.8  Hemoglobin 12.0 - 15.0 g/dL 85.8 11.8(L) 13.0  Hematocrit 36.0 - 46.0 % 39.8 36.4 39.8  Platelets 150 - 400 K/uL 352 292 318   CMP Latest Ref Rng & Units 01/28/2021 01/21/2021 08/01/2020  Glucose 70 - 99 mg/dL 99 86 95  BUN 6 - 20 mg/dL 15 11 14   Creatinine 0.44 - 1.00  mg/dL 7.42 5.95 6.38  Sodium 135 - 145 mmol/L 135 137 138  Potassium 3.5 - 5.1 mmol/L 4.0 3.4(L) 3.9  Chloride 98 - 111 mmol/L 105 106 106  CO2 22 - 32 mmol/L 24 24 23   Calcium 8.9 - 10.3 mg/dL 9.1 ) 9.1  Total Protein 6.5 - 8.1 g/dL 8.0 - 7.9  Total Bilirubin 0.3 - 1.2 mg/dL 0.5 - 0.5  Alkaline Phos 38 - 126 U/L 80 - 79  AST 15 - 41 U/L 23 - 20  ALT 0 - 44 U/L 26 - 22    DIAGNOSTIC IMAGING:  I have independently reviewed the scans and discussed with the patient. No results found.    ASSESSMENT:   1.  Mediastinal and right axillary adenopathy: -CT chest PE protocol on 07/12/2019 showed lymphadenopathy anterior to the carina, AP window, pretracheal, right hilar and several prominent subcarinal lymph nodes, largest measuring 2.4 cm.  Lung showed mosaic attenuation at several sites. -Evaluated by Dr. 07/14/2019 on the PET scan was done on 09/07/2019.  This showed 1.2 cm prevascular lymph node, right paratracheal lymph node, subcarinal lymph node, right hilar lymph node.  Right axilla lymph node measuring 0.9 cm short axis with SUV 5.8.  There is increased uptake in the distal esophagus, nonspecific.  1.7 cm peripancreatic lymph node with SUV max of 2.68. -Right axillary lymph node biopsy on 09/21/2019 showed limited lymphoid tissue.  Lymphoid tissue primarily composed of small round to slightly irregular lymphocytes mixed with variable number of plasma cells.  No well-formed granuloma or malignancy noted. -She smokes about 8 cigarettes/day for 25 years.  She reports brownish expectoration and small amounts for a long  time. -Right axillary lymph node biopsy on 11/16/2019 shows reactive lymphoid hyperplasia. -CT chest on 01/25/2020 showed persistent but stable appearance of mediastinal and right hilar adenopathy.  Subtle perifissural micronodularity is suspected.  Mild mosaic attenuation pattern which may be seen with small airway disease. Most recent CT chest with unchanged lymphadenopathy, previously biopsied with benign reactive histology.   PLAN:  1.  Mediastinal and right axillary adenopathy: -She does not report any B symptoms. -She most recently had another CT chest on 01/21/2021 which showed no evidence of PE, cardiac enlargement and questionable heart failure, no change in appearance of mediastinal and hilar adenopathy this has been present since 07/12/2019 and although etiology is indeterminate the relative stability favors a benign etiology. Have recommended that given the stability over the past 2 years and since she being completely asymptomatic along with a negative biopsy in the past, we can continue with follow-up once a year and repeat imaging in a year.  She was however encouraged to contact 07/14/2019 with any questions or concerns in the interim. 2.  She is not up-to-date with age-appropriate cancer screening, recommend that she stays up-to-date with mammogram, colonoscopy and Pap smear.  She was encouraged to contact her PCP for follow-up regarding her other health reasons.   Orders placed this encounter:  No orders of the defined types were placed in this encounter.   [place] MD

## 2021-02-25 ENCOUNTER — Other Ambulatory Visit (HOSPITAL_COMMUNITY): Payer: Self-pay

## 2021-02-25 DIAGNOSIS — R590 Localized enlarged lymph nodes: Secondary | ICD-10-CM

## 2021-02-25 DIAGNOSIS — R591 Generalized enlarged lymph nodes: Secondary | ICD-10-CM

## 2021-05-09 ENCOUNTER — Other Ambulatory Visit (HOSPITAL_COMMUNITY): Payer: Self-pay | Admitting: Family Medicine

## 2021-05-09 DIAGNOSIS — Z1231 Encounter for screening mammogram for malignant neoplasm of breast: Secondary | ICD-10-CM

## 2021-05-28 ENCOUNTER — Other Ambulatory Visit (HOSPITAL_COMMUNITY): Payer: Self-pay | Admitting: Family Medicine

## 2021-05-28 DIAGNOSIS — Z1231 Encounter for screening mammogram for malignant neoplasm of breast: Secondary | ICD-10-CM

## 2021-05-29 ENCOUNTER — Other Ambulatory Visit: Payer: Self-pay

## 2021-05-29 ENCOUNTER — Ambulatory Visit (HOSPITAL_COMMUNITY)
Admission: RE | Admit: 2021-05-29 | Discharge: 2021-05-29 | Disposition: A | Discharge status: Home / Self Care | Payer: Commercial Managed Care - PPO | Source: Ambulatory Visit | Attending: Family Medicine | Admitting: Family Medicine

## 2021-05-29 DIAGNOSIS — Z1231 Encounter for screening mammogram for malignant neoplasm of breast: Secondary | ICD-10-CM | POA: Insufficient documentation

## 2022-01-28 ENCOUNTER — Ambulatory Visit (HOSPITAL_COMMUNITY)
Admission: RE | Admit: 2022-01-28 | Discharge: 2022-01-28 | Disposition: A | Discharge status: Home / Self Care | Payer: Commercial Managed Care - PPO | Source: Ambulatory Visit | Attending: Hematology and Oncology | Admitting: Hematology and Oncology

## 2022-01-28 ENCOUNTER — Inpatient Hospital Stay (HOSPITAL_COMMUNITY): Payer: Commercial Managed Care - PPO | Attending: Hematology

## 2022-01-28 DIAGNOSIS — R591 Generalized enlarged lymph nodes: Secondary | ICD-10-CM

## 2022-01-28 DIAGNOSIS — R590 Localized enlarged lymph nodes: Secondary | ICD-10-CM | POA: Insufficient documentation

## 2022-01-28 DIAGNOSIS — Z87891 Personal history of nicotine dependence: Secondary | ICD-10-CM | POA: Insufficient documentation

## 2022-01-28 LAB — COMPREHENSIVE METABOLIC PANEL
ALT: 22 U/L (ref 0–44)
AST: 22 U/L (ref 15–41)
Albumin: 3.9 g/dL (ref 3.5–5.0)
Alkaline Phosphatase: 74 U/L (ref 38–126)
Anion gap: 7 (ref 5–15)
BUN: 18 mg/dL (ref 6–20)
CO2: 23 mmol/L (ref 22–32)
Calcium: 8.7 mg/dL — ABNORMAL LOW (ref 8.9–10.3)
Chloride: 107 mmol/L (ref 98–111)
Creatinine, Ser: 0.81 mg/dL (ref 0.44–1.00)
GFR, Estimated: >60 mL/min (ref >60)
Glucose, Bld: 157 mg/dL — ABNORMAL HIGH (ref 70–99)
Potassium: 3.6 mmol/L (ref 3.5–5.1)
Sodium: 137 mmol/L (ref 135–145)
Total Bilirubin: 0.7 mg/dL (ref 0.3–1.2)
Total Protein: 7.5 g/dL (ref 6.5–8.1)

## 2022-01-28 LAB — FERRITIN: Ferritin: 155 ng/mL (ref 11–307)

## 2022-01-28 LAB — IRON AND TIBC
Iron: 56 ug/dL (ref 28–170)
Saturation Ratios: 15 % (ref 10.4–31.8)
TIBC: 376 ug/dL (ref 250–450)
UIBC: 320 ug/dL

## 2022-01-28 LAB — CBC WITH DIFFERENTIAL/PLATELET
Abs Immature Granulocytes: 0.02 10*3/uL (ref 0.00–0.07)
Basophils Absolute: 0 10*3/uL (ref 0.0–0.1)
Basophils Relative: 1 %
Eosinophils Absolute: 0.1 10*3/uL (ref 0.0–0.5)
Eosinophils Relative: 1 %
HCT: 38.5 % (ref 36.0–46.0)
Hemoglobin: 12.8 g/dL (ref 12.0–15.0)
Immature Granulocytes: 0 %
Lymphocytes Relative: 24 %
Lymphs Abs: 1.9 10*3/uL (ref 0.7–4.0)
MCH: 31.6 pg (ref 26.0–34.0)
MCHC: 33.2 g/dL (ref 30.0–36.0)
MCV: 95.1 fL (ref 80.0–100.0)
Monocytes Absolute: 0.5 10*3/uL (ref 0.1–1.0)
Monocytes Relative: 6 %
Neutro Abs: 5.3 10*3/uL (ref 1.7–7.7)
Neutrophils Relative %: 68 %
Platelets: 272 10*3/uL (ref 150–400)
RBC: 4.05 MIL/uL (ref 3.87–5.11)
RDW: 14 % (ref 11.5–15.5)
WBC: 7.8 10*3/uL (ref 4.0–10.5)
nRBC: 0 % (ref 0.0–0.2)

## 2022-01-28 LAB — VITAMIN B12: Vitamin B-12: 793 pg/mL (ref 180–914)

## 2022-01-28 LAB — LACTATE DEHYDROGENASE: LDH: 204 U/L — ABNORMAL HIGH (ref 98–192)

## 2022-01-28 LAB — FOLATE: Folate: 10 ng/mL (ref >5.9)

## 2022-01-28 MED ORDER — IOHEXOL 300 MG/ML  SOLN
80.0000 mL | Freq: Once | INTRAMUSCULAR | Status: AC | PRN
  Administered 2022-01-28: 80 mL via INTRAVENOUS

## 2022-02-03 ENCOUNTER — Inpatient Hospital Stay (HOSPITAL_BASED_OUTPATIENT_CLINIC_OR_DEPARTMENT_OTHER): Payer: Commercial Managed Care - PPO | Admitting: Hematology

## 2022-02-03 VITALS — BP 128/75 | HR 91 | Temp 97.2°F | Resp 18 | Ht 62.0 in | Wt 196.4 lb

## 2022-02-03 DIAGNOSIS — R591 Generalized enlarged lymph nodes: Secondary | ICD-10-CM | POA: Diagnosis not present

## 2022-02-03 DIAGNOSIS — R590 Localized enlarged lymph nodes: Secondary | ICD-10-CM | POA: Diagnosis present

## 2022-02-03 DIAGNOSIS — Z87891 Personal history of nicotine dependence: Secondary | ICD-10-CM | POA: Diagnosis not present

## 2022-02-03 NOTE — Patient Instructions (Addendum)
[facility] at [facility] Discharge Instructions   You were seen and examined today by Dr. Ellin Saba.  He reviewed the results of your lab work and scan, which were normal/stable.   We will see you back in 1 year with repeat blood work.    Thank you for choosing [facility] at [facility] to provide your oncology and hematology care.  To afford each patient quality time with our provider, please arrive at least 15 minutes before your scheduled appointment time.   If you have a lab appointment with the Cancer Center please come in thru the Main Entrance and check in at the main information desk.  You need to re-schedule your appointment should you arrive 10 or more minutes late.  We strive to give you quality time with our providers, and arriving late affects you and other patients whose appointments are after yours.  Also, if you no show three or more times for appointments you may be dismissed from the clinic at the providers discretion.     Again, thank you for choosing [facility].  Our hope is that these requests will decrease the amount of time that you wait before being seen by our physicians.       _____________________________________________________________  Should you have questions after your visit to [facility], please contact our office at [phone] and follow the prompts.  Our office hours are 8:00 a.m. and 4:30 p.m. Monday - Friday.  Please note that voicemails left after 4:00 p.m. may not be returned until the following business day.  We are closed weekends and major holidays.  You do have access to a nurse 24-7, just call the main number to the clinic [phone] and do not press any options, hold on the line and a nurse will answer the phone.    For prescription refill requests, have your pharmacy contact our office and allow 72 hours.    Due to Covid, you will need to wear a mask upon entering the  hospital. If you do not have a mask, a mask will be given to you at the Main Entrance upon arrival. For doctor visits, patients may have 1 support person age 7 or older with them. For treatment visits, patients can not have anyone with them due to social distancing guidelines and our immunocompromised population.

## 2022-02-03 NOTE — Progress Notes (Signed)
[place], [place] [address]   CLINIC:  Medical Oncology/Hematology  PCP:  Center, [facility] [address] [place] [address] [phone]   REASON FOR VISIT:  Follow-up for mediastinal and right axillary adenopathy  PRIOR THERAPY: none  CURRENT THERAPY: surveillance  INTERVAL HISTORY:  [name], a 56 y.o. female, returns for routine follow-up of her mediastinal and right axillary adenopathy. Teasha was last seen on 01/30/2020.   Today she reports feeling good. She denies fevers, infections, and fevers. She denies family history of sarcoidosis. She quit smoking 5 months ago after smoking 1/2 ppd for 28 years. Her energy levels at baseline.   REVIEW OF SYSTEMS:  Review of Systems  Constitutional:  Negative for appetite change, fatigue and fever.  Respiratory:  Positive for shortness of breath (occasionally).   Neurological:  Positive for numbness.  Psychiatric/Behavioral:  Positive for sleep disturbance.   All other systems reviewed and are negative.   PAST MEDICAL/SURGICAL HISTORY:  Past Medical History:  Diagnosis Date   Acute respiratory failure with hypoxia ([place]) 10/26/2018   Arthritis    Difficult intubation    GERD (gastroesophageal reflux disease)    HLD (hyperlipidemia)    Hypertension    Influenza A 10/26/2018   Pneumonia 10/26/2018   Past Surgical History:  Procedure Laterality Date   AXILLARY LYMPH NODE BIOPSY Right 11/16/2019   Procedure: AXILLARY LYMPH NODE BIOPSY;  Surgeon: Aviva Signs, MD;  Location: [facility];  Service: General;  Laterality: Right;   FINGER SURGERY Right 1995   [place] HISTORY:  Social History   Socioeconomic History   Marital status: Married    Spouse name: Berneta Sages   Number of children: 1   Years of education: Not on file   Highest education level: Not on file  Occupational History   Occupation: CNA  Tobacco Use    Smoking status: Every Day    Packs/day: 0.50    Years: 30.00    Total pack years: 15.00    Types: Cigarettes   Smokeless tobacco: Never  Vaping Use   Vaping Use: Some days  Substance and Sexual Activity   Alcohol use: Yes    Comment: ocassional on the weekends   Drug use: No   Sexual activity: Not Currently  Other Topics Concern   Not on file  Social History Narrative   Not on file   Social Determinants of Health   Financial Resource Strain: Low Risk  (10/18/2019)   Overall Financial Resource Strain (CARDIA)    Difficulty of Paying Living Expenses: Not hard at all  Food Insecurity: No Food Insecurity (10/18/2019)   Hunger Vital Sign    Worried About Running Out of Food in the Last Year: Never true    Ran Out of Food in the Last Year: Never true  Transportation Needs: No Transportation Needs (10/18/2019)   PRAPARE - Hydrologist (Medical): No    Lack of Transportation (Non-Medical): No  Physical Activity: Inactive (10/18/2019)   Exercise Vital Sign    Days of Exercise per Week: 0 days    Minutes of Exercise per Session: 0 min  Stress: No Stress Concern Present (10/18/2019)   [place]    Feeling of Stress : Not at all  Social Connections: Moderately Integrated (10/18/2019)   Social Connection and Isolation Panel [  NHANES]    Frequency of Communication with Friends and Family: Three times a week    Frequency of Social Gatherings with Friends and Family: Three times a week    Attends Religious Services: More than 4 times per year    Active Member of Clubs or Organizations: No    Attends Banker Meetings: Never    Marital Status: Married  Catering manager Violence: Not At Risk (10/18/2019)   Humiliation, Afraid, Rape, and Kick questionnaire    Fear of Current or Ex-Partner: No    Emotionally Abused: No    Physically Abused: No    Sexually Abused: No    FAMILY HISTORY:   Family History  Problem Relation Age of Onset   Hypertension Mother    Alzheimer's disease Mother    Diabetes Mother    Heart disease Father    Hypertension Father    Hypertension Sister    Heart disease Sister    Heart disease Sister    Hypertension Sister    Colon cancer Neg Hx    Esophageal cancer Neg Hx    Stomach cancer Neg Hx    Colon polyps Neg Hx     CURRENT MEDICATIONS:  Current Outpatient Medications  Medication Sig Dispense Refill   albuterol (PROVENTIL HFA;VENTOLIN HFA) 108 (90 Base) MCG/ACT inhaler Inhale 2 puffs into the lungs every 4 (four) hours as needed for wheezing or shortness of breath. (Patient not taking: Reported on 02/22/2021) 1 Inhaler 1   amitriptyline (ELAVIL) 100 MG tablet TAKE 1 TABLET (100 MG TOTAL) BY MOUTH AT BEDTIME. (Patient taking differently: Take 200 mg by mouth at bedtime.) 30 tablet 0   amLODipine (NORVASC) 10 MG tablet Take 1 tablet (10 mg total) by mouth daily. 90 tablet 3   aspirin 81 MG tablet Take 81 mg by mouth daily.     cholecalciferol (VITAMIN D3) 25 MCG (1000 UNIT) tablet Take 2,000 Units by mouth daily.     gabapentin (NEURONTIN) 300 MG capsule Take 300 mg by mouth 3 (three) times daily.     HYDROcodone-acetaminophen (NORCO) 5-325 MG tablet Take 1 tablet by mouth every 4 (four) hours as needed for moderate pain. (Patient not taking: Reported on 02/22/2021) 30 tablet 0   ibuprofen (ADVIL) 600 MG tablet Take 600 mg by mouth every 6 (six) hours as needed for moderate pain. (Patient not taking: Reported on 02/22/2021)     metoprolol (LOPRESSOR) 50 MG tablet TAKE 1 TABLET BY MOUTH TWICE A DAY (Patient taking differently: Take 50 mg by mouth 2 (two) times daily.) 60 tablet 0   omeprazole (PRILOSEC) 20 MG capsule Take 1 capsule (20 mg total) by mouth daily. 90 capsule 1   pravastatin (PRAVACHOL) 80 MG tablet Take 80 mg by mouth daily.     No current facility-administered medications for this visit.    ALLERGIES:  No Known  Allergies  PHYSICAL EXAM:  Performance status (ECOG): 0 - Asymptomatic  There were no vitals filed for this visit. Wt Readings from Last 3 Encounters:  02/22/21 193 lb 11.2 oz (87.9 kg)  01/21/21 195 lb (88.5 kg)  08/09/20 193 lb 12.6 oz (87.9 kg)   Physical Exam Vitals reviewed.  Constitutional:      Appearance: Normal appearance. She is obese.  Cardiovascular:     Rate and Rhythm: Normal rate and regular rhythm.     Pulses: Normal pulses.     Heart sounds: Normal heart sounds.  Pulmonary:     Effort: Pulmonary effort is  normal.     Breath sounds: Normal breath sounds.  Abdominal:     Palpations: Abdomen is soft. There is no hepatomegaly, splenomegaly or mass.     Tenderness: There is no abdominal tenderness.  Lymphadenopathy:     Upper Body:     Right upper body: No supraclavicular or axillary adenopathy.     Left upper body: No supraclavicular or axillary adenopathy.     Lower Body: No right inguinal adenopathy. No left inguinal adenopathy.  Neurological:     General: No focal deficit present.     Mental Status: She is alert and oriented to person, place, and time.  Psychiatric:        Mood and Affect: Mood normal.        Behavior: Behavior normal.      LABORATORY DATA:  I have reviewed the labs as listed.     Latest Ref Rng & Units 01/28/2022    9:06 AM 01/28/2021    8:56 AM 01/21/2021   10:27 AM  CBC  WBC 4.0 - 10.5 K/uL 7.8  8.2  8.2   Hemoglobin 12.0 - 15.0 g/dL 65.6  81.2  75.1   Hematocrit 36.0 - 46.0 % 38.5  39.8  36.4   Platelets 150 - 400 K/uL 272  352  292       Latest Ref Rng & Units 01/28/2022    9:06 AM 01/28/2021    8:56 AM 01/21/2021   10:27 AM  CMP  Glucose 70 - 99 mg/dL 700  99  86   BUN 6 - 20 mg/dL 18  15  11    Creatinine 0.44 - 1.00 mg/dL  1.74  9.44   Sodium 135 - 145 mmol/L 137  135  137   Potassium 3.5 - 5.1 mmol/L 3.6  4.0  3.4   Chloride 98 - 111 mmol/L 107  105  106   CO2 22 - 32 mmol/L 23  24  24    Calcium 8.9 - 10.3 mg/dL  8.7  9.1  8.7   Total Protein 6.5 - 8.1 g/dL 7.5  8.0    Total Bilirubin 0.3 - 1.2 mg/dL 0.7  0.5    Alkaline Phos 38 - 126 U/L 74  80    AST 15 - 41 U/L 22  23    ALT 0 - 44 U/L 22  26      DIAGNOSTIC IMAGING:  I have independently reviewed the scans and discussed with the patient. CT Chest W Contrast  Result Date: 01/28/2022 CLINICAL DATA:  Mediastinal and hilar lymphadenopathy.  Follow-up. EXAM: CT CHEST WITH CONTRAST TECHNIQUE: Multidetector CT imaging of the chest was performed during intravenous contrast administration. RADIATION DOSE REDUCTION: This exam was performed according to the departmental dose-optimization program which includes automated exposure control, adjustment of the mA and/or kV according to patient size and/or use of iterative reconstruction technique. CONTRAST:  52mL OMNIPAQUE IOHEXOL 300 MG/ML  SOLN COMPARISON:  CT angiogram chest 01/21/2021. FINDINGS: Cardiovascular: No significant vascular findings. Normal heart size. No pericardial effusion. There are atherosclerotic calcifications of the coronary arteries. Mediastinum/Nodes: There are diffusely enlarged paratracheal lymph nodes which have mildly decreased in size now measuring up to 12 mm (previously 14 mm). Enlarged precarinal lymph node measures 15 mm short axis, unchanged. Subcarinal lymphadenopathy present with lymph node measuring up to 13 mm short axis (previously 16). Prevascular lymph node measures 11 mm short axis (previously 13). Enlarged right hilar lymph node measures 12 mm short axis, unchanged. There  are prominent left hilar lymph nodes, also unchanged. There are nonenlarged bilateral axillary lymph nodes similar to prior study. Visualized thyroid gland and esophagus are within normal limits. Lungs/Pleura: There are minimal patchy ground-glass opacities in both lower lobes. The lungs are otherwise clear. No pleural effusion or pneumothorax. Trachea and central airways are patent. Upper Abdomen: No acute  abnormality. Musculoskeletal: No chest wall abnormality. No acute or significant osseous findings. IMPRESSION: 1. Diffuse mediastinal and hilar lymphadenopathy is present, but has mildly decreased in size. 2. Minimal ground-glass opacities in the lung bases persist and may represent mild infection or edema. The lungs are otherwise clear. Electronically Signed   By: Ronney Asters M.D.   On: 01/28/2022 20:50     ASSESSMENT:  1.  Mediastinal and right axillary adenopathy: -CT chest PE protocol on 07/12/2019 showed lymphadenopathy anterior to the carina, AP window, pretracheal, right hilar and several prominent subcarinal lymph nodes, largest measuring 2.4 cm.  Lung showed mosaic attenuation at several sites. -Evaluated by Dr. Vaughan Browner on the PET scan was done on 09/07/2019.  This showed 1.2 cm prevascular lymph node, right paratracheal lymph node, subcarinal lymph node, right hilar lymph node.  Right axilla lymph node measuring 0.9 cm short axis with SUV 5.8.  There is increased uptake in the distal esophagus, nonspecific.  1.7 cm peripancreatic lymph node with SUV max of 2.68. -Right axillary lymph node biopsy on 09/21/2019 showed limited lymphoid tissue.  Lymphoid tissue primarily composed of small round to slightly irregular lymphocytes mixed with variable number of plasma cells.  No well-formed granuloma or malignancy noted. -She smokes about 10 cigarettes/day for 28 years.  Quit smoking in January 2023. -Right axillary lymph node biopsy on 11/16/2019 shows reactive lymphoid hyperplasia. -CT chest on 01/25/2020 showed persistent but stable appearance of mediastinal and right hilar adenopathy.  Subtle perifissural micronodularity is suspected.  Mild mosaic attenuation pattern which may be seen with small airway disease.    PLAN:  1.  Mediastinal and right axillary adenopathy: - She does not have any B symptoms or infections. - She quit smoking 5 months ago, smoked half pack per day for 28 years. - No  palpable adenopathy on physical exam.  Labs reviewed showed normal LFTs, CBC.  LDH is minimally elevated and stable. - Reviewed CT chest from 01/28/2022 which showed multiple mediastinal lymph nodes, largest measuring 1.5 cm in short axis.  Overall they have decreased in size compared to prior scan from June 2022.  Axillary lymphadenopathy has resolved. -.  RTC 1 year for follow-up with labs and physical exam.  Will consider repeating imaging only if she develops B symptoms or abnormalities on the labs.   Orders placed this encounter:  No orders of the defined types were placed in this encounter.    Derek Jack, MD [place] [phone]   I, Thana Ates, am acting as a scribe for Dr. Derek Jack.  I, Derek Jack MD, have reviewed the above documentation for accuracy and completeness, and I agree with the above.

## 2022-04-07 ENCOUNTER — Ambulatory Visit: Payer: Commercial Managed Care - PPO | Admitting: Podiatry

## 2022-04-07 ENCOUNTER — Encounter: Payer: Self-pay | Admitting: Podiatry

## 2022-04-07 ENCOUNTER — Ambulatory Visit (INDEPENDENT_AMBULATORY_CARE_PROVIDER_SITE_OTHER): Payer: Commercial Managed Care - PPO

## 2022-04-07 DIAGNOSIS — M7661 Achilles tendinitis, right leg: Secondary | ICD-10-CM | POA: Diagnosis not present

## 2022-04-07 DIAGNOSIS — M25571 Pain in right ankle and joints of right foot: Secondary | ICD-10-CM

## 2022-04-07 MED ORDER — TRIAMCINOLONE ACETONIDE 10 MG/ML IJ SUSP
10.0000 mg | Freq: Once | INTRAMUSCULAR | Status: AC
  Administered 2022-04-07: 10 mg

## 2022-04-07 MED ORDER — DICLOFENAC SODIUM 75 MG PO TBEC: 75.0000 mg | DELAYED_RELEASE_TABLET | Freq: Two times a day (BID) | ORAL | 2 refills | Status: DC

## 2022-04-07 NOTE — Progress Notes (Signed)
Subjective:   Patient ID: Donna Fitzgerald, female   DOB: 56 y.o.   MRN: [id number]   HPI Patient points to the back of the right heel states its been very sore and making it hard to wear shoe gear comfortably and does not remember injury.  States it is gotten worse gradually over the last month and is very painful and she does have mild obesity does smoke half a pack of cigarettes per day tries to be active   Review of Systems  All other systems reviewed and are negative.       Objective:  Physical Exam Vitals and nursing note reviewed.  Constitutional:      Appearance: She is well-developed.  Pulmonary:     Effort: Pulmonary effort is normal.  Musculoskeletal:        General: Normal range of motion.  Skin:    General: Skin is warm.  Neurological:     Mental Status: She is alert.     Neurovascular status found to be intact muscle strength was adequate with exquisite discomfort in the posterior medial aspect of the right heel at the insertional point of the tendon into the Achilles.  There is no indications of tendon dysfunction or other pathology of the tendon is functioning in a strong sense but it is very painful at its insertion.  Good digital perfusion well oriented     Assessment:  Acute Achilles tendinitis right medial side mild discomfort on the lateral side also     Plan:  H&P reviewed condition and x-ray.  At this point I did a careful injection of the medial side of the Achilles tendon insertion after explaining chances for rupture associated with doing this understands this wants procedure and gave me permission and this was done with no pathology on the medial side and I then applied air fracture walker for complete immobilization with it being fitted properly to her lower leg with all instructions given and I want her to try to reduce activity for the next several weeks.  Placed on oral diclofenac reappoint in 3 weeks or earlier if needed  X-rays indicate that  there is no indications of ankle pathology did not see significant spur formation

## 2022-04-07 NOTE — Patient Instructions (Signed)

## 2022-04-28 ENCOUNTER — Ambulatory Visit: Payer: Commercial Managed Care - PPO | Admitting: Podiatry

## 2022-04-28 ENCOUNTER — Encounter: Payer: Self-pay | Admitting: Podiatry

## 2022-04-28 DIAGNOSIS — M25571 Pain in right ankle and joints of right foot: Secondary | ICD-10-CM | POA: Diagnosis not present

## 2022-04-28 DIAGNOSIS — M7661 Achilles tendinitis, right leg: Secondary | ICD-10-CM

## 2022-04-28 NOTE — Progress Notes (Signed)
Subjective:   Patient ID: Donna Fitzgerald, female   DOB: 56 y.o.   MRN: [id number]   HPI Patient states that she is improving but still is having some ankle pain right   ROS      Objective:  Physical Exam  Neuro vascular status intact significant diminishment of discomfort in the posterior medial aspect of the right heel with mild hip pain probably from either boot usage or walking differently because of the Achilles     Assessment:  Achilles tendinitis right improved     Plan:  Reviewed condition recommended the continuation of ice therapy as needed continued boot usage reappoint for [place] to recheck again and may require more aggressive treatment if symptoms were to recur or persist

## 2022-04-30 ENCOUNTER — Other Ambulatory Visit (HOSPITAL_COMMUNITY): Payer: Self-pay | Admitting: Family Medicine

## 2022-04-30 DIAGNOSIS — Z1231 Encounter for screening mammogram for malignant neoplasm of breast: Secondary | ICD-10-CM

## 2022-06-02 ENCOUNTER — Ambulatory Visit (HOSPITAL_COMMUNITY)
Admission: RE | Admit: 2022-06-02 | Discharge: 2022-06-02 | Disposition: A | Discharge status: Home / Self Care | Payer: Commercial Managed Care - PPO | Source: Ambulatory Visit | Attending: Family Medicine | Admitting: Family Medicine

## 2022-06-02 DIAGNOSIS — Z1231 Encounter for screening mammogram for malignant neoplasm of breast: Secondary | ICD-10-CM | POA: Insufficient documentation

## 2022-07-04 ENCOUNTER — Other Ambulatory Visit: Payer: Self-pay | Admitting: Podiatry

## 2022-08-04 ENCOUNTER — Encounter: Payer: Self-pay | Admitting: Podiatry

## 2022-08-04 ENCOUNTER — Ambulatory Visit: Payer: Commercial Managed Care - PPO | Admitting: Podiatry

## 2022-08-04 DIAGNOSIS — M7661 Achilles tendinitis, right leg: Secondary | ICD-10-CM

## 2022-08-04 MED ORDER — DEXAMETHASONE SODIUM PHOSPHATE 120 MG/30ML IJ SOLN
2.0000 mg | Freq: Once | INTRAMUSCULAR | Status: AC
  Administered 2022-08-04: 2 mg via INTRA_ARTICULAR

## 2022-08-04 MED ORDER — METHYLPREDNISOLONE 4 MG PO TBPK: ORAL_TABLET | ORAL | 0 refills | Status: DC

## 2022-08-04 NOTE — Progress Notes (Signed)
She presents today for follow-up of her right heel pain.  She states that she is getting another flareup where it had calm down does not recall any trauma did state that she had been wearing her boot while at home but is unable to wear to work.  Objective: Vital signs are stable alert and oriented x 3.  Pulses are palpable.  She has pain on palpation of the distal aspect of the Achilles at the watershed area and as it inserts on the posterior superior aspect of the calcaneus.  There is an area of fluctuance in this region and it is warm to the touch.  She has no palpable interruptions along the margins of the Achilles.  Assessment insertional Achilles tendinitis bursitis.  Plan: Discussed etiology pathology conservative versus surgical therapies.  Started her on methylprednisolone.  I injected 2 mg of dexamethasone anterior to the Achilles.

## 2022-08-19 ENCOUNTER — Telehealth: Payer: Self-pay | Admitting: Podiatry

## 2022-08-19 DIAGNOSIS — S86011A Strain of right Achilles tendon, initial encounter: Secondary | ICD-10-CM

## 2022-08-19 NOTE — Telephone Encounter (Signed)
Pt seen Dr Milinda Pointer on 08/04/22 and was told that if her foot wasn't getting better that he would recommend a MRI. Pt would like to get this done.   Please advise.

## 2022-08-21 NOTE — Addendum Note (Signed)
Addended by: Clovis Riley E on: 08/21/2022 02:40 PM   Modules accepted: Orders

## 2022-08-21 NOTE — Telephone Encounter (Signed)
Order placed for MRI. She should hear from the facility soon about scheduling.

## 2022-08-22 ENCOUNTER — Telehealth: Payer: Self-pay | Admitting: Podiatry

## 2022-08-22 NOTE — Telephone Encounter (Signed)
Pt was calling asking for a referral for MRI and I informed pt it was already sent out yesterday and to expect a call soon with them for scheduling appt.

## 2022-08-25 NOTE — Telephone Encounter (Signed)
Patient has been updated thru voice message

## 2022-09-10 ENCOUNTER — Encounter: Payer: Self-pay | Admitting: Podiatry

## 2022-09-12 ENCOUNTER — Ambulatory Visit
Admission: RE | Admit: 2022-09-12 | Discharge: 2022-09-12 | Disposition: A | Discharge status: Home / Self Care | Payer: Commercial Managed Care - PPO | Source: Ambulatory Visit | Attending: Podiatry | Admitting: Podiatry

## 2022-09-12 DIAGNOSIS — S86011A Strain of right Achilles tendon, initial encounter: Secondary | ICD-10-CM

## 2022-09-15 ENCOUNTER — Telehealth: Payer: Self-pay | Admitting: Podiatry

## 2022-09-15 NOTE — Telephone Encounter (Signed)
Patient called LM on after hours line asking if we received her MRI results back.

## 2022-09-24 ENCOUNTER — Ambulatory Visit: Payer: Commercial Managed Care - PPO | Admitting: Podiatry

## 2022-09-24 ENCOUNTER — Encounter: Payer: Self-pay | Admitting: *Deleted

## 2022-09-24 ENCOUNTER — Encounter: Payer: Self-pay | Admitting: Podiatry

## 2022-09-24 VITALS — BP 147/78 | HR 121

## 2022-09-24 DIAGNOSIS — S86011D Strain of right Achilles tendon, subsequent encounter: Secondary | ICD-10-CM | POA: Diagnosis not present

## 2022-09-24 NOTE — Progress Notes (Signed)
She presents today for follow-up of her MRI.  States that my right foot is just getting worse and worse as time goes on.  Objective: Vital signs stable alert oriented x 3 the foot is still exquisitely painful to palpation of the Achilles at its insertion site on the posterior heel with areas of warmth.  Plantarflexion is painful against resistance.  MRI does demonstrate 2 transverse tears very small in nature with 1 linear tear of 1.5 cm.  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  Plan: Discussed etiology pathology conservative surgical therapies at this point were going to go ahead and schedule her for a gastroc recession and a Achilles tenolysis with a retrocalcaneal heel spur resection as well as cast and PRP injection.  We discussed pros and cons of the surgery she understands and is amenable to it we did discuss possible postop complications which may include but not limited to postop pain bleeding swelling infection recurrence need for further surgery overcorrection under correction also digit loss limb loss of life.  We will have this performed at [facility] specialty surgical center.  She will receive a sciatic block and general anesthesia.  Provided her with information regarding the surgery center and I will follow-up with her in the near future.  She understands that she will be nonweightbearing and will need crutches or a knee scooter.

## 2022-09-25 ENCOUNTER — Encounter: Payer: Self-pay | Admitting: Podiatry

## 2022-09-26 ENCOUNTER — Telehealth: Payer: Self-pay | Admitting: Urology

## 2022-09-26 NOTE — Telephone Encounter (Signed)
DOS - 10/10/22  ACHILLES TENOLYSIS RIGHT --- [id number] GASTROCNEMIUS RECESS RIGHT --- [id number] CALCANEAL OSTECTOMY RIGHT --- [id number] INJECTION RIGHT --- 20550  [facility] EFFECTIVE DATE - 05/18/22  PLAN DEDUCTIBLE - $1,500.00 W/ $950.67 to go OUT OF POCKET - $5,000.00 W/ $4,272.87 to go  COINSURANCE - 30% COPAY - $0.00   SPOKE WITH KAL WITH [facility] AND HE STATED THAT FOR CPT CODES 28413, [id number], [id number] AND 24401 NO PRIOR AUTH IS REQUIRED.  REF # [id number]

## 2022-10-15 ENCOUNTER — Other Ambulatory Visit: Payer: Self-pay | Admitting: Podiatry

## 2022-10-15 ENCOUNTER — Encounter: Payer: Commercial Managed Care - PPO | Admitting: Podiatry

## 2022-10-15 MED ORDER — ONDANSETRON HCL 4 MG PO TABS: 4.0000 mg | ORAL_TABLET | Freq: Three times a day (TID) | ORAL | 0 refills | Status: AC | PRN

## 2022-10-15 MED ORDER — CEPHALEXIN 500 MG PO CAPS: 500.0000 mg | ORAL_CAPSULE | Freq: Three times a day (TID) | ORAL | 0 refills | Status: DC

## 2022-10-15 MED ORDER — OXYCODONE-ACETAMINOPHEN 10-325 MG PO TABS: 1.0000 | ORAL_TABLET | Freq: Three times a day (TID) | ORAL | 0 refills | Status: AC | PRN

## 2022-10-17 DIAGNOSIS — M216X1 Other acquired deformities of right foot: Secondary | ICD-10-CM | POA: Diagnosis not present

## 2022-10-17 DIAGNOSIS — M7661 Achilles tendinitis, right leg: Secondary | ICD-10-CM | POA: Diagnosis not present

## 2022-10-17 DIAGNOSIS — S86011D Strain of right Achilles tendon, subsequent encounter: Secondary | ICD-10-CM | POA: Diagnosis not present

## 2022-10-22 ENCOUNTER — Other Ambulatory Visit: Payer: Self-pay | Admitting: Podiatry

## 2022-10-22 ENCOUNTER — Ambulatory Visit (INDEPENDENT_AMBULATORY_CARE_PROVIDER_SITE_OTHER): Payer: Commercial Managed Care - PPO | Admitting: Podiatry

## 2022-10-22 ENCOUNTER — Ambulatory Visit (INDEPENDENT_AMBULATORY_CARE_PROVIDER_SITE_OTHER): Payer: Commercial Managed Care - PPO

## 2022-10-22 ENCOUNTER — Encounter: Payer: Self-pay | Admitting: Podiatry

## 2022-10-22 ENCOUNTER — Encounter: Payer: Commercial Managed Care - PPO | Admitting: Podiatry

## 2022-10-22 VITALS — BP 130/83 | HR 88 | Temp 97.9°F

## 2022-10-22 DIAGNOSIS — Z9889 Other specified postprocedural states: Secondary | ICD-10-CM

## 2022-10-22 DIAGNOSIS — S86011D Strain of right Achilles tendon, subsequent encounter: Secondary | ICD-10-CM

## 2022-10-22 NOTE — Progress Notes (Signed)
She presents today for her first postop visit date of surgery 10/17/2022 she is status post excision heel spur retrocalcaneal heel spur resection with Achilles tenolysis and gastroc recession and cast.  She denies fever chills nausea vomit muscle aches pains states that she does take her pain medication regularly.  Had a small itch with that.  She continues to take her daily baby aspirin.  Objective: Vital signs are stable alert oriented x 3.  Cast is intact slightly dirty to the plantar distal aspect of the cast where she has been putting her toe down.  Cast is loose proximally stenotic distally.  Does not piston on the leg.  She has exposed toes which are of normal sensation and normal range of motion.  They are normal color and warmth.  Radiographs taken today demonstrate osseously mature individual good mineralization complete resection of the heel spur posteriorly.  Staples are intact proximally and distally.    Assessment: Well-healing surgical foot in the leg.  Plan: Follow-up with her in 1 week.  This will be her first cast change.

## 2022-10-29 ENCOUNTER — Encounter: Payer: Self-pay | Admitting: Podiatry

## 2022-10-29 ENCOUNTER — Ambulatory Visit (INDEPENDENT_AMBULATORY_CARE_PROVIDER_SITE_OTHER): Payer: Commercial Managed Care - PPO | Admitting: Podiatry

## 2022-10-29 DIAGNOSIS — S86011D Strain of right Achilles tendon, subsequent encounter: Secondary | ICD-10-CM

## 2022-10-29 DIAGNOSIS — Z9889 Other specified postprocedural states: Secondary | ICD-10-CM

## 2022-10-29 NOTE — Progress Notes (Signed)
Donna Fitzgerald presents with her husband today date of surgery 10/17/2022 retrocalcaneal heel spur resection and Achilles tenolysis gastroc recession with PRP injection and cast application.  She denies fever chills nausea vomit muscle aches and pains states that the heel is little bit sore.  Objective: Vital signs are stable she is alert oriented x 3 presents with her husband today in a knee scooter with cast intact dry and clean.  Once removed demonstrates dry sterile dressing intact staples are intact margins well coapted minimal edema no erythema cellulitis drainage or odor.  Assessment: Well-healing surgical foot.  Plan: Redressed today dressed a compressive dressing reapplied cast and will follow-up with her in 2 weeks for cast removal staple removal and application of cam boot.  She will remain nonweightbearing for at least another 2 weeks before partial weightbearing starts.

## 2022-11-05 ENCOUNTER — Encounter: Payer: Commercial Managed Care - PPO | Admitting: Podiatry

## 2022-11-12 ENCOUNTER — Ambulatory Visit (INDEPENDENT_AMBULATORY_CARE_PROVIDER_SITE_OTHER): Payer: Commercial Managed Care - PPO | Admitting: Podiatry

## 2022-11-12 DIAGNOSIS — S86011D Strain of right Achilles tendon, subsequent encounter: Secondary | ICD-10-CM

## 2022-11-12 DIAGNOSIS — Z9889 Other specified postprocedural states: Secondary | ICD-10-CM

## 2022-11-12 NOTE — Progress Notes (Signed)
  Subjective:  Patient ID: Donna Fitzgerald, female    DOB: 06/24/1966,  MRN: [id number]  Chief Complaint  Patient presents with   Routine Post Op    POV #3 DOS 10/17/2022 RT FOOT EXCISION HEEL SPUR, ACHILLES TENOLYSIS, GASTROC RECESSION, PRP INJECTION, CAST APPLICATION/DR HYATT PT     57 y.o. female returns for post-op check.  She is doing well she has been in the cast using her knee scooter.  Pain is controlled.  Review of Systems: Negative except as noted in the HPI. Denies N/V/F/Ch.   Objective:  There were no vitals filed for this visit. There is no height or weight on file to calculate BMI. Constitutional Well developed. Well nourished.  Vascular Foot warm and well perfused. Capillary refill normal to all digits.  Calf is soft and supple, no posterior calf or knee pain, negative Homans' sign  Neurologic Normal speech. Oriented to person, place, and time. Epicritic sensation to light touch grossly present bilaterally.  Dermatologic Skin healing well without signs of infection. Skin edges well coapted without signs of infection.  Orthopedic: Tenderness to palpation noted about the surgical site.    Assessment:   1. Rupture of right Achilles tendon, subsequent encounter   2. Status post right foot surgery    Plan:  Patient was evaluated and treated and all questions answered.  S/p foot surgery right -Progressing as expected post-operatively.  Staples removed uneventfully.  She may begin bathing and washing the foot no soaking or scrubbing.  Continue nonweightbearing.  May apply lotion or Neosporin to incisions.  Compressive sleeve applied and she will continue to utilize the CAM boot that she has an knee scooter.  Return in 2 weeks for follow-up.    No follow-ups on file.

## 2022-11-26 ENCOUNTER — Encounter: Payer: Commercial Managed Care - PPO | Admitting: Podiatry

## 2022-11-26 ENCOUNTER — Encounter: Payer: Self-pay | Admitting: Podiatry

## 2022-11-26 ENCOUNTER — Ambulatory Visit (INDEPENDENT_AMBULATORY_CARE_PROVIDER_SITE_OTHER): Payer: Commercial Managed Care - PPO | Admitting: Podiatry

## 2022-11-26 DIAGNOSIS — S86011D Strain of right Achilles tendon, subsequent encounter: Secondary | ICD-10-CM

## 2022-11-26 DIAGNOSIS — Z9889 Other specified postprocedural states: Secondary | ICD-10-CM

## 2022-11-26 NOTE — Progress Notes (Signed)
Donna Fitzgerald presents today for a postop visit date of surgery 10/17/2022 she is now 6 weeks status post excision heel spur and Achilles tenolysis with gastroc recession.  She states that it feels pretty good and is getting tired of sitting she states that occasionally hurts particularly with pressure on her bed.  She presents today utilizing her knee scooter in her cam boot.  Objective: Vital signs stable oriented x 3 much decrease in edema no erythema cellulitis drainage odor he has some tenderness on palpation of the Achilles but very good plantarflexion 5/5 against significant resistance.  Assessment: Well-healing surgical foot right.  Plan: At this point I will allow her to start walking with her cam boot.  I will follow-up with her in 2 weeks at which time we will transition to a Tri-Lock brace and move into our tennis shoes.

## 2022-12-10 ENCOUNTER — Encounter: Payer: Commercial Managed Care - PPO | Admitting: Podiatry

## 2022-12-10 ENCOUNTER — Encounter: Payer: Self-pay | Admitting: Podiatrist

## 2022-12-10 ENCOUNTER — Encounter: Payer: Self-pay | Admitting: Podiatry

## 2022-12-10 ENCOUNTER — Ambulatory Visit (INDEPENDENT_AMBULATORY_CARE_PROVIDER_SITE_OTHER): Payer: Commercial Managed Care - PPO | Admitting: Podiatrist

## 2022-12-10 DIAGNOSIS — S86011D Strain of right Achilles tendon, subsequent encounter: Secondary | ICD-10-CM

## 2022-12-10 DIAGNOSIS — Z9889 Other specified postprocedural states: Secondary | ICD-10-CM

## 2022-12-10 NOTE — Progress Notes (Signed)
Chief Complaint  Patient presents with   Routine Post Op    "It's doing good."     HPI: Patient is 57 y.o. female who presents today for her post op appointment status post achilles tendon repair on  10/17/22. She has been wearing the boot as instructed and relates she is overall doing well.  She has not returned to work where she works at a [employer] facility.  She is scheduled to return next week.    No Known Allergies  Review of systems is negative except as noted in the HPI.  Denies nausea/ vomiting/ fevers/ chills or night sweats.   Denies difficulty breathing, denies calf pain or tenderness  Physical Exam  Patient is awake, alert, and oriented x 3.  In no acute distress.    Vascular status is intact with palpable pedal pulses DP and PT right  and capillary refill time less than 3 seconds right.  No edema or erythema noted.   Neurological exam reveals epicritic and protective sensation grossly intact right .   Dermatological exam reveals well healing incision-  no redness, no swelling noted.  Overall excellent appearance of the foot.   Musculoskeletal exam: Musculature intact with dorsiflexion, plantarflexion, inversion, eversion. Ankle range of motion is excellent with no pain along the posterior heel or achilles noted.     Assessment:   ICD-10-CM   1. Status post right foot surgery  Z98.890     2. Rupture of right Achilles tendon, subsequent encounter  S86.011D        Plan: Recommended weaning out of the the boot into a trilock ankle brace over the next 2 weeks.  I will push back her return to work for 2 more weeks so she may be fully out of the boot and in a sneaker and ankle brace.  Forms to extend her time out of work were filled out and faxed in.  She will Return to see Dr. Al Corpus in 4 weeks follow up and will call sooner if any problems arise.

## 2023-01-14 ENCOUNTER — Encounter: Payer: Self-pay | Admitting: Podiatry

## 2023-01-14 ENCOUNTER — Ambulatory Visit (INDEPENDENT_AMBULATORY_CARE_PROVIDER_SITE_OTHER): Payer: Commercial Managed Care - PPO | Admitting: Podiatry

## 2023-01-14 DIAGNOSIS — Z9889 Other specified postprocedural states: Secondary | ICD-10-CM

## 2023-01-14 DIAGNOSIS — S86011D Strain of right Achilles tendon, subsequent encounter: Secondary | ICD-10-CM

## 2023-01-14 NOTE — Progress Notes (Signed)
She presents today date of surgery 10/17/2022 for her right Achilles repair and heel spur resection with gastroc recession.  She is doing very well she states that she gets some pain and some swelling and some shooting pain particularly into the day after she has been on it for a while and puts her foot up to rest.  Objective: Vital signs stable alert oriented x 3 she has 5 out of 5 plantarflexion.  No erythema cellulitis drainage or odor..  Assessment: Healed surgical foot.  Plan: Follow-up with her in 1 month

## 2023-01-24 ENCOUNTER — Emergency Department (HOSPITAL_COMMUNITY)
Admission: EM | Admit: 2023-01-24 | Discharge: 2023-01-24 | Disposition: A | Discharge status: Home / Self Care | Payer: Commercial Managed Care - PPO | Attending: Emergency Medicine | Admitting: Emergency Medicine

## 2023-01-24 ENCOUNTER — Other Ambulatory Visit: Payer: Self-pay

## 2023-01-24 ENCOUNTER — Emergency Department (HOSPITAL_COMMUNITY): Payer: Commercial Managed Care - PPO

## 2023-01-24 ENCOUNTER — Encounter (HOSPITAL_COMMUNITY): Payer: Self-pay | Admitting: *Deleted

## 2023-01-24 DIAGNOSIS — S39012A Strain of muscle, fascia and tendon of lower back, initial encounter: Secondary | ICD-10-CM

## 2023-01-24 DIAGNOSIS — Y92410 Unspecified street and highway as the place of occurrence of the external cause: Secondary | ICD-10-CM | POA: Diagnosis not present

## 2023-01-24 DIAGNOSIS — M25512 Pain in left shoulder: Secondary | ICD-10-CM | POA: Diagnosis not present

## 2023-01-24 DIAGNOSIS — S161XXA Strain of muscle, fascia and tendon at neck level, initial encounter: Secondary | ICD-10-CM | POA: Diagnosis not present

## 2023-01-24 DIAGNOSIS — S3992XA Unspecified injury of lower back, initial encounter: Secondary | ICD-10-CM | POA: Diagnosis present

## 2023-01-24 DIAGNOSIS — Z7982 Long term (current) use of aspirin: Secondary | ICD-10-CM | POA: Insufficient documentation

## 2023-01-24 DIAGNOSIS — R0789 Other chest pain: Secondary | ICD-10-CM | POA: Diagnosis not present

## 2023-01-24 MED ORDER — NAPROXEN 250 MG PO TABS
500.0000 mg | ORAL_TABLET | Freq: Once | ORAL | Status: AC
  Administered 2023-01-24: 500 mg via ORAL
  Filled 2023-01-24: qty 2

## 2023-01-24 MED ORDER — NAPROXEN 500 MG PO TABS: 500.0000 mg | ORAL_TABLET | Freq: Two times a day (BID) | ORAL | 0 refills | Status: AC

## 2023-01-24 NOTE — Discharge Instructions (Signed)
Your testing shows no signs of broken bones of either your chest your ribs or your lower back, this was expected as most of the time this is just related to strained muscles and ligaments.  Please read the attached instructions, see your doctor in follow-up this week,  Please take Naprosyn, 500mg  by mouth twice daily as needed for pain - this in an antiinflammatory medicine (NSAID) and is similar to ibuprofen - many people feel that it is stronger than ibuprofen and it is easier to take since it is a smaller pill.  Please use this only for 1 week - if your pain persists, you will need to follow up with your doctor in the office for ongoing guidance and pain control.  Thank you for allowing [place] to treat you in the emergency department today.  After reviewing your examination and potential testing that was done it appears that you are safe to go home.  I would like for you to follow-up with your doctor within the next several days, have them obtain your results and follow-up with them to review all of these tests.  If you should develop severe or worsening symptoms return to the emergency department immediately
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
## 2023-02-01 NOTE — Progress Notes (Unsigned)
[facility] [address], [place] [address]   CLINIC:  Medical Oncology/Hematology  PCP:  Lorn Junes, FNP [address] [place] [address] [phone]   REASON FOR VISIT:  Follow-up for mediastinal and right axillary adenopathy  PRIOR THERAPY: None  CURRENT THERAPY: Surveillance  INTERVAL HISTORY:   Donna Fitzgerald 57 y.o. female returns for routine follow-up of her mediastinal and right axillary adenopathy.  She was last seen by Dr. Ellin Saba on 02/03/2022.  At today's visit, she reports feeling fair.  She quit smoking in January 2023 and has continued to abstain from cigarettes.  She denies any recent infections or antibiotics.  She denies any B symptoms such as fever, chills, night sweats, unintentional weight loss.  She has not noticed any new masses or lymphadenopathy.  She has 50% energy and 100% appetite. She endorses that she is maintaining a stable weight.  ASSESSMENT & PLAN:  1.  Mediastinal and right axillary adenopathy: - CT chest PE protocol on 07/12/2019 showed lymphadenopathy anterior to the carina, AP window, pretracheal, right hilar and several prominent subcarinal lymph nodes, largest measuring 2.4 cm.  Lung showed mosaic attenuation at several sites. - Evaluated by Dr. Isaiah Serge on the PET scan was done on 09/07/2019.  This showed 1.2 cm prevascular lymph node, right paratracheal lymph node, subcarinal lymph node, right hilar lymph node.  Right axilla lymph node measuring 0.9 cm short axis with SUV 5.8.  There is increased uptake in the distal esophagus, nonspecific.  1.7 cm peripancreatic lymph node with SUV max of 2.68. - Right axillary lymph node biopsy on 09/21/2019 showed limited lymphoid tissue.  Lymphoid tissue primarily composed of small round to slightly irregular lymphocytes mixed with variable number of plasma cells.  No well-formed granuloma or malignancy noted. - Right axillary lymph node biopsy on 11/16/2019 shows reactive  lymphoid hyperplasia. - CT chest on 01/25/2020 showed persistent but stable appearance of mediastinal and right hilar adenopathy.  Subtle perifissural micronodularity is suspected.  Mild mosaic attenuation pattern which may be seen with small airway disease. - CT chest from 01/28/2022 which showed multiple mediastinal lymph nodes, largest measuring 1.5 cm in short axis.  Overall they have decreased in size compared to prior scan from June 2022.  Axillary lymphadenopathy has resolved. - She is a former smoker, smoked 0.5 PPD x 28 years, quit January 2023 - She does not have any B symptoms or infections. - No palpable adenopathy on physical exam.   - Labs (02/02/2023) reviewed showed normal LFTs, CBC, and LDH. - PLAN: RTC in 1 year for follow-up with labs and physical exam. - Will consider repeat imaging only if she develops B symptoms or lab abnormalities.  PLAN SUMMARY: >> Labs in 1 year = CBC/D, CMP, LDH >> OFFICE visit in 1 year     REVIEW OF SYSTEMS:   Review of Systems  Constitutional:  Positive for fatigue. Negative for appetite change, chills, diaphoresis, fever and unexpected weight change.  HENT:   Negative for lump/mass and nosebleeds.   Eyes:  Negative for eye problems.  Respiratory:  Negative for cough, hemoptysis and shortness of breath.   Cardiovascular:  Negative for chest pain, leg swelling and palpitations.  Gastrointestinal:  Negative for abdominal pain, blood in stool, constipation, diarrhea, nausea and vomiting.  Genitourinary:  Negative for hematuria.   Skin: Negative.   Neurological:  Positive for numbness (Right foot). Negative for dizziness, headaches and light-headedness.  Hematological:  Does not bruise/bleed easily.  Psychiatric/Behavioral:  Positive for sleep disturbance.      PHYSICAL EXAM:  ECOG PERFORMANCE STATUS: 1 - Symptomatic but completely ambulatory  There were no vitals filed for this visit. There were no vitals filed for this visit. Physical  Exam Constitutional:      Appearance: Normal appearance. She is obese.  Cardiovascular:     Heart sounds: Normal heart sounds.  Pulmonary:     Breath sounds: Normal breath sounds.  Lymphadenopathy:     Head:     Right side of head: No submental, submandibular, tonsillar, preauricular, posterior auricular or occipital adenopathy.     Left side of head: No submental, submandibular, tonsillar, preauricular, posterior auricular or occipital adenopathy.     Cervical: No cervical adenopathy.     Right cervical: No superficial, deep or posterior cervical adenopathy.    Left cervical: No superficial, deep or posterior cervical adenopathy.     Upper Body:     Right upper body: No supraclavicular, axillary, pectoral or epitrochlear adenopathy.     Left upper body: No supraclavicular, axillary, pectoral or epitrochlear adenopathy.     Lower Body: No right inguinal adenopathy. No left inguinal adenopathy.  Neurological:     General: No focal deficit present.     Mental Status: Mental status is at baseline.  Psychiatric:        Behavior: Behavior normal. Behavior is cooperative.    PAST MEDICAL/SURGICAL HISTORY:  Past Medical History:  Diagnosis Date   Acute respiratory failure with hypoxia (HCC) 10/26/2018   Arthritis    Difficult intubation    GERD (gastroesophageal reflux disease)    HLD (hyperlipidemia)    Hypertension    Influenza A 10/26/2018   Pneumonia 10/26/2018   Past Surgical History:  Procedure Laterality Date   AXILLARY LYMPH NODE BIOPSY Right 11/16/2019   Procedure: AXILLARY LYMPH NODE BIOPSY;  Surgeon: Franky Macho, MD;  Location: [facility];  Service: General;  Laterality: Right;   FINGER SURGERY Right 1995   [place]   LUMBAR SPINE SURGERY      SOCIAL HISTORY:  Social History   Socioeconomic History   Marital status: Married    Spouse name: Earvin Hansen   Number of children: 1   Years of education: Not on file   Highest education level: Not on file  Occupational History    Occupation: CNA  Tobacco Use   Smoking status: Former    Packs/day: 0.50    Years: 30.00    Additional pack years: 0.00    Total pack years: 15.00    Types: Cigarettes    Quit date: 11/16/2020    Years since quitting: 2.2   Smokeless tobacco: Never  Vaping Use   Vaping Use: Some days  Substance and Sexual Activity   Alcohol use: Yes    Comment: ocassional on the weekends   Drug use: No   Sexual activity: Not Currently  Other Topics Concern   Not on file  Social History Narrative   Not on file   Social Determinants of Health   Financial Resource Strain: Low Risk  (10/18/2019)   Overall Financial Resource Strain (CARDIA)    Difficulty of Paying Living Expenses: Not hard at all  Food Insecurity: No Food Insecurity (10/18/2019)   Hunger Vital Sign    Worried About Running Out of Food in the Last Year: Never true    Ran Out of Food in the Last Year: Never true  Transportation Needs: No Transportation Needs (10/18/2019)   PRAPARE - Transportation  Lack of Transportation (Medical): No    Lack of Transportation (Non-Medical): No  Physical Activity: Inactive (10/18/2019)   Exercise Vital Sign    Days of Exercise per Week: 0 days    Minutes of Exercise per Session: 0 min  Stress: No Stress Concern Present (10/18/2019)   [employer] of Occupational Health - Occupational Stress Questionnaire    Feeling of Stress : Not at all  Social Connections: Moderately Integrated (10/18/2019)   Social Connection and Isolation Panel [NHANES]    Frequency of Communication with Friends and Family: Three times a week    Frequency of Social Gatherings with Friends and Family: Three times a week    Attends Religious Services: More than 4 times per year    Active Member of Clubs or Organizations: No    Attends Banker Meetings: Never    Marital Status: Married  Catering manager Violence: Not At Risk (10/18/2019)   Humiliation, Afraid, Rape, and Kick questionnaire    Fear of Current or  Ex-Partner: No    Emotionally Abused: No    Physically Abused: No    Sexually Abused: No    FAMILY HISTORY:  Family History  Problem Relation Age of Onset   Hypertension Mother    Alzheimer's disease Mother    Diabetes Mother    Heart disease Father    Hypertension Father    Hypertension Sister    Heart disease Sister    Heart disease Sister    Hypertension Sister    Colon cancer Neg Hx    Esophageal cancer Neg Hx    Stomach cancer Neg Hx    Colon polyps Neg Hx     CURRENT MEDICATIONS:  Outpatient Encounter Medications as of 02/02/2023  Medication Sig   albuterol (PROVENTIL HFA;VENTOLIN HFA) 108 (90 Base) MCG/ACT inhaler Inhale 2 puffs into the lungs every 4 (four) hours as needed for wheezing or shortness of breath.   amitriptyline (ELAVIL) 100 MG tablet TAKE 1 TABLET (100 MG TOTAL) BY MOUTH AT BEDTIME. (Patient taking differently: Take 200 mg by mouth at bedtime.)   amLODipine (NORVASC) 10 MG tablet Take 1 tablet (10 mg total) by mouth daily.   aspirin 81 MG tablet Take 81 mg by mouth daily.   cholecalciferol (VITAMIN D3) 25 MCG (1000 UNIT) tablet Take 2,000 Units by mouth daily.   enalapril (VASOTEC) 20 MG tablet Take 20 mg by mouth daily.   hydrochlorothiazide (HYDRODIURIL) 25 MG tablet Take 25 mg by mouth daily.   ibuprofen (ADVIL) 600 MG tablet Take 600 mg by mouth every 6 (six) hours as needed for moderate pain.   metoprolol (LOPRESSOR) 50 MG tablet TAKE 1 TABLET BY MOUTH TWICE A DAY (Patient taking differently: Take 50 mg by mouth 2 (two) times daily.)   naproxen (NAPROSYN) 500 MG tablet Take 1 tablet (500 mg total) by mouth 2 (two) times daily with a meal.   omeprazole (PRILOSEC) 20 MG capsule Take 1 capsule (20 mg total) by mouth daily.   ondansetron (ZOFRAN) 4 MG tablet Take 1 tablet (4 mg total) by mouth every 8 (eight) hours as needed.   pravastatin (PRAVACHOL) 80 MG tablet Take 80 mg by mouth daily.   No facility-administered encounter medications on file as of  02/02/2023.    ALLERGIES:  No Known Allergies  LABORATORY DATA:  I have reviewed the labs as listed.  CBC    Component Value Date/Time   WBC 7.8 01/28/2022 0906   RBC 4.05 01/28/2022 0906  HGB 12.8 01/28/2022 0906   HCT 38.5 01/28/2022 0906   PLT 272 01/28/2022 0906   MCV 95.1 01/28/2022 0906   MCH 31.6 01/28/2022 0906   MCHC 33.2 01/28/2022 0906   RDW 14.0 01/28/2022 0906   LYMPHSABS 1.9 01/28/2022 0906   MONOABS 0.5 01/28/2022 0906   EOSABS 0.1 01/28/2022 0906   BASOSABS 0.0 01/28/2022 0906      Latest Ref Rng & Units 01/28/2022    9:06 AM 01/28/2021    8:56 AM 01/21/2021   10:27 AM  CMP  Glucose 70 - 99 mg/dL 782  99  86   BUN 6 - 20 mg/dL 18  15  11    Creatinine 0.44 - 1.00 mg/dL 9.56  2.13  0.86   Sodium 135 - 145 mmol/L 137  135  137   Potassium 3.5 - 5.1 mmol/L 3.6  4.0  3.4   Chloride 98 - 111 mmol/L 107  105  106   CO2 22 - 32 mmol/L 23  24  24    Calcium 8.9 - 10.3 mg/dL 8.7  9.1  8.7   Total Protein 6.5 - 8.1 g/dL 7.5  8.0    Total Bilirubin 0.3 - 1.2 mg/dL 0.7  0.5    Alkaline Phos 38 - 126 U/L 74  80    AST 15 - 41 U/L 22  23    ALT 0 - 44 U/L 22  26      DIAGNOSTIC IMAGING:  I have independently reviewed the relevant imaging and discussed with the patient.   WRAP UP:  All questions were answered. The patient knows to call the clinic with any problems, questions or concerns.  Medical decision making: Low  Time spent on visit: I spent 15 minutes counseling the patient face to face. The total time spent in the appointment was 22 minutes and more than 50% was on counseling.  Carnella Guadalajara, PA-C  02/02/2023 10:13 AM

## 2023-02-02 ENCOUNTER — Inpatient Hospital Stay: Payer: Commercial Managed Care - PPO | Attending: Physician Assistant

## 2023-02-02 ENCOUNTER — Inpatient Hospital Stay (HOSPITAL_BASED_OUTPATIENT_CLINIC_OR_DEPARTMENT_OTHER): Payer: Commercial Managed Care - PPO | Admitting: Physician Assistant

## 2023-02-02 VITALS — BP 115/71 | HR 85 | Temp 97.9°F | Resp 16 | Wt 194.9 lb

## 2023-02-02 DIAGNOSIS — R590 Localized enlarged lymph nodes: Secondary | ICD-10-CM | POA: Insufficient documentation

## 2023-02-02 DIAGNOSIS — R591 Generalized enlarged lymph nodes: Secondary | ICD-10-CM | POA: Diagnosis not present

## 2023-02-02 DIAGNOSIS — Z87891 Personal history of nicotine dependence: Secondary | ICD-10-CM | POA: Insufficient documentation

## 2023-02-02 LAB — CBC WITH DIFFERENTIAL/PLATELET
Abs Immature Granulocytes: 0.02 10*3/uL (ref 0.00–0.07)
Basophils Absolute: 0 10*3/uL (ref 0.0–0.1)
Basophils Relative: 0 %
Eosinophils Absolute: 0.1 10*3/uL (ref 0.0–0.5)
Eosinophils Relative: 2 %
HCT: 42.2 % (ref 36.0–46.0)
Hemoglobin: 14.2 g/dL (ref 12.0–15.0)
Immature Granulocytes: 0 %
Lymphocytes Relative: 35 %
Lymphs Abs: 2.6 10*3/uL (ref 0.7–4.0)
MCH: 31 pg (ref 26.0–34.0)
MCHC: 33.6 g/dL (ref 30.0–36.0)
MCV: 92.1 fL (ref 80.0–100.0)
Monocytes Absolute: 0.6 10*3/uL (ref 0.1–1.0)
Monocytes Relative: 9 %
Neutro Abs: 4 10*3/uL (ref 1.7–7.7)
Neutrophils Relative %: 54 %
Platelets: 234 10*3/uL (ref 150–400)
RBC: 4.58 MIL/uL (ref 3.87–5.11)
RDW: 14.5 % (ref 11.5–15.5)
WBC: 7.4 10*3/uL (ref 4.0–10.5)
nRBC: 0 % (ref 0.0–0.2)

## 2023-02-02 LAB — COMPREHENSIVE METABOLIC PANEL
ALT: 22 U/L (ref 0–44)
AST: 25 U/L (ref 15–41)
Albumin: 4 g/dL (ref 3.5–5.0)
Alkaline Phosphatase: 74 U/L (ref 38–126)
Anion gap: 10 (ref 5–15)
BUN: 16 mg/dL (ref 6–20)
CO2: 24 mmol/L (ref 22–32)
Calcium: 9 mg/dL (ref 8.9–10.3)
Chloride: 101 mmol/L (ref 98–111)
Creatinine, Ser: 0.78 mg/dL (ref 0.44–1.00)
GFR, Estimated: >60 mL/min (ref >60)
Glucose, Bld: 114 mg/dL — ABNORMAL HIGH (ref 70–99)
Potassium: 3.3 mmol/L — ABNORMAL LOW (ref 3.5–5.1)
Sodium: 135 mmol/L (ref 135–145)
Total Bilirubin: 0.4 mg/dL (ref 0.3–1.2)
Total Protein: 7.4 g/dL (ref 6.5–8.1)

## 2023-02-02 LAB — LACTATE DEHYDROGENASE: LDH: 190 U/L (ref 98–192)

## 2023-02-16 ENCOUNTER — Encounter: Payer: Commercial Managed Care - PPO | Admitting: Podiatry

## 2023-05-29 ENCOUNTER — Other Ambulatory Visit: Payer: Self-pay | Admitting: Family Medicine

## 2023-05-29 DIAGNOSIS — Z1231 Encounter for screening mammogram for malignant neoplasm of breast: Secondary | ICD-10-CM

## 2023-06-29 ENCOUNTER — Encounter (HOSPITAL_COMMUNITY): Payer: Commercial Managed Care - PPO

## 2023-06-29 DIAGNOSIS — Z1231 Encounter for screening mammogram for malignant neoplasm of breast: Secondary | ICD-10-CM

## 2023-07-09 ENCOUNTER — Encounter (HOSPITAL_COMMUNITY): Payer: Self-pay

## 2023-07-09 ENCOUNTER — Ambulatory Visit (HOSPITAL_COMMUNITY)
Admission: RE | Admit: 2023-07-09 | Discharge: 2023-07-09 | Disposition: A | Discharge status: Home / Self Care | Payer: Commercial Managed Care - PPO | Source: Ambulatory Visit | Attending: Family Medicine | Admitting: Family Medicine

## 2023-07-09 DIAGNOSIS — Z1231 Encounter for screening mammogram for malignant neoplasm of breast: Secondary | ICD-10-CM | POA: Insufficient documentation

## 2024-01-26 ENCOUNTER — Inpatient Hospital Stay: Payer: Commercial Managed Care - PPO

## 2024-01-27 ENCOUNTER — Inpatient Hospital Stay: Attending: Hematology

## 2024-01-27 DIAGNOSIS — R590 Localized enlarged lymph nodes: Secondary | ICD-10-CM | POA: Insufficient documentation

## 2024-01-27 DIAGNOSIS — Z87891 Personal history of nicotine dependence: Secondary | ICD-10-CM | POA: Insufficient documentation

## 2024-01-27 DIAGNOSIS — R591 Generalized enlarged lymph nodes: Secondary | ICD-10-CM

## 2024-01-27 DIAGNOSIS — Z79899 Other long term (current) drug therapy: Secondary | ICD-10-CM | POA: Insufficient documentation

## 2024-01-27 LAB — CBC WITH DIFFERENTIAL/PLATELET
Abs Immature Granulocytes: 0.02 10*3/uL (ref 0.00–0.07)
Basophils Absolute: 0 10*3/uL (ref 0.0–0.1)
Basophils Relative: 1 %
Eosinophils Absolute: 0.1 10*3/uL (ref 0.0–0.5)
Eosinophils Relative: 2 %
HCT: 41.9 % (ref 36.0–46.0)
Hemoglobin: 14.3 g/dL (ref 12.0–15.0)
Immature Granulocytes: 0 %
Lymphocytes Relative: 24 %
Lymphs Abs: 1.9 10*3/uL (ref 0.7–4.0)
MCH: 31.5 pg (ref 26.0–34.0)
MCHC: 34.1 g/dL (ref 30.0–36.0)
MCV: 92.3 fL (ref 80.0–100.0)
Monocytes Absolute: 0.7 10*3/uL (ref 0.1–1.0)
Monocytes Relative: 9 %
Neutro Abs: 5 10*3/uL (ref 1.7–7.7)
Neutrophils Relative %: 64 %
Platelets: 254 10*3/uL (ref 150–400)
RBC: 4.54 MIL/uL (ref 3.87–5.11)
RDW: 14 % (ref 11.5–15.5)
WBC: 7.7 10*3/uL (ref 4.0–10.5)
nRBC: 0 % (ref 0.0–0.2)

## 2024-01-27 LAB — COMPREHENSIVE METABOLIC PANEL WITH GFR
ALT: 29 U/L (ref 0–44)
AST: 24 U/L (ref 15–41)
Albumin: 3.8 g/dL (ref 3.5–5.0)
Alkaline Phosphatase: 73 U/L (ref 38–126)
Anion gap: 12 (ref 5–15)
BUN: 16 mg/dL (ref 6–20)
CO2: 23 mmol/L (ref 22–32)
Calcium: 9.2 mg/dL (ref 8.9–10.3)
Chloride: 105 mmol/L (ref 98–111)
Creatinine, Ser: 0.77 mg/dL (ref 0.44–1.00)
GFR, Estimated: >60 mL/min (ref >60)
Glucose, Bld: 106 mg/dL — ABNORMAL HIGH (ref 70–99)
Potassium: 3.4 mmol/L — ABNORMAL LOW (ref 3.5–5.1)
Sodium: 140 mmol/L (ref 135–145)
Total Bilirubin: 0.7 mg/dL (ref 0.0–1.2)
Total Protein: 7.7 g/dL (ref 6.5–8.1)

## 2024-01-27 LAB — LACTATE DEHYDROGENASE: LDH: 252 U/L — ABNORMAL HIGH (ref 98–192)

## 2024-02-01 NOTE — Progress Notes (Signed)
 [facility] [address], [place]   CLINIC:  Medical Oncology/Hematology  PCP:  Rollene Therisa BRAVO, FNP (Inactive) [address] [place] [address] [phone]   REASON FOR VISIT:  Follow-up for mediastinal and right axillary adenopathy  PRIOR THERAPY: None  CURRENT THERAPY: Surveillance  INTERVAL HISTORY:   Donna Fitzgerald 58 y.o. female returns for routine follow-up of her mediastinal and right axillary adenopathy.  She was last seen by Pleasant Barefoot PA-C on 02/02/2023.  At today's visit, she reports feeling fairly well apart from some back pain and knee pain. She quit smoking in January 2023 and has continued to abstain from cigarettes. She denies any recent infections or antibiotics. She denies any B symptoms such as fever, chills, night sweats, unintentional weight loss. She has not noticed any new masses or lymphadenopathy. She has 40% energy and 100% appetite. She endorses that she is maintaining a stable weight.  ASSESSMENT & PLAN:  1.  Mediastinal and right axillary adenopathy: - CT chest PE protocol on 07/12/2019 showed lymphadenopathy anterior to the carina, AP window, pretracheal, right hilar and several prominent subcarinal lymph nodes, largest measuring 2.4 cm.  Lung showed mosaic attenuation at several sites. - Evaluated by Dr. Theophilus on the PET scan was done on 09/07/2019.  This showed 1.2 cm prevascular lymph node, right paratracheal lymph node, subcarinal lymph node, right hilar lymph node.  Right axilla lymph node measuring 0.9 cm short axis with SUV 5.8.  There is increased uptake in the distal esophagus, nonspecific.  1.7 cm peripancreatic lymph node with SUV max of 2.68. - Right axillary lymph node biopsy on 09/21/2019 showed limited lymphoid tissue.  Lymphoid tissue primarily composed of small round to slightly irregular lymphocytes mixed with variable number of plasma cells.  No well-formed granuloma or malignancy  noted. - Right axillary lymph node biopsy on 11/16/2019 shows reactive lymphoid hyperplasia. - CT chest on 01/25/2020 showed persistent but stable appearance of mediastinal and right hilar adenopathy.  Subtle perifissural micronodularity is suspected.  Mild m.osaic attenuation pattern which may be seen with small airway disease. - CT chest from 01/28/2022 which showed multiple mediastinal lymph nodes, largest measuring 1.5 cm in short axis.  Overall they have decreased in size compared to prior scan from June 2022.  Axillary lymphadenopathy has resolved. - She is a former smoker, smoked 0.5 PPD x 28 years, quit January 2023 - She does not have any B symptoms or infections. - PHYSICAL EXAM (02/02/2024): No palpable adenopathy on physical exam.   - Labs (01/27/2024) reviewed showed normal LFTs, CBC.  Mildly elevated LDH 252. - PLAN: RTC in 1 year for follow-up with labs and physical exam. - Will consider repeat imaging only if she develops B symptoms or lab abnormalities.  PLAN SUMMARY: >> Labs in 1 year = CBC/D, CMP, LDH >> OFFICE visit in 1 year     REVIEW OF SYSTEMS:  Review of Systems  Constitutional:  Positive for fatigue. Negative for appetite change, chills, diaphoresis, fever and unexpected weight change.  HENT:   Negative for lump/mass and nosebleeds.   Eyes:  Negative for eye problems.  Respiratory:  Negative for cough, hemoptysis and shortness of breath.   Cardiovascular:  Negative for chest pain, leg swelling and palpitations.  Gastrointestinal:  Negative for abdominal pain, blood in stool, constipation, diarrhea, nausea and vomiting.  Genitourinary:  Negative for hematuria.   Musculoskeletal:  Positive for arthralgias and back pain.  Skin: Negative.   Neurological:  Positive for numbness (Right foot). Negative for dizziness, headaches and light-headedness.  Hematological:  Does not bruise/bleed easily.  Psychiatric/Behavioral:  Negative for sleep disturbance.      PHYSICAL EXAM:   ECOG PERFORMANCE STATUS: 1 - Symptomatic but completely ambulatory  Vitals:   02/02/24 0933  BP: 121/74  Pulse: 85  Resp: 18  Temp: 98.2 F (36.8 C)  SpO2: 98%   Filed Weights   02/02/24 0933  Weight: 204 lb 1.6 oz (92.6 kg)   Physical Exam Constitutional:      Appearance: Normal appearance. She is obese.   Cardiovascular:     Heart sounds: Normal heart sounds.  Pulmonary:     Breath sounds: Normal breath sounds.  Lymphadenopathy:     Head:     Right side of head: No submental, submandibular, tonsillar, preauricular, posterior auricular or occipital adenopathy.     Left side of head: No submental, submandibular, tonsillar, preauricular, posterior auricular or occipital adenopathy.     Cervical: No cervical adenopathy.     Right cervical: No superficial, deep or posterior cervical adenopathy.    Left cervical: No superficial, deep or posterior cervical adenopathy.     Upper Body:     Right upper body: No supraclavicular, axillary, pectoral or epitrochlear adenopathy.     Left upper body: No supraclavicular, axillary, pectoral or epitrochlear adenopathy.     Lower Body: No right inguinal adenopathy. No left inguinal adenopathy.   Neurological:     General: No focal deficit present.     Mental Status: Mental status is at baseline.   Psychiatric:        Behavior: Behavior normal. Behavior is cooperative.    PAST MEDICAL/SURGICAL HISTORY:  Past Medical History:  Diagnosis Date   Acute respiratory failure with hypoxia (HCC) 10/26/2018   Arthritis    Difficult intubation    GERD (gastroesophageal reflux disease)    HLD (hyperlipidemia)    Hypertension    Influenza A 10/26/2018   Pneumonia 10/26/2018   Past Surgical History:  Procedure Laterality Date   AXILLARY LYMPH NODE BIOPSY Right 11/16/2019   Procedure: AXILLARY LYMPH NODE BIOPSY;  Surgeon: Mavis Anes, MD;  Location: [facility];  Service: General;  Laterality: Right;   FINGER SURGERY Right 1995   [place]   LUMBAR  SPINE SURGERY      SOCIAL HISTORY:  Social History   Socioeconomic History   Marital status: Married    Spouse name: Elna   Number of children: 1   Years of education: Not on file   Highest education level: Not on file  Occupational History   Occupation: CNA  Tobacco Use   Smoking status: Former    Current packs/day: 0.00    Average packs/day: 0.5 packs/day for 30.0 years (15.0 ttl pk-yrs)    Types: Cigarettes    Start date: 11/17/1990    Quit date: 11/16/2020    Years since quitting: 3.2   Smokeless tobacco: Never  Vaping Use   Vaping status: Some Days  Substance and Sexual Activity   Alcohol use: Yes    Comment: ocassional on the weekends   Drug use: No   Sexual activity: Not Currently  Other Topics Concern   Not on file  Social History Narrative   Not on file   Social Drivers of Health   Financial Resource Strain: Low Risk  (10/18/2019)   Overall Financial Resource Strain (CARDIA)    Difficulty of Paying Living Expenses: Not hard at all  Food Insecurity: No Food  Insecurity (10/18/2019)   Hunger Vital Sign    Worried About Running Out of Food in the Last Year: Never true    Ran Out of Food in the Last Year: Never true  Transportation Needs: No Transportation Needs (10/18/2019)   PRAPARE - Administrator, Civil Service (Medical): No    Lack of Transportation (Non-Medical): No  Physical Activity: Inactive (10/18/2019)   Exercise Vital Sign    Days of Exercise per Week: 0 days    Minutes of Exercise per Session: 0 min  Stress: No Stress Concern Present (10/18/2019)   [employer] of Occupational Health - Occupational Stress Questionnaire    Feeling of Stress : Not at all  Social Connections: Moderately Integrated (10/18/2019)   Social Connection and Isolation Panel    Frequency of Communication with Friends and Family: Three times a week    Frequency of Social Gatherings with Friends and Family: Three times a week    Attends Religious Services: More than 4  times per year    Active Member of Clubs or Organizations: No    Attends Banker Meetings: Never    Marital Status: Married  Catering manager Violence: Not At Risk (10/18/2019)   Humiliation, Afraid, Rape, and Kick questionnaire    Fear of Current or Ex-Partner: No    Emotionally Abused: No    Physically Abused: No    Sexually Abused: No    FAMILY HISTORY:  Family History  Problem Relation Age of Onset   Hypertension Mother    Alzheimer's disease Mother    Diabetes Mother    Heart disease Father    Hypertension Father    Hypertension Sister    Heart disease Sister    Heart disease Sister    Hypertension Sister    Colon cancer Neg Hx    Esophageal cancer Neg Hx    Stomach cancer Neg Hx    Colon polyps Neg Hx     CURRENT MEDICATIONS:  Outpatient Encounter Medications as of 02/02/2024  Medication Sig   albuterol  (PROVENTIL  HFA;VENTOLIN  HFA) 108 (90 Base) MCG/ACT inhaler Inhale 2 puffs into the lungs every 4 (four) hours as needed for wheezing or shortness of breath.   amitriptyline  (ELAVIL ) 100 MG tablet TAKE 1 TABLET (100 MG TOTAL) BY MOUTH AT BEDTIME. (Patient taking differently: Take 200 mg by mouth at bedtime.)   amLODipine  (NORVASC ) 10 MG tablet Take 1 tablet (10 mg total) by mouth daily.   aspirin  81 MG tablet Take 81 mg by mouth daily.   cholecalciferol (VITAMIN D3) 25 MCG (1000 UNIT) tablet Take 2,000 Units by mouth daily.   enalapril  (VASOTEC ) 20 MG tablet Take 20 mg by mouth daily.   hydrochlorothiazide (HYDRODIURIL) 25 MG tablet Take 25 mg by mouth daily.   ibuprofen  (ADVIL ) 600 MG tablet Take 600 mg by mouth every 6 (six) hours as needed for moderate pain.   metoprolol  (LOPRESSOR ) 50 MG tablet TAKE 1 TABLET BY MOUTH TWICE A DAY (Patient taking differently: Take 50 mg by mouth 2 (two) times daily.)   naproxen  (NAPROSYN ) 500 MG tablet Take 1 tablet (500 mg total) by mouth 2 (two) times daily with a meal.   omeprazole  (PRILOSEC) 20 MG capsule Take 1 capsule  (20 mg total) by mouth daily.   ondansetron  (ZOFRAN ) 4 MG tablet Take 1 tablet (4 mg total) by mouth every 8 (eight) hours as needed.   pravastatin  (PRAVACHOL ) 80 MG tablet Take 80 mg by mouth daily.   No  facility-administered encounter medications on file as of 02/02/2024.    ALLERGIES:  No Known Allergies  LABORATORY DATA:  I have reviewed the labs as listed.  CBC    Component Value Date/Time   WBC 7.7 01/27/2024 0820   RBC 4.54 01/27/2024 0820   HGB 14.3 01/27/2024 0820   HCT 41.9 01/27/2024 0820   PLT 254 01/27/2024 0820   MCV 92.3 01/27/2024 0820   MCH 31.5 01/27/2024 0820   MCHC 34.1 01/27/2024 0820   RDW 14.0 01/27/2024 0820   LYMPHSABS 1.9 01/27/2024 0820   MONOABS 0.7 01/27/2024 0820   EOSABS 0.1 01/27/2024 0820   BASOSABS 0.0 01/27/2024 0820      Latest Ref Rng & Units 01/27/2024    8:20 AM 02/02/2023    8:29 AM 01/28/2022    9:06 AM  CMP  Glucose 70 - 99 mg/dL 893  885  842   BUN 6 - 20 mg/dL 16  16  18    Creatinine 0.44 - 1.00 mg/dL 9.22  9.21  9.18   Sodium 135 - 145 mmol/L 140  135  137   Potassium 3.5 - 5.1 mmol/L 3.4  3.3  3.6   Chloride 98 - 111 mmol/L 105  101  107   CO2 22 - 32 mmol/L 23  24  23    Calcium 8.9 - 10.3 mg/dL 9.2  9.0  8.7   Total Protein 6.5 - 8.1 g/dL 7.7  7.4  7.5   Total Bilirubin 0.0 - 1.2 mg/dL 0.7  0.4  0.7   Alkaline Phos 38 - 126 U/L 73  74  74   AST 15 - 41 U/L 24  25  22    ALT 0 - 44 U/L 29  22  22      DIAGNOSTIC IMAGING:  I have independently reviewed the relevant imaging and discussed with the patient.   WRAP UP:  All questions were answered. The patient knows to call the clinic with any problems, questions or concerns.  Medical decision making: Low  Time spent on visit: I spent 15 minutes counseling the patient face to face. The total time spent in the appointment was 22 minutes and more than 50% was on counseling.  Pleasant CHRISTELLA Barefoot, PA-C  02/02/24 10:01 AM

## 2024-02-02 ENCOUNTER — Inpatient Hospital Stay (HOSPITAL_BASED_OUTPATIENT_CLINIC_OR_DEPARTMENT_OTHER): Payer: Commercial Managed Care - PPO | Admitting: Physician Assistant

## 2024-02-02 DIAGNOSIS — R590 Localized enlarged lymph nodes: Secondary | ICD-10-CM | POA: Diagnosis not present

## 2024-02-02 DIAGNOSIS — R591 Generalized enlarged lymph nodes: Secondary | ICD-10-CM

## 2024-02-02 NOTE — Patient Instructions (Signed)
 [facility] at [facility] **VISIT SUMMARY & IMPORTANT INSTRUCTIONS **   You were seen today by Sheril Dines PA-C for your history of enlarged lymph nodes.   Your most recent labs were overall normal. You did not have any evidence of swollen lymph nodes on your exam today. We will continue surveillance with follow-up visit in 1 year. Please contact us  sooner if you have any of the following: New onset swollen lymph nodes in your head, neck, armpits, or groin. Unexplained and unintentional weight loss. Drenching night sweats. Unexplained fevers or chills.  FOLLOW-UP APPOINTMENT: 1 year  ** Thank you for trusting me with your healthcare!  I strive to provide all of my patients with quality care at each visit.  If you receive a survey for this visit, I would be so grateful to you for taking the time to provide feedback.  Thank you in advance!  ~ Kaedin Hicklin                   Dr. Paulett Boros   &   Sheril Dines, PA-C   - - - - - - - - - - - - - - - - - -    Thank you for choosing [facility] at [facility] to provide your oncology and hematology care.  To afford each patient quality time with our provider, please arrive at least 15 minutes before your scheduled appointment time.   If you have a lab appointment with the Cancer Center please come in thru the Main Entrance and check in at the main information desk.  You need to re-schedule your appointment should you arrive 10 or more minutes late.  We strive to give you quality time with our providers, and arriving late affects you and other patients whose appointments are after yours.  Also, if you no show three or more times for appointments you may be dismissed from the clinic at the providers discretion.     Again, thank you for choosing [facility].  Our hope is that these requests will decrease the amount of time that you wait before being seen by our physicians.        _____________________________________________________________  Should you have questions after your visit to [facility], please contact our office at [phone] and follow the prompts.  Our office hours are 8:00 a.m. and 4:30 p.m. Monday - Friday.  Please note that voicemails left after 4:00 p.m. may not be returned until the following business day.  We are closed weekends and major holidays.  You do have access to a nurse 24-7, just call the main number to the clinic [phone] and do not press any options, hold on the line and a nurse will answer the phone.    For prescription refill requests, have your pharmacy contact our office and allow 72 hours.

## 2024-08-04 ENCOUNTER — Emergency Department

## 2024-08-04 ENCOUNTER — Emergency Department
Admission: EM | Admit: 2024-08-04 | Discharge: 2024-08-04 | Disposition: A | Discharge status: Home / Self Care | Source: Home / Self Care | Attending: Emergency Medicine | Admitting: Emergency Medicine

## 2024-08-04 ENCOUNTER — Other Ambulatory Visit: Payer: Self-pay

## 2024-08-04 DIAGNOSIS — S60551A Superficial foreign body of right hand, initial encounter: Secondary | ICD-10-CM | POA: Diagnosis present

## 2024-08-04 DIAGNOSIS — I10 Essential (primary) hypertension: Secondary | ICD-10-CM | POA: Insufficient documentation

## 2024-08-04 DIAGNOSIS — W458XXA Other foreign body or object entering through skin, initial encounter: Secondary | ICD-10-CM | POA: Insufficient documentation

## 2024-08-04 DIAGNOSIS — L03011 Cellulitis of right finger: Secondary | ICD-10-CM | POA: Insufficient documentation

## 2024-08-04 DIAGNOSIS — Z23 Encounter for immunization: Secondary | ICD-10-CM | POA: Diagnosis not present

## 2024-08-04 MED ORDER — SULFAMETHOXAZOLE-TRIMETHOPRIM 800-160 MG PO TABS: 1.0000 | ORAL_TABLET | Freq: Two times a day (BID) | ORAL | 0 refills | Status: AC

## 2024-08-04 MED ORDER — SULFAMETHOXAZOLE-TRIMETHOPRIM 800-160 MG PO TABS
1.0000 | ORAL_TABLET | Freq: Once | ORAL | Status: AC
  Administered 2024-08-04: 17:00:00 1 via ORAL
  Filled 2024-08-04: qty 1

## 2024-08-04 MED ORDER — TETANUS-DIPHTH-ACELL PERTUSSIS 5-2-15.5 LF-MCG/0.5 IM SUSP
0.5000 mL | Freq: Once | INTRAMUSCULAR | Status: AC
  Administered 2024-08-04: 16:00:00 0.5 mL via INTRAMUSCULAR
  Filled 2024-08-04: qty 0.5

## 2024-08-04 NOTE — ED Provider Notes (Signed)
 [facility] Provider Note    Event Date/Time   First MD Initiated Contact with Patient 08/04/24 1429     (approximate)   History   Foreign Body in Skin   HPI  Donna Fitzgerald is a 58 y.o. female  with a past medical history of *** presents to the emergency department with ***    Tuesday splinter right pinky finger in dorsal aspect of hand.  Physical Exam   Triage Vital Signs: ED Triage Vitals  Encounter Vitals Group     BP 08/04/24 1216 (!) 145/72     Girls Systolic BP Percentile --      Girls Diastolic BP Percentile --      Boys Systolic BP Percentile --      Boys Diastolic BP Percentile --      Pulse Rate 08/04/24 1214 100     Resp 08/04/24 1214 18     Temp 08/04/24 1216 98.5 F (36.9 C)     Temp src --      SpO2 08/04/24 1216 98 %     Weight 08/04/24 1215 194 lb (88 kg)     Height 08/04/24 1215 5' 2 (1.575 m)     Head Circumference --      Peak Flow --      Pain Score 08/04/24 1214 8     Pain Loc --      Pain Education --      Exclude from Growth Chart --     Most recent vital signs: Vitals:   08/04/24 1214 08/04/24 1216  BP:  (!) 145/72  Pulse: 100   Resp: 18   Temp:  98.5 F (36.9 C)  SpO2:  98%    General: Awake, in no acute distress. Appears stated age. Head: Normocephalic, atraumatic. Eyes: PERRLA. EOMs intact. No scleral icterus or conjunctival injection. Ears/Nose/Throat: TMs intact b/l. Nares patent, no nasal discharge. Oropharynx moist, no erythema or exudate. Dentition intact. Neck: Supple, no lymphadenopathy, no nuchal rigidity. CV: Good peripheral perfusion. No edema. *** Respiratory:Normal respiratory effort.  No respiratory distress. *** GI: Soft, non-distended, non-tender.  MSK: Normal ROM and  *** strength in *** extremities.  Skin:Warm, dry, intact. No rashes, lesions, or ecchymosis. No cyanosis or pallor. Neurological: A&Ox4 to person, place, time, and situation. Cranial nerves II-XII intact.***  Sensation intact. Strength symmetric. No focal deficits.  Psychiatric: Mood and affect appropriate. Thought processes coherent.   ED Results / Procedures / Treatments   Labs (all labs ordered are listed, but only abnormal results are displayed) Labs Reviewed - No data to display   EKG  ***   RADIOLOGY ***   PROCEDURES:  Critical Care performed: {CriticalCareYesNo:19197::Yes, see critical care procedure note(s),No} Med Data Critical Care [email or web address] ***  Procedures   MEDICATIONS ORDERED IN ED: Medications - No data to display   IMPRESSION / MDM / ASSESSMENT AND PLAN / ED COURSE  I reviewed the triage vital signs and the nursing notes.                              Differential diagnosis includes, but is not limited to, ***  Patient's presentation is most consistent with {EM COPA:27473}  *** I independently viewed the x-ray*** and radiologist's report.  I agree with the radiologist's report ***.  {If the patient is on the monitor, remove the brackets and asterisks on the sentence below and remember to document it as a  Procedure as well. Otherwise delete the sentence below:1} {**The patient is on the cardiac monitor to evaluate for evidence of arrhythmia and/or significant heart rate changes.**} {Remember to include, when applicable, any/all of the following data: independent review of imaging independent review of labs (comment specifically on pertinent positives and negatives) review of specific prior hospitalizations, PCP/specialist notes, etc. discuss meds given and prescribed document any discussion with consultants (including hospitalists) any clinical decision tools you used and why (PECARN, NEXUS, etc.) did you consider admitting the patient? document social determinants of health affecting patient's care (homelessness, inability to follow up in a timely fashion, etc) document any pre-existing conditions increasing risk on current visit (e.g. diabetes and  HTN increasing danger of high-risk chest pain/ACS) describes what meds you gave (especially parenteral) and why any other interventions?:1}     FINAL CLINICAL IMPRESSION(S) / ED DIAGNOSES   Final diagnoses:  None     Rx / DC Orders   ED Discharge Orders     None        Note:  This document was prepared using Dragon voice recognition software and may include unintentional dictation errors.

## 2024-08-04 NOTE — Discharge Instructions (Addendum)
 You have been seen today in the Emergency Department (ED) for cellulitis, a superficial skin infection and a suspected foreign body to your right hand. Please take your antibiotics as prescribed for their ENTIRE prescribed duration.  Take Tylenol  or Motrin  as needed for pain, but only as written on the box.   Please follow up with your doctor or in the ED in 24-48 hours for recheck of your infection if you are not improving after starting antibiotics. Please also follow up with the orthopedic hand surgeon (Dr. Jackquline Barrack) listed in this paperwork as soon as possible. Call your doctor sooner or return to the ED if you develop worsening signs of infection such as: increased redness, increased pain, pus, fever, or other symptoms that concern you.

## 2024-08-04 NOTE — ED Triage Notes (Signed)
 Pt reports splinter to right hand on Tuesday and continues to have irritation. Swelling noted

## 2024-08-24 ENCOUNTER — Ambulatory Visit: Admitting: Orthopedic Surgery

## 2025-02-01 ENCOUNTER — Other Ambulatory Visit

## 2025-02-08 ENCOUNTER — Ambulatory Visit: Admitting: Physician Assistant
